# Patient Record
Sex: Female | Born: 1945 | Race: White | Hispanic: No | Marital: Married | Smoking: Current some day smoker
Health system: Southern US, Community
[De-identification: ages and names within clinical notes are randomized; demographics above are authoritative.]

## PROBLEM LIST (undated history)

## (undated) DIAGNOSIS — I639 Cerebral infarction, unspecified: Secondary | ICD-10-CM

## (undated) DIAGNOSIS — C3490 Malignant neoplasm of unspecified part of unspecified bronchus or lung: Secondary | ICD-10-CM

## (undated) DIAGNOSIS — L570 Actinic keratosis: Secondary | ICD-10-CM

## (undated) DIAGNOSIS — I10 Essential (primary) hypertension: Secondary | ICD-10-CM

## (undated) DIAGNOSIS — R569 Unspecified convulsions: Secondary | ICD-10-CM

## (undated) DIAGNOSIS — L309 Dermatitis, unspecified: Secondary | ICD-10-CM

## (undated) HISTORY — DX: Dermatitis, unspecified: L30.9

## (undated) HISTORY — DX: Unspecified convulsions: R56.9

## (undated) HISTORY — DX: Cerebral infarction, unspecified: I63.9

## (undated) HISTORY — DX: Malignant neoplasm of unspecified part of unspecified bronchus or lung: C34.90

## (undated) HISTORY — PX: CHOLECYSTECTOMY: SHX55

## (undated) HISTORY — DX: Actinic keratosis: L57.0

---

## 2009-06-28 ENCOUNTER — Observation Stay: Payer: Self-pay | Admitting: Internal Medicine

## 2009-09-11 ENCOUNTER — Ambulatory Visit: Payer: Self-pay | Admitting: Internal Medicine

## 2010-10-03 ENCOUNTER — Ambulatory Visit: Payer: Self-pay

## 2010-11-01 ENCOUNTER — Ambulatory Visit: Payer: Self-pay | Admitting: Gastroenterology

## 2011-08-27 ENCOUNTER — Ambulatory Visit: Payer: Self-pay | Admitting: Internal Medicine

## 2011-10-16 ENCOUNTER — Ambulatory Visit: Payer: Self-pay

## 2013-02-10 ENCOUNTER — Ambulatory Visit: Payer: Self-pay

## 2013-08-31 DIAGNOSIS — R300 Dysuria: Secondary | ICD-10-CM | POA: Diagnosis not present

## 2014-01-06 DIAGNOSIS — F411 Generalized anxiety disorder: Secondary | ICD-10-CM | POA: Diagnosis not present

## 2014-01-06 DIAGNOSIS — F17200 Nicotine dependence, unspecified, uncomplicated: Secondary | ICD-10-CM | POA: Diagnosis not present

## 2014-01-06 DIAGNOSIS — I10 Essential (primary) hypertension: Secondary | ICD-10-CM | POA: Diagnosis not present

## 2014-01-06 DIAGNOSIS — R5383 Other fatigue: Secondary | ICD-10-CM | POA: Diagnosis not present

## 2014-01-06 DIAGNOSIS — K589 Irritable bowel syndrome without diarrhea: Secondary | ICD-10-CM | POA: Diagnosis not present

## 2014-01-06 DIAGNOSIS — R5381 Other malaise: Secondary | ICD-10-CM | POA: Diagnosis not present

## 2014-01-07 DIAGNOSIS — Z0000 Encounter for general adult medical examination without abnormal findings: Secondary | ICD-10-CM | POA: Diagnosis not present

## 2014-01-07 DIAGNOSIS — E559 Vitamin D deficiency, unspecified: Secondary | ICD-10-CM | POA: Diagnosis not present

## 2014-01-07 DIAGNOSIS — E782 Mixed hyperlipidemia: Secondary | ICD-10-CM | POA: Diagnosis not present

## 2014-01-07 DIAGNOSIS — I10 Essential (primary) hypertension: Secondary | ICD-10-CM | POA: Diagnosis not present

## 2014-02-14 ENCOUNTER — Ambulatory Visit: Payer: Self-pay

## 2014-02-14 DIAGNOSIS — Z1231 Encounter for screening mammogram for malignant neoplasm of breast: Secondary | ICD-10-CM | POA: Diagnosis not present

## 2014-02-14 IMAGING — MG MAM DGTL SCRN MAM NO ORDER W/CAD
1 series · 5 of 5 positions shown · non-contrast
Comparison: Previous exam(s).

CLINICAL DATA: Screening.

EXAM:
DIGITAL SCREENING BILATERAL MAMMOGRAM WITH CAD

[R CC · right · 5 of 5 slices shown]
[im 1/5]
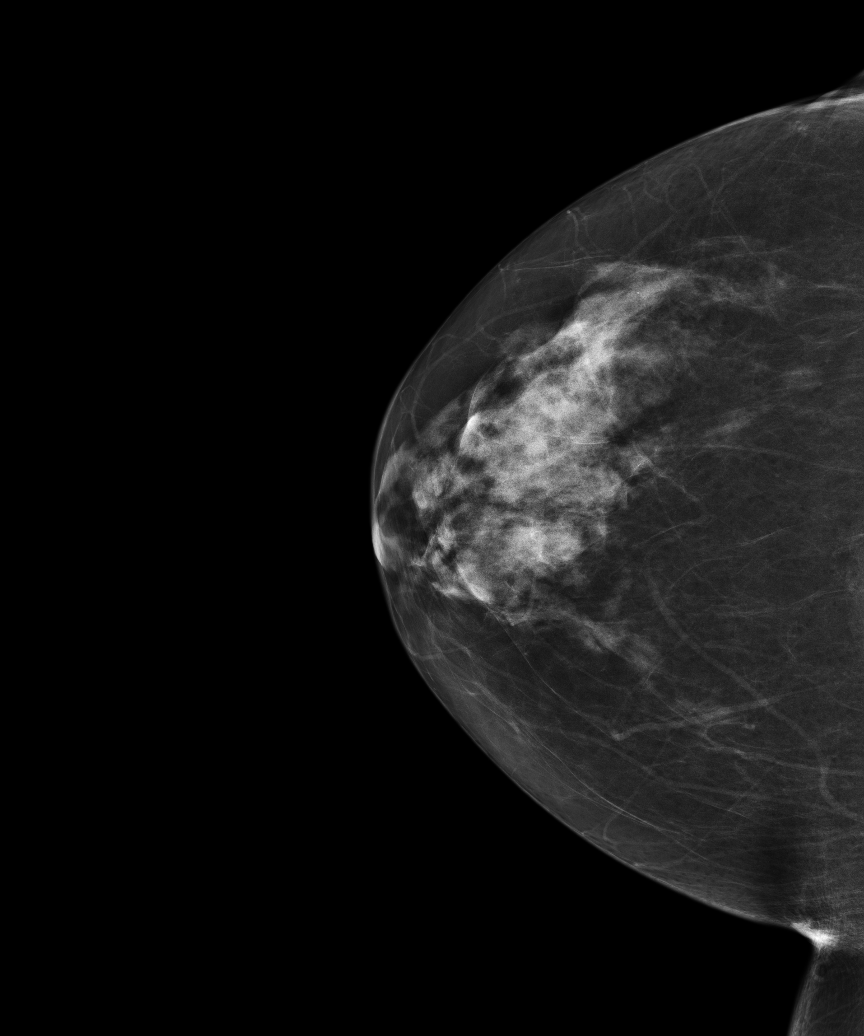
[im 2/5]
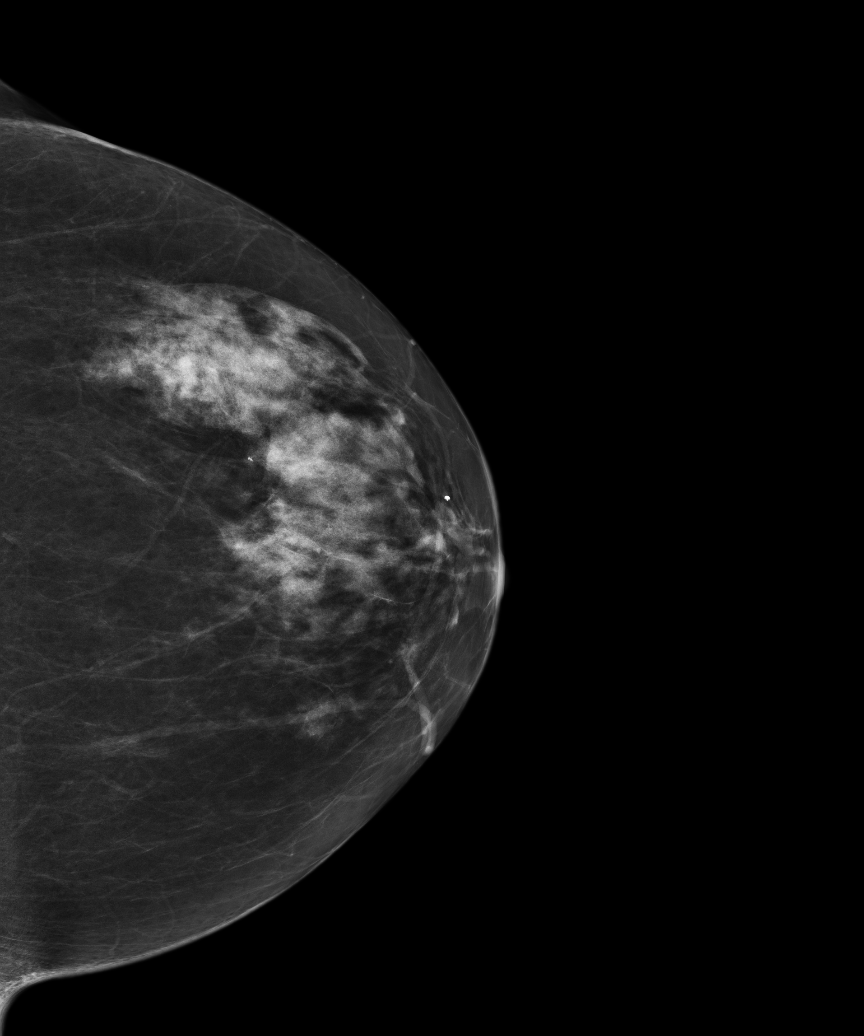
[im 3/5]
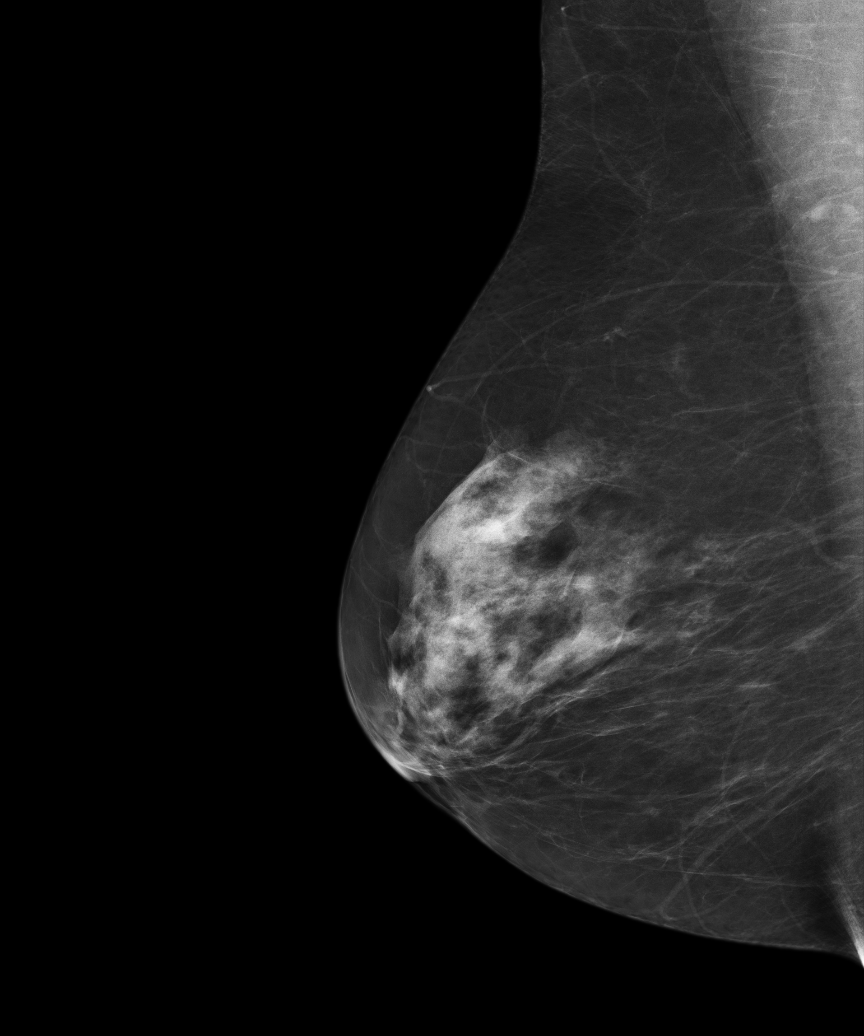
[im 4/5]
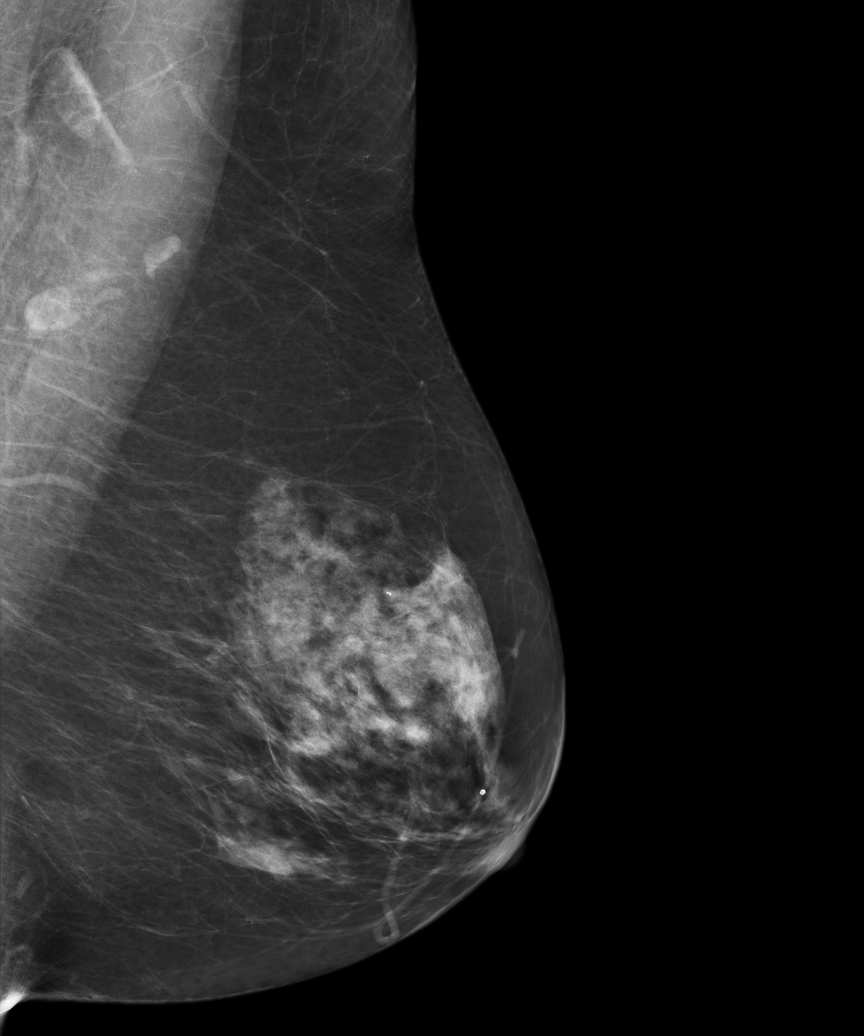
[im 5/5]
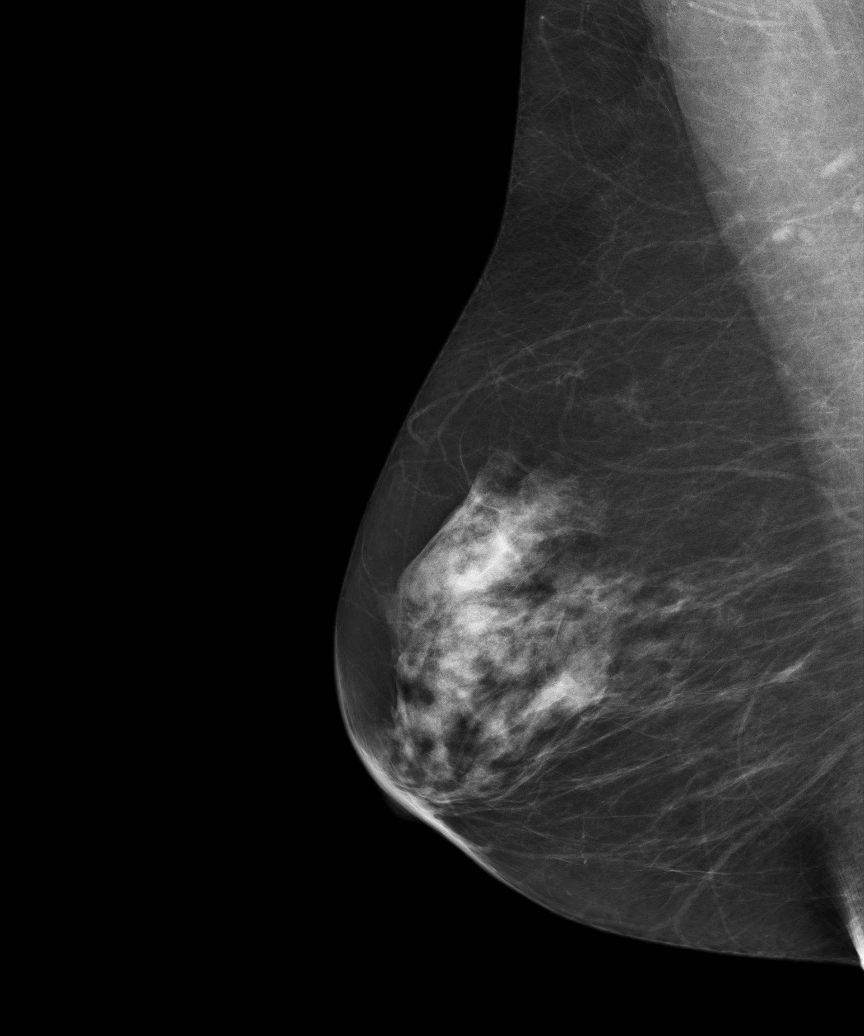

[5 of 5 positions shown; findings below may reference images not displayed]

ACR Breast Density Category c: The breast tissue is heterogeneously
dense, which may obscure small masses.
FINDINGS: There are no findings suspicious for malignancy. Images were
processed with CAD.
IMPRESSION: No mammographic evidence of malignancy. A result letter of this
screening mammogram will be mailed directly to the patient.

RECOMMENDATION:
Screening mammogram in one year. (Code:[0J])

BI-RADS CATEGORY  1: Negative.

## 2014-05-09 DIAGNOSIS — I10 Essential (primary) hypertension: Secondary | ICD-10-CM | POA: Diagnosis not present

## 2014-05-09 DIAGNOSIS — F411 Generalized anxiety disorder: Secondary | ICD-10-CM | POA: Diagnosis not present

## 2014-05-09 DIAGNOSIS — F17210 Nicotine dependence, cigarettes, uncomplicated: Secondary | ICD-10-CM | POA: Diagnosis not present

## 2014-09-12 DIAGNOSIS — Z0001 Encounter for general adult medical examination with abnormal findings: Secondary | ICD-10-CM | POA: Diagnosis not present

## 2014-09-12 DIAGNOSIS — R300 Dysuria: Secondary | ICD-10-CM | POA: Diagnosis not present

## 2014-09-12 DIAGNOSIS — F411 Generalized anxiety disorder: Secondary | ICD-10-CM | POA: Diagnosis not present

## 2014-09-12 DIAGNOSIS — I10 Essential (primary) hypertension: Secondary | ICD-10-CM | POA: Diagnosis not present

## 2014-09-12 DIAGNOSIS — F17210 Nicotine dependence, cigarettes, uncomplicated: Secondary | ICD-10-CM | POA: Diagnosis not present

## 2014-10-14 DIAGNOSIS — F411 Generalized anxiety disorder: Secondary | ICD-10-CM | POA: Diagnosis not present

## 2014-10-14 DIAGNOSIS — I10 Essential (primary) hypertension: Secondary | ICD-10-CM | POA: Diagnosis not present

## 2014-10-14 DIAGNOSIS — F17210 Nicotine dependence, cigarettes, uncomplicated: Secondary | ICD-10-CM | POA: Diagnosis not present

## 2014-11-28 DIAGNOSIS — I10 Essential (primary) hypertension: Secondary | ICD-10-CM | POA: Diagnosis not present

## 2014-11-28 DIAGNOSIS — F17210 Nicotine dependence, cigarettes, uncomplicated: Secondary | ICD-10-CM | POA: Diagnosis not present

## 2014-11-28 DIAGNOSIS — F411 Generalized anxiety disorder: Secondary | ICD-10-CM | POA: Diagnosis not present

## 2014-12-20 DIAGNOSIS — R002 Palpitations: Secondary | ICD-10-CM | POA: Diagnosis not present

## 2014-12-20 DIAGNOSIS — E782 Mixed hyperlipidemia: Secondary | ICD-10-CM | POA: Diagnosis not present

## 2014-12-20 DIAGNOSIS — Z0001 Encounter for general adult medical examination with abnormal findings: Secondary | ICD-10-CM | POA: Diagnosis not present

## 2014-12-20 DIAGNOSIS — F17210 Nicotine dependence, cigarettes, uncomplicated: Secondary | ICD-10-CM | POA: Diagnosis not present

## 2014-12-20 DIAGNOSIS — E559 Vitamin D deficiency, unspecified: Secondary | ICD-10-CM | POA: Diagnosis not present

## 2014-12-20 DIAGNOSIS — R802 Orthostatic proteinuria, unspecified: Secondary | ICD-10-CM | POA: Diagnosis not present

## 2014-12-20 DIAGNOSIS — I10 Essential (primary) hypertension: Secondary | ICD-10-CM | POA: Diagnosis not present

## 2014-12-20 DIAGNOSIS — R55 Syncope and collapse: Secondary | ICD-10-CM | POA: Diagnosis not present

## 2014-12-23 ENCOUNTER — Other Ambulatory Visit: Payer: Self-pay | Admitting: Nurse Practitioner

## 2014-12-23 DIAGNOSIS — R55 Syncope and collapse: Secondary | ICD-10-CM

## 2014-12-27 ENCOUNTER — Ambulatory Visit
Admission: RE | Admit: 2014-12-27 | Discharge: 2014-12-27 | Disposition: A | Discharge status: Home / Self Care | Payer: Medicare Other | Source: Ambulatory Visit | Attending: Nurse Practitioner | Admitting: Nurse Practitioner

## 2014-12-27 DIAGNOSIS — R55 Syncope and collapse: Secondary | ICD-10-CM

## 2014-12-28 DIAGNOSIS — R0602 Shortness of breath: Secondary | ICD-10-CM | POA: Diagnosis not present

## 2015-01-11 DIAGNOSIS — R55 Syncope and collapse: Secondary | ICD-10-CM | POA: Diagnosis not present

## 2015-01-20 DIAGNOSIS — R55 Syncope and collapse: Secondary | ICD-10-CM | POA: Diagnosis not present

## 2015-01-20 DIAGNOSIS — I10 Essential (primary) hypertension: Secondary | ICD-10-CM | POA: Diagnosis not present

## 2015-01-20 DIAGNOSIS — F17210 Nicotine dependence, cigarettes, uncomplicated: Secondary | ICD-10-CM | POA: Diagnosis not present

## 2015-01-20 DIAGNOSIS — I6529 Occlusion and stenosis of unspecified carotid artery: Secondary | ICD-10-CM | POA: Diagnosis not present

## 2015-01-25 ENCOUNTER — Other Ambulatory Visit: Payer: Self-pay | Admitting: Nurse Practitioner

## 2015-01-25 DIAGNOSIS — I6523 Occlusion and stenosis of bilateral carotid arteries: Secondary | ICD-10-CM

## 2015-01-27 ENCOUNTER — Ambulatory Visit
Admission: RE | Admit: 2015-01-27 | Discharge: 2015-01-27 | Disposition: A | Discharge status: Home / Self Care | Payer: Medicare Other | Source: Ambulatory Visit | Attending: Nurse Practitioner | Admitting: Nurse Practitioner

## 2015-01-27 DIAGNOSIS — J439 Emphysema, unspecified: Secondary | ICD-10-CM | POA: Insufficient documentation

## 2015-01-27 DIAGNOSIS — I6523 Occlusion and stenosis of bilateral carotid arteries: Secondary | ICD-10-CM | POA: Diagnosis not present

## 2015-01-27 DIAGNOSIS — I10 Essential (primary) hypertension: Secondary | ICD-10-CM | POA: Diagnosis not present

## 2015-01-27 HISTORY — DX: Essential (primary) hypertension: I10

## 2015-01-27 MED ORDER — IOHEXOL 350 MG/ML SOLN
100.0000 mL | Freq: Once | INTRAVENOUS | Status: AC | PRN
  Administered 2015-01-27: 70 mL via INTRAVENOUS

## 2015-03-27 DIAGNOSIS — F17210 Nicotine dependence, cigarettes, uncomplicated: Secondary | ICD-10-CM | POA: Diagnosis not present

## 2015-03-27 DIAGNOSIS — I6529 Occlusion and stenosis of unspecified carotid artery: Secondary | ICD-10-CM | POA: Diagnosis not present

## 2015-03-27 DIAGNOSIS — I10 Essential (primary) hypertension: Secondary | ICD-10-CM | POA: Diagnosis not present

## 2015-03-27 DIAGNOSIS — F411 Generalized anxiety disorder: Secondary | ICD-10-CM | POA: Diagnosis not present

## 2015-06-14 DIAGNOSIS — R300 Dysuria: Secondary | ICD-10-CM | POA: Diagnosis not present

## 2015-06-14 DIAGNOSIS — N390 Urinary tract infection, site not specified: Secondary | ICD-10-CM | POA: Diagnosis not present

## 2015-06-14 DIAGNOSIS — F17210 Nicotine dependence, cigarettes, uncomplicated: Secondary | ICD-10-CM | POA: Diagnosis not present

## 2015-07-25 DIAGNOSIS — I10 Essential (primary) hypertension: Secondary | ICD-10-CM | POA: Diagnosis not present

## 2015-07-25 DIAGNOSIS — F17210 Nicotine dependence, cigarettes, uncomplicated: Secondary | ICD-10-CM | POA: Diagnosis not present

## 2015-07-25 DIAGNOSIS — F411 Generalized anxiety disorder: Secondary | ICD-10-CM | POA: Diagnosis not present

## 2015-09-20 DIAGNOSIS — I10 Essential (primary) hypertension: Secondary | ICD-10-CM | POA: Diagnosis not present

## 2015-09-20 DIAGNOSIS — F411 Generalized anxiety disorder: Secondary | ICD-10-CM | POA: Diagnosis not present

## 2015-09-20 DIAGNOSIS — F17210 Nicotine dependence, cigarettes, uncomplicated: Secondary | ICD-10-CM | POA: Diagnosis not present

## 2015-09-20 DIAGNOSIS — Z0001 Encounter for general adult medical examination with abnormal findings: Secondary | ICD-10-CM | POA: Diagnosis not present

## 2015-09-21 ENCOUNTER — Other Ambulatory Visit: Payer: Self-pay | Admitting: Nurse Practitioner

## 2015-09-21 DIAGNOSIS — Z1231 Encounter for screening mammogram for malignant neoplasm of breast: Secondary | ICD-10-CM

## 2015-10-05 ENCOUNTER — Other Ambulatory Visit: Payer: Self-pay | Admitting: Nurse Practitioner

## 2015-10-05 ENCOUNTER — Ambulatory Visit
Admission: RE | Admit: 2015-10-05 | Discharge: 2015-10-05 | Disposition: A | Discharge status: Home / Self Care | Payer: Medicare Other | Source: Ambulatory Visit | Attending: Nurse Practitioner | Admitting: Nurse Practitioner

## 2015-10-05 DIAGNOSIS — E559 Vitamin D deficiency, unspecified: Secondary | ICD-10-CM | POA: Diagnosis not present

## 2015-10-05 DIAGNOSIS — Z0001 Encounter for general adult medical examination with abnormal findings: Secondary | ICD-10-CM | POA: Diagnosis not present

## 2015-10-05 DIAGNOSIS — Z1231 Encounter for screening mammogram for malignant neoplasm of breast: Secondary | ICD-10-CM | POA: Diagnosis not present

## 2015-10-05 DIAGNOSIS — E782 Mixed hyperlipidemia: Secondary | ICD-10-CM | POA: Diagnosis not present

## 2015-10-05 DIAGNOSIS — I10 Essential (primary) hypertension: Secondary | ICD-10-CM | POA: Diagnosis not present

## 2015-12-15 DIAGNOSIS — R0602 Shortness of breath: Secondary | ICD-10-CM | POA: Diagnosis not present

## 2015-12-15 DIAGNOSIS — I10 Essential (primary) hypertension: Secondary | ICD-10-CM | POA: Diagnosis not present

## 2015-12-19 DIAGNOSIS — I10 Essential (primary) hypertension: Secondary | ICD-10-CM | POA: Diagnosis not present

## 2015-12-19 DIAGNOSIS — F17210 Nicotine dependence, cigarettes, uncomplicated: Secondary | ICD-10-CM | POA: Diagnosis not present

## 2015-12-19 DIAGNOSIS — D72829 Elevated white blood cell count, unspecified: Secondary | ICD-10-CM | POA: Diagnosis not present

## 2015-12-19 DIAGNOSIS — E875 Hyperkalemia: Secondary | ICD-10-CM | POA: Diagnosis not present

## 2015-12-19 DIAGNOSIS — F411 Generalized anxiety disorder: Secondary | ICD-10-CM | POA: Diagnosis not present

## 2015-12-19 DIAGNOSIS — E785 Hyperlipidemia, unspecified: Secondary | ICD-10-CM | POA: Diagnosis not present

## 2016-01-03 ENCOUNTER — Other Ambulatory Visit: Payer: Self-pay

## 2016-04-16 DIAGNOSIS — D72829 Elevated white blood cell count, unspecified: Secondary | ICD-10-CM | POA: Diagnosis not present

## 2016-04-18 DIAGNOSIS — I10 Essential (primary) hypertension: Secondary | ICD-10-CM | POA: Diagnosis not present

## 2016-04-18 DIAGNOSIS — F411 Generalized anxiety disorder: Secondary | ICD-10-CM | POA: Diagnosis not present

## 2016-04-18 DIAGNOSIS — J019 Acute sinusitis, unspecified: Secondary | ICD-10-CM | POA: Diagnosis not present

## 2016-04-18 DIAGNOSIS — F17210 Nicotine dependence, cigarettes, uncomplicated: Secondary | ICD-10-CM | POA: Diagnosis not present

## 2016-07-22 ENCOUNTER — Ambulatory Visit
Admission: RE | Admit: 2016-07-22 | Discharge: 2016-07-22 | Disposition: A | Discharge status: Home / Self Care | Payer: Medicare Other | Source: Ambulatory Visit | Attending: Nurse Practitioner | Admitting: Nurse Practitioner

## 2016-07-22 ENCOUNTER — Other Ambulatory Visit: Payer: Self-pay | Admitting: Nurse Practitioner

## 2016-07-22 DIAGNOSIS — I10 Essential (primary) hypertension: Secondary | ICD-10-CM | POA: Diagnosis not present

## 2016-07-22 DIAGNOSIS — R059 Cough, unspecified: Secondary | ICD-10-CM

## 2016-07-22 DIAGNOSIS — R05 Cough: Secondary | ICD-10-CM

## 2016-07-22 DIAGNOSIS — F411 Generalized anxiety disorder: Secondary | ICD-10-CM | POA: Diagnosis not present

## 2016-07-22 DIAGNOSIS — F17210 Nicotine dependence, cigarettes, uncomplicated: Secondary | ICD-10-CM | POA: Diagnosis not present

## 2016-07-22 DIAGNOSIS — R042 Hemoptysis: Secondary | ICD-10-CM | POA: Diagnosis not present

## 2016-07-22 DIAGNOSIS — R918 Other nonspecific abnormal finding of lung field: Secondary | ICD-10-CM | POA: Diagnosis not present

## 2016-07-22 DIAGNOSIS — J069 Acute upper respiratory infection, unspecified: Secondary | ICD-10-CM | POA: Diagnosis not present

## 2016-07-30 ENCOUNTER — Other Ambulatory Visit: Payer: Self-pay | Admitting: Internal Medicine

## 2016-07-30 DIAGNOSIS — R042 Hemoptysis: Secondary | ICD-10-CM

## 2016-08-05 ENCOUNTER — Ambulatory Visit
Admission: RE | Admit: 2016-08-05 | Discharge: 2016-08-05 | Disposition: A | Discharge status: Home / Self Care | Payer: Medicare Other | Source: Ambulatory Visit | Attending: Internal Medicine | Admitting: Internal Medicine

## 2016-08-05 DIAGNOSIS — R918 Other nonspecific abnormal finding of lung field: Secondary | ICD-10-CM | POA: Diagnosis not present

## 2016-08-05 DIAGNOSIS — R911 Solitary pulmonary nodule: Secondary | ICD-10-CM | POA: Diagnosis not present

## 2016-08-05 DIAGNOSIS — J432 Centrilobular emphysema: Secondary | ICD-10-CM | POA: Diagnosis not present

## 2016-08-05 DIAGNOSIS — R590 Localized enlarged lymph nodes: Secondary | ICD-10-CM | POA: Insufficient documentation

## 2016-08-05 DIAGNOSIS — I700 Atherosclerosis of aorta: Secondary | ICD-10-CM | POA: Insufficient documentation

## 2016-08-05 DIAGNOSIS — R042 Hemoptysis: Secondary | ICD-10-CM | POA: Insufficient documentation

## 2016-08-05 LAB — POCT I-STAT CREATININE: Creatinine, Ser: 0.8 mg/dL (ref 0.44–1.00)

## 2016-08-05 MED ORDER — IOPAMIDOL (ISOVUE-300) INJECTION 61%
75.0000 mL | Freq: Once | INTRAVENOUS | Status: AC | PRN
  Administered 2016-08-05: 75 mL via INTRAVENOUS

## 2016-08-06 DIAGNOSIS — F17210 Nicotine dependence, cigarettes, uncomplicated: Secondary | ICD-10-CM | POA: Diagnosis not present

## 2016-08-06 DIAGNOSIS — D381 Neoplasm of uncertain behavior of trachea, bronchus and lung: Secondary | ICD-10-CM | POA: Diagnosis not present

## 2016-08-06 DIAGNOSIS — R0602 Shortness of breath: Secondary | ICD-10-CM | POA: Diagnosis not present

## 2016-08-14 DIAGNOSIS — R0602 Shortness of breath: Secondary | ICD-10-CM | POA: Diagnosis not present

## 2016-08-19 ENCOUNTER — Other Ambulatory Visit: Payer: Self-pay | Admitting: Internal Medicine

## 2016-08-19 DIAGNOSIS — D381 Neoplasm of uncertain behavior of trachea, bronchus and lung: Secondary | ICD-10-CM

## 2016-08-22 ENCOUNTER — Encounter
Admission: RE | Admit: 2016-08-22 | Discharge: 2016-08-22 | Disposition: A | Discharge status: Home / Self Care | Payer: Medicare Other | Source: Ambulatory Visit | Attending: Internal Medicine | Admitting: Internal Medicine

## 2016-08-22 ENCOUNTER — Other Ambulatory Visit: Payer: Self-pay | Admitting: Internal Medicine

## 2016-08-22 DIAGNOSIS — D381 Neoplasm of uncertain behavior of trachea, bronchus and lung: Secondary | ICD-10-CM | POA: Diagnosis not present

## 2016-08-22 DIAGNOSIS — C3432 Malignant neoplasm of lower lobe, left bronchus or lung: Secondary | ICD-10-CM

## 2016-08-22 DIAGNOSIS — R911 Solitary pulmonary nodule: Secondary | ICD-10-CM | POA: Diagnosis not present

## 2016-08-22 LAB — GLUCOSE, CAPILLARY: Glucose-Capillary: 96 mg/dL (ref 65–99)

## 2016-08-22 MED ORDER — FLUDEOXYGLUCOSE F - 18 (FDG) INJECTION
12.8500 | Freq: Once | INTRAVENOUS | Status: AC | PRN
  Administered 2016-08-22: 12.85 via INTRAVENOUS

## 2016-08-27 ENCOUNTER — Other Ambulatory Visit: Payer: Self-pay | Admitting: Radiology

## 2016-08-28 ENCOUNTER — Other Ambulatory Visit: Payer: Self-pay | Admitting: Radiology

## 2016-08-28 ENCOUNTER — Other Ambulatory Visit: Payer: Self-pay | Admitting: General Surgery

## 2016-08-29 ENCOUNTER — Ambulatory Visit
Admission: RE | Admit: 2016-08-29 | Discharge: 2016-08-29 | Disposition: A | Discharge status: Home / Self Care | Payer: Medicare Other | Source: Ambulatory Visit | Attending: Internal Medicine | Admitting: Internal Medicine

## 2016-08-29 ENCOUNTER — Ambulatory Visit
Admission: RE | Admit: 2016-08-29 | Discharge: 2016-08-29 | Disposition: A | Discharge status: Home / Self Care | Payer: Medicare Other | Source: Ambulatory Visit | Attending: Interventional Radiology | Admitting: Interventional Radiology

## 2016-08-29 DIAGNOSIS — Z9889 Other specified postprocedural states: Secondary | ICD-10-CM

## 2016-08-29 DIAGNOSIS — I2699 Other pulmonary embolism without acute cor pulmonale: Secondary | ICD-10-CM | POA: Diagnosis not present

## 2016-08-29 DIAGNOSIS — C3432 Malignant neoplasm of lower lobe, left bronchus or lung: Secondary | ICD-10-CM | POA: Insufficient documentation

## 2016-08-29 DIAGNOSIS — I10 Essential (primary) hypertension: Secondary | ICD-10-CM | POA: Diagnosis not present

## 2016-08-29 DIAGNOSIS — F17210 Nicotine dependence, cigarettes, uncomplicated: Secondary | ICD-10-CM | POA: Diagnosis not present

## 2016-08-29 DIAGNOSIS — R911 Solitary pulmonary nodule: Secondary | ICD-10-CM | POA: Diagnosis not present

## 2016-08-29 LAB — CBC
HCT: 45.2 % (ref 35.0–47.0)
HEMOGLOBIN: 15.5 g/dL (ref 12.0–16.0)
MCH: 31.7 pg (ref 26.0–34.0)
MCHC: 34.3 g/dL (ref 32.0–36.0)
MCV: 92.3 fL (ref 80.0–100.0)
Platelets: 179 10*3/uL (ref 150–440)
RBC: 4.9 MIL/uL (ref 3.80–5.20)
RDW: 12.5 % (ref 11.5–14.5)
WBC: 8.3 10*3/uL (ref 3.6–11.0)

## 2016-08-29 LAB — PROTIME-INR
INR: 0.93
PROTHROMBIN TIME: 12.5 s (ref 11.4–15.2)

## 2016-08-29 LAB — APTT: aPTT: 36 seconds (ref 24–36)

## 2016-08-29 MED ORDER — SODIUM CHLORIDE 0.9 % IV SOLN
INTRAVENOUS | Status: DC
Start: 2016-08-29 — End: 2016-08-30
  Administered 2016-08-29: 09:00:00 via INTRAVENOUS

## 2016-08-29 MED ORDER — MIDAZOLAM HCL 2 MG/2ML IJ SOLN
INTRAMUSCULAR | Status: AC | PRN
  Administered 2016-08-29 (×2): 1 mg via INTRAVENOUS

## 2016-08-29 MED ORDER — FENTANYL CITRATE (PF) 100 MCG/2ML IJ SOLN
INTRAMUSCULAR | Status: AC | PRN
  Administered 2016-08-29 (×2): 50 ug via INTRAVENOUS

## 2016-08-29 NOTE — Discharge Instructions (Signed)
Lung Biopsy  A lung biopsy is a procedure to remove a tissue sample from the lung. The tissue can be examined under a microscope to help diagnose various lung disorders.  There are three types of lung biopsies:   Needle biopsy. In this type, the sample is removed with a needle.   Bronchoscopy. In this type, the sample is removed through a flexible tube inserted into the lungs (bronchoscope).   Open biopsy. In this type, the sample is removed through an incision made in the chest.    Tell a health care provider about:   Any allergies you have.   All medicines you are taking, including vitamins, herbs, eye drops, creams, and over-the-counter medicines.   Any problems you or family members have had with anesthetic medicines.   Any blood disorders or bleeding problems that you have.   Any surgeries you have had.   Any medical conditions you have.   Whether you are pregnant or may be pregnant.  What are the risks?  Generally, this is a safe procedure. However, problems may occur, including:   Collapsed lung.   Bleeding.   Infection.   Pain.    What happens before the procedure?  Staying hydrated  Follow instructions from your health care provider about hydration, which may include:   Up to 2 hours before the procedure - you may continue to drink clear liquids, such as water, clear fruit juice, black coffee, and plain tea.    Eating and drinking restrictions  Follow instructions from your health care provider about eating and drinking, which may include:   8 hours before the procedure - stop eating heavy meals or foods such as meat, fried foods, or fatty foods.   6 hours before the procedure - stop eating light meals or foods, such as toast or cereal.   6 hours before the procedure - stop drinking milk or drinks that contain milk.   2 hours before the procedure - stop drinking clear liquids.    General instructions   Ask your health care provider about:  ? Changing or stopping your regular medicines.  This is especially important if you take diabetes medicines or blood thinners.  ? Taking medicines such as aspirin and ibuprofen. These medicines can thin your blood. Do not take these medicines before your procedure if your health care provider instructs you not to.   Plan to have someone take you home from the hospital or clinic.  What happens during the procedure?   To lower your risk of infection:  ? Your health care team will wash or sanitize their hands.  ? If you are having an open biopsy, your skin will be washed with soap.   An IV tube may be inserted into one of your veins.   You may be given one or both of the following:  ? A medicine to help you relax (sedative) during the procedure.  ? A medicine to numb the area where the biopsy sample will be taken (local anesthetic).  ? A medicine to make you sleep through the procedure (general anesthetic).   If you have a needle biopsy:  ? A biopsy needle will be inserted into your lung. A CT scanner may be used to guide the needle to the right place.  ? The needle will be used to collect the tissue sample.   If you have bronchoscopy:  ? A bronchoscope will be inserted into your lungs through your mouth or nose.  ? A   needle or forceps will be passed through the bronchoscope to remove the tissue sample.   If you have an open biopsy:  ? An incision will be made in your chest.  ? The tissue sample will be removed using surgical tools.  ? The incision will be closed with skin glue, skin adhesive strips, or stitches (sutures).  The procedure may vary among health care providers and hospitals.  What happens after the procedure?   Your blood pressure, heart rate, breathing rate, and blood oxygen level will be monitored until the medicines you were given have worn off.   If a needle biopsy was performed, a bandage (dressing) will be applied over the area where the needle was inserted. You may be asked to apply pressure to the bandage for several minutes to ensure  there is minimal bleeding.   If a bronchoscope was used, you may have a cough and some soreness in your throat.   Do not drive for 24 hours if you were given a sedative.  Summary   A lung biopsy is a procedure to remove a tissue sample from your lung. The sample is examined to help diagnose various lung disorders.   A lung biopsy may be done using a needle, by inserting a tube into the lungs, or through an incision in the chest.   After your lung biopsy, you may have a cough and some throat soreness.  This information is not intended to replace advice given to you by your health care provider. Make sure you discuss any questions you have with your health care provider.  Document Released: 07/04/2004 Document Revised: 03/06/2016 Document Reviewed: 03/06/2016  Elsevier Interactive Patient Education  2017 [employer]

## 2016-08-29 NOTE — Procedures (Signed)
Interventional Radiology Procedure Note  Procedure: CT guided biopsy of LLL pulmonary nodule. Complications: Intraprocedural PTX, treated with aspiration and pleural blood patch Recommendations: - Bedrest until CXR cleared.  Minimize talking, coughing or otherwise straining.  - Follow up 1 hr CXR pending   Signed,  Criselda Peaches, MD

## 2016-08-29 NOTE — OR Nursing (Signed)
Dr Laurence Ferrari reviewed chest xray, oked to feed pt

## 2016-08-29 NOTE — H&P (Signed)
Chief Complaint: Patient was seen in consultation today for pulmonary nodule at the request of Jemison A  Referring Physician(s): Khan,Saadat A  Patient Status: [facility] - Out-pt  History of Present Illness: Monica Collins is a 71 y.o. female active smoker with a 20+ pack-year history of smoking who recently had a chest xray for cough and hemoptysis.  CXR findings prompted a chest CT which showed a LLL nodule and suspicious left hilar adenopathy.  Subsequent PET-CT confirmed hypermetabolism.  She presents today for biopsy.  She was not aware of the results of her recent imaging studies.  I explained her imaging findings and the concern for lung cancer to her and her husband.    She is feeling well today, no new or systemic complaints.   Past Medical History:  Diagnosis Date  . Hypertension     Past Surgical History:  Procedure Laterality Date  . CHOLECYSTECTOMY      Allergies: Latex  Medications: Prior to Admission medications   Medication Sig Start Date End Date Taking? Authorizing Provider  atenolol (TENORMIN) 25 MG tablet Take 25 mg by mouth daily.   Yes Historical Provider, MD  Multiple Vitamin (MULTIVITAMIN) capsule Take 1 capsule by mouth daily.   Yes Historical Provider, MD     History reviewed. No pertinent family history.  Social History   Social History  . Marital status: Married    Spouse name: N/A  . Number of children: N/A  . Years of education: N/A   Social History Main Topics  . Smoking status: Current Every Day Smoker    Packs/day: 1.00    Years: 46.00    Types: Cigarettes  . Smokeless tobacco: Never Used  . Alcohol use None     Comment: once a year  . Drug use: Unknown  . Sexual activity: Not Asked   Other Topics Concern  . None   Social History Narrative  . None    ECOG Status: 1 - Symptomatic but completely ambulatory  Review of Systems: A 12 point ROS discussed and pertinent positives are indicated in the HPI above.  All other  systems are negative.  Review of Systems  Vital Signs: BP (!) 136/45   Pulse (!) 51   Temp 97.7 F (36.5 C)   Resp 16   SpO2 97%   Physical Exam  Mallampati Score:     Imaging: Ct Chest W Contrast  Result Date: 08/05/2016 CLINICAL DATA:  71 year old female with history of cough, congestion, intermittent shortness of breath and fatigue for proximally 2-3 weeks. Current smoker (20 years). EXAM: CT CHEST WITH CONTRAST TECHNIQUE: Multidetector CT imaging of the chest was performed during intravenous contrast administration. CONTRAST:  53m ISOVUE-300 IOPAMIDOL (ISOVUE-300) INJECTION 61% COMPARISON:  No priors. FINDINGS: Cardiovascular: Heart size is normal. There is no significant pericardial fluid, thickening or pericardial calcification. Aortic atherosclerosis, without evidence of aneurysm or dissection. No coronary artery calcifications. Mediastinum/Nodes: Left infrahilar lymph nodes measuring up to 11 mm in short axis. No mediastinal or right hilar lymphadenopathy. Lungs/Pleura: In the posterior aspect of the left lower lobe there is an aggressive appearing mass measuring 2.6 x 2.2 x 3.6 cm (axial images 94 and 85 of series 3, and sagittal image 106 of series 6) which has macrolobulated and spiculated margins making contact with the overlying pleura, highly concerning for primary bronchogenic neoplasm. At this time there is no associated pleural effusion. 4 mm nodule in the superior segment of the right lower lobe (image 62 of series  3). No other larger more suspicious appearing pulmonary nodules or masses are noted. No acute consolidative airspace disease. No pleural effusions. Mild diffuse bronchial wall thickening with mild centrilobular and paraseptal emphysema. Upper Abdomen: Extensive aortic atherosclerosis. Status post cholecystectomy. Diffuse low attenuation throughout the visualized hepatic parenchyma, suspicious for hepatic steatosis (difficult to say for certain on today's contrast  enhanced examination). Musculoskeletal: There are no aggressive appearing lytic or blastic lesions noted in the visualized portions of the skeleton. IMPRESSION: 1. 2.6 x 2.2 x 3.6 cm aggressive appearing mass in the posteromedial aspect of the left lower lobe with associated left infrahilar lymphadenopathy, highly concerning for primary bronchogenic neoplasm. Further evaluation with PET-CT is strongly recommended in the near future for additional diagnostic and staging purposes. Please note that at this time, the lesion is in contact with the overlying pleura, however, there is no pleural effusion currently. 2. 4 mm nodule in the superior segment of the right lower lobe is nonspecific. Although a solitary metastatic lesion is not excluded, it is not strongly suspected on the basis of this finding. Attention at time of followup examinations is recommended to ensure the stability of this lesion. 3. Mild diffuse bronchial wall thickening with mild centrilobular and paraseptal emphysema; imaging findings suggestive of underlying COPD. 4. Aortic atherosclerosis. 5. Probable hepatic steatosis. These results will be called to the ordering clinician or representative by the Radiologist Assistant, and communication documented in the PACS or zVision Dashboard. Electronically Signed   By: Vinnie Langton M.D.   On: 08/05/2016 09:13   Nm Pet Image Initial (pi) Skull Base To Thigh  Result Date: 08/22/2016 CLINICAL DATA:  Initial treatment strategy for left lower lobe lung mass. EXAM: NUCLEAR MEDICINE PET SKULL BASE TO THIGH TECHNIQUE: 12.9 mCi F-18 FDG was injected intravenously. Full-ring PET imaging was performed from the skull base to thigh after the radiotracer. CT data was obtained and used for attenuation correction and anatomic localization. FASTING BLOOD GLUCOSE:  Value: 96 mg/dl COMPARISON:  Chest CT on 08/05/2016 FINDINGS: NECK No hypermetabolic lymph nodes in the neck. CHEST 2.3 cm spiculated nodule in the  posterior left lower lobe is hypermetabolic, with SUV max of 11.2. 5 mm pulmonary nodule in the right lower lobe is stable and shows no FDG uptake. No evidence of pleural effusion. Mild emphysema. Left infrahilar lymphadenopathy is seen measuring 1.7 cm, with SUV max of 15.7. No hypermetabolic mediastinal, right hilar, or supraclavicular lymph nodes identified. ABDOMEN/PELVIS No abnormal hypermetabolic activity within the liver, pancreas, adrenal glands, or spleen. No hypermetabolic lymph nodes in the abdomen or pelvis. Prior cholecystectomy noted. No evidence of biliary dilatation. Aortic atherosclerosis. SKELETON No focal hypermetabolic activity to suggest skeletal metastasis. IMPRESSION: 2.3 cm hypermetabolic spiculated nodule in posterior left lower lobe, consistent with primary bronchogenic carcinoma. Hypermetabolic left hilar lymphadenopathy, consistent with metastatic disease. No other hypermetabolic thoracic lymph nodes, or distant metastatic disease. Stable 5 mm right lower lobe pulmonary nodule without FDG uptake, likely benign in etiology. Recommend continued attention on follow-up CT. Electronically Signed   By: Earle Gell M.D.   On: 08/22/2016 15:17    Labs:  CBC:  Recent Labs  08/29/16 0815  WBC 8.3  HGB 15.5  HCT 45.2  PLT 179    COAGS:  Recent Labs  08/29/16 0815  INR 0.93  APTT 36    BMP:  Recent Labs  08/05/16 0839  CREATININE 0.80    LIVER FUNCTION TESTS: No results for input(s): BILITOT, AST, ALT, ALKPHOS, PROT, ALBUMIN in the  last 8760 hours.  TUMOR MARKERS: No results for input(s): AFPTM, CEA, CA199, CHROMGRNA in the last 8760 hours.  Assessment and Plan:  Active smoker with an FDG-avid LLL pulmonary nodule highly concerning for primary bronchogenic carcinoma.  Prior imaging reviewed by me.  CT-guided tissue sampling is warranted.  I explained the risks (pneumothorax, hemoptysis, air embolism, stroke and death), benefits and alternatives in detail.  I  answered all their questions.  They understand and desire to proceed.   Will proceed with biopsy.   Thank you for this interesting consult.  I greatly enjoyed meeting Monica Collins and look forward to participating in their care.  A copy of this report was sent to the requesting provider on this date.  Electronically Signed: [place], [place] 08/29/2016, 9:03 AM   I spent a total of  15 Minutes  in face to face in clinical consultation, greater than 50% of which was counseling/coordinating care for left lower lobe pulmonary nodule.

## 2016-08-30 ENCOUNTER — Other Ambulatory Visit: Payer: Self-pay | Admitting: Pathology

## 2016-08-30 LAB — SURGICAL PATHOLOGY

## 2016-09-02 DIAGNOSIS — C3432 Malignant neoplasm of lower lobe, left bronchus or lung: Secondary | ICD-10-CM | POA: Diagnosis not present

## 2016-09-02 DIAGNOSIS — F17210 Nicotine dependence, cigarettes, uncomplicated: Secondary | ICD-10-CM | POA: Diagnosis not present

## 2016-09-02 DIAGNOSIS — J449 Chronic obstructive pulmonary disease, unspecified: Secondary | ICD-10-CM | POA: Diagnosis not present

## 2016-09-04 DIAGNOSIS — C3432 Malignant neoplasm of lower lobe, left bronchus or lung: Secondary | ICD-10-CM | POA: Insufficient documentation

## 2016-09-04 NOTE — Progress Notes (Deleted)
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  INTERVAL HISTORY: ***  REVIEW OF SYSTEMS:   ROS  As per HPI. Otherwise, a complete review of systems is negative.  PAST MEDICAL HISTORY: Past Medical History:  Diagnosis Date  . Hypertension     PAST SURGICAL HISTORY: Past Surgical History:  Procedure Laterality Date  . CHOLECYSTECTOMY      FAMILY HISTORY: No family history on file.  ADVANCED DIRECTIVES (Y/N):  N  HEALTH MAINTENANCE: Social History  Substance Use Topics  . Smoking status: Current Every Day Smoker    Packs/day: 1.00    Years: 46.00    Types: Cigarettes  . Smokeless tobacco: Never Used  . Alcohol use Not on file     Comment: once a year     Colonoscopy:  PAP:  Bone density:  Lipid panel:  Allergies  Allergen Reactions  . Latex Rash    Rubber cleaning gloves used to clean home. But less reactive in recent years    Current Outpatient Prescriptions  Medication Sig Dispense Refill  . atenolol (TENORMIN) 25 MG tablet Take 25 mg by mouth daily.    . Multiple Vitamin (MULTIVITAMIN) capsule Take 1 capsule by mouth daily.     No current facility-administered medications for this visit.     OBJECTIVE: There were no vitals filed for this visit.   There is no height or weight on file to calculate BMI.    ECOG FS:{CHL ONC [id number]  General: Well-developed, well-nourished, no acute distress. Eyes: Pink conjunctiva, anicteric sclera. HEENT: Normocephalic, moist mucous membranes, clear oropharnyx. Lungs: Clear to auscultation bilaterally. Heart: Regular rate and rhythm. No rubs, murmurs, or gallops. Abdomen: Soft, nontender, nondistended. No organomegaly noted, normoactive bowel  sounds. Musculoskeletal: No edema, cyanosis, or clubbing. Neuro: Alert, answering all questions appropriately. Cranial nerves grossly intact. Skin: No rashes or petechiae noted. Psych: Normal affect. Lymphatics: No cervical, calvicular, axillary or inguinal LAD.   LAB RESULTS:  Lab Results  Component Value Date   CREATININE 0.80 08/05/2016    Lab Results  Component Value Date   WBC 8.3 08/29/2016   HGB 15.5 08/29/2016   HCT 45.2 08/29/2016   MCV 92.3 08/29/2016   PLT 179 08/29/2016     STUDIES: Nm Pet Image Initial (pi) Skull Base To Thigh  Result Date: 08/22/2016 CLINICAL DATA:  Initial treatment strategy for left lower lobe lung mass. EXAM: NUCLEAR MEDICINE PET SKULL BASE TO THIGH TECHNIQUE: 12.9 mCi F-18 FDG was injected intravenously. Full-ring PET imaging was performed from the skull base to thigh after the radiotracer. CT data was obtained and used for attenuation correction and anatomic localization. FASTING BLOOD GLUCOSE:  Value: 96 mg/dl COMPARISON:  Chest CT on 08/05/2016 FINDINGS: NECK No hypermetabolic lymph nodes in the neck. CHEST 2.3 cm spiculated nodule in the posterior left lower lobe is hypermetabolic, with SUV max of 11.2. 5 mm pulmonary nodule in the right lower lobe is stable and shows no FDG uptake. No evidence of pleural effusion. Mild emphysema. Left infrahilar lymphadenopathy is seen measuring 1.7 cm, with SUV max of 15.7. No hypermetabolic mediastinal, right hilar, or supraclavicular lymph nodes identified. ABDOMEN/PELVIS No abnormal hypermetabolic activity within the liver, pancreas, adrenal glands, or spleen. No hypermetabolic lymph nodes in the abdomen or pelvis.  Prior cholecystectomy noted. No evidence of biliary dilatation. Aortic atherosclerosis. SKELETON No focal hypermetabolic activity to suggest skeletal metastasis. IMPRESSION: 2.3 cm hypermetabolic spiculated nodule in posterior left lower lobe, consistent with primary bronchogenic carcinoma.  Hypermetabolic left hilar lymphadenopathy, consistent with metastatic disease. No other hypermetabolic thoracic lymph nodes, or distant metastatic disease. Stable 5 mm right lower lobe pulmonary nodule without FDG uptake, likely benign in etiology. Recommend continued attention on follow-up CT. Electronically Signed   By: Earle Gell M.D.   On: 08/22/2016 15:17   Ct Biopsy  Result Date: 08/29/2016 INDICATION: 71 year old female with an FDG avid nodule in the left lower lobe concerning for primary bronchogenic carcinoma EXAM: CT-guided biopsy left lower lobe lobe pulmonary nodule Interventional Radiologist:  Criselda Peaches, MD MEDICATIONS: None. ANESTHESIA/SEDATION: Fentanyl 100 mcg IV; Versed 2 mg IV Moderate Sedation Time:  34 minutes The patient was continuously monitored during the procedure by the interventional radiology nurse under my direct supervision. FLUOROSCOPY TIME:  Fluoroscopy Time: 0 minutes 0 seconds (0 mGy). COMPLICATIONS: SIR Level A - No therapy, no consequence. Small intra procedural pneumothorax treated by aspiration and application of a pleural blood patch. No evidence of recurrent pneumothorax on the 2 hour follow-up chest radiograph. Patient discharged home in good condition. Estimated blood loss:  0 PROCEDURE: Informed written consent was obtained from the patient after a thorough discussion of the procedural risks, benefits and alternatives. All questions were addressed. Maximal Sterile Barrier Technique was utilized including caps, mask, sterile gowns, sterile gloves, sterile drape, hand hygiene and skin antiseptic. A timeout was performed prior to the initiation of the procedure. A planning axial CT scan was performed. The nodule in the left lower lobe was successfully identified. A suitable skin entry site was selected and marked. The region was then sterilely prepped and draped in standard fashion using Betadine skin prep. Local anesthesia was attained by infiltration with 1%  lidocaine. A small dermatotomy was made. Under intermittent CT fluoroscopic guidance, a 17 gauge trocar needle was advanced into the lung and positioned at the margin of the nodule. There was almost immediate development of a small pneumothorax. Therefore, a 5 [place] Yueh centesis catheter was advanced through a separate access over rib and into the pleural space. The Yueh catheter was advanced into the pleural space and aspiration performed in total there was complete re-expansion of the lung. At that time, the 17 gauge trocar needle was readvanced into the lung and positioned at the margin of the nodule. Multiple 18 gauge core biopsies were then coaxially obtained using the BioPince automated biopsy device. Biopsy specimens were placed in formalin and delivered to pathology for further analysis. The biopsy device and introducer needle were removed. 20 mL of autologous blood was drawn from the patient's IV and injected through the Yueh centesis Catheter to form a pleural blood patch. Post biopsy axial CT imaging demonstrates no significant residual pneumothorax. Mild perilesional alveolar hemorrhage is not unexpected. The patient tolerated the procedure well. IMPRESSION: Technically successful CT-guided biopsy left lower lobe pulmonary nodule. Signed, Criselda Peaches, MD [facility] Radiology Electronically Signed   By: Jacqulynn Cadet M.D.   On: 08/29/2016 17:11   Dg Chest Port 1 View  Result Date: 08/29/2016 CLINICAL DATA:  Status post biopsy of the left lower lobe pulmonary nodule today. EXAM: PORTABLE CHEST 1 VIEW COMPARISON:  CT chest 08/22/2016. FINDINGS: No pneumothorax is identified after biopsy. Left lower lobe pulmonary nodule is again seen. Heart size is normal.  No pleural effusion. IMPRESSION: Negative for pneumothorax after biopsy of a left lower lobe pulmonary nodule. Electronically Signed   By: Inge Rise M.D.   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 She also understands that She can call clinic at any time with any questions, concerns, or complaints.   Cancer Staging Primary adenocarcinoma of lower lobe of left lung [facility]) Staging form: Lung, AJCC 8th Edition - Clinical stage from 09/04/2016: Stage IIB (cT1c, cN1, cM0) - Signed by Lloyd Huger, MD on 09/04/2016   Lloyd Huger, MD   09/04/2016 11:35 PM

## 2016-09-05 ENCOUNTER — Encounter: Payer: Self-pay | Admitting: *Deleted

## 2016-09-05 ENCOUNTER — Telehealth: Payer: Self-pay | Admitting: *Deleted

## 2016-09-05 NOTE — Progress Notes (Signed)
  Oncology Nurse Navigator Documentation  Navigator Location: [facility]-Med Onc (09/05/16 1000) Referral date to RadOnc/MedOnc: 09/04/16 (09/05/16 1000) )Navigator Encounter Type: Introductory phone call (09/05/16 1000)   Abnormal Finding Date: 07/22/16 (09/05/16 1000) Confirmed Diagnosis Date: 08/30/16 (09/05/16 1000)                   Barriers/Navigation Needs: No barriers at this time (09/05/16 1000)                Acuity: Level 2 (09/05/16 1000)   Acuity Level 2: Initial guidance, education and coordination as needed;Assistance expediting appointments (09/05/16 1000)    phone call made to patient to introduce to navigator services. Patient made aware of initial consult appt with Dr. Grayland Ormond in [place] on 5/11 at 9:30am. Pt is agreeable to appointment and also stated would like to seek second opinion as well after meeting with Dr. Grayland Ormond. Informed pt to arrive a few minutes early to fill out paperwork and to bring insurance cards and drivers license. Contact information given to patient and instructed to call if has any further needs. Informed pt that will not get to meet with her at her appt tomorrow but will continue to follow her chart. Pt verbalized understanding.   Time Spent with Patient: 15 (09/05/16 1000)

## 2016-09-05 NOTE — Telephone Encounter (Signed)
Pt called to cancel appt with Dr. Grayland Ormond on Friday May 11th. Pt stated wishes to pursue treatment at [facility]. Informed pt to call back if needs another appointment. Pt verbalized understanding.

## 2016-09-06 ENCOUNTER — Ambulatory Visit: Payer: Medicare Other | Admitting: Oncology

## 2016-09-11 DIAGNOSIS — D381 Neoplasm of uncertain behavior of trachea, bronchus and lung: Secondary | ICD-10-CM | POA: Diagnosis not present

## 2016-09-11 DIAGNOSIS — C3432 Malignant neoplasm of lower lobe, left bronchus or lung: Secondary | ICD-10-CM | POA: Diagnosis not present

## 2016-09-17 DIAGNOSIS — C3432 Malignant neoplasm of lower lobe, left bronchus or lung: Secondary | ICD-10-CM | POA: Diagnosis not present

## 2016-09-18 DIAGNOSIS — C3432 Malignant neoplasm of lower lobe, left bronchus or lung: Secondary | ICD-10-CM | POA: Insufficient documentation

## 2016-09-19 DIAGNOSIS — C3432 Malignant neoplasm of lower lobe, left bronchus or lung: Secondary | ICD-10-CM | POA: Diagnosis not present

## 2016-09-19 DIAGNOSIS — R0981 Nasal congestion: Secondary | ICD-10-CM | POA: Diagnosis not present

## 2016-09-19 DIAGNOSIS — Z91040 Latex allergy status: Secondary | ICD-10-CM | POA: Diagnosis not present

## 2016-09-19 DIAGNOSIS — Z79899 Other long term (current) drug therapy: Secondary | ICD-10-CM | POA: Diagnosis not present

## 2016-09-19 DIAGNOSIS — C771 Secondary and unspecified malignant neoplasm of intrathoracic lymph nodes: Secondary | ICD-10-CM | POA: Diagnosis not present

## 2016-09-19 DIAGNOSIS — C3490 Malignant neoplasm of unspecified part of unspecified bronchus or lung: Secondary | ICD-10-CM | POA: Diagnosis not present

## 2016-09-19 DIAGNOSIS — R5383 Other fatigue: Secondary | ICD-10-CM | POA: Diagnosis not present

## 2016-09-19 DIAGNOSIS — Z87891 Personal history of nicotine dependence: Secondary | ICD-10-CM | POA: Diagnosis not present

## 2016-09-19 DIAGNOSIS — J432 Centrilobular emphysema: Secondary | ICD-10-CM | POA: Diagnosis not present

## 2016-09-19 DIAGNOSIS — R911 Solitary pulmonary nodule: Secondary | ICD-10-CM | POA: Diagnosis not present

## 2016-09-19 DIAGNOSIS — I700 Atherosclerosis of aorta: Secondary | ICD-10-CM | POA: Diagnosis not present

## 2016-09-19 DIAGNOSIS — I10 Essential (primary) hypertension: Secondary | ICD-10-CM | POA: Diagnosis not present

## 2016-09-19 DIAGNOSIS — Z7982 Long term (current) use of aspirin: Secondary | ICD-10-CM | POA: Diagnosis not present

## 2016-10-03 DIAGNOSIS — C3432 Malignant neoplasm of lower lobe, left bronchus or lung: Secondary | ICD-10-CM | POA: Diagnosis not present

## 2016-10-08 DIAGNOSIS — C3432 Malignant neoplasm of lower lobe, left bronchus or lung: Secondary | ICD-10-CM | POA: Diagnosis not present

## 2016-10-11 DIAGNOSIS — I609 Nontraumatic subarachnoid hemorrhage, unspecified: Secondary | ICD-10-CM | POA: Diagnosis not present

## 2016-10-11 DIAGNOSIS — C3432 Malignant neoplasm of lower lobe, left bronchus or lung: Secondary | ICD-10-CM | POA: Diagnosis not present

## 2016-10-16 DIAGNOSIS — C3432 Malignant neoplasm of lower lobe, left bronchus or lung: Secondary | ICD-10-CM | POA: Diagnosis not present

## 2016-10-17 DIAGNOSIS — R569 Unspecified convulsions: Secondary | ICD-10-CM | POA: Diagnosis not present

## 2016-10-17 DIAGNOSIS — E785 Hyperlipidemia, unspecified: Secondary | ICD-10-CM | POA: Diagnosis present

## 2016-10-17 DIAGNOSIS — G9389 Other specified disorders of brain: Secondary | ICD-10-CM | POA: Diagnosis not present

## 2016-10-17 DIAGNOSIS — J9383 Other pneumothorax: Secondary | ICD-10-CM | POA: Diagnosis not present

## 2016-10-17 DIAGNOSIS — N159 Renal tubulo-interstitial disease, unspecified: Secondary | ICD-10-CM | POA: Diagnosis present

## 2016-10-17 DIAGNOSIS — G936 Cerebral edema: Secondary | ICD-10-CM | POA: Diagnosis not present

## 2016-10-17 DIAGNOSIS — J9811 Atelectasis: Secondary | ICD-10-CM | POA: Diagnosis not present

## 2016-10-17 DIAGNOSIS — R1310 Dysphagia, unspecified: Secondary | ICD-10-CM | POA: Diagnosis present

## 2016-10-17 DIAGNOSIS — J449 Chronic obstructive pulmonary disease, unspecified: Secondary | ICD-10-CM | POA: Diagnosis present

## 2016-10-17 DIAGNOSIS — I10 Essential (primary) hypertension: Secondary | ICD-10-CM | POA: Diagnosis present

## 2016-10-17 DIAGNOSIS — Z4682 Encounter for fitting and adjustment of non-vascular catheter: Secondary | ICD-10-CM | POA: Diagnosis not present

## 2016-10-17 DIAGNOSIS — J90 Pleural effusion, not elsewhere classified: Secondary | ICD-10-CM | POA: Diagnosis not present

## 2016-10-17 DIAGNOSIS — F17210 Nicotine dependence, cigarettes, uncomplicated: Secondary | ICD-10-CM | POA: Diagnosis present

## 2016-10-17 DIAGNOSIS — Z9889 Other specified postprocedural states: Secondary | ICD-10-CM | POA: Diagnosis not present

## 2016-10-17 DIAGNOSIS — C3490 Malignant neoplasm of unspecified part of unspecified bronchus or lung: Secondary | ICD-10-CM | POA: Diagnosis not present

## 2016-10-17 DIAGNOSIS — R918 Other nonspecific abnormal finding of lung field: Secondary | ICD-10-CM | POA: Diagnosis not present

## 2016-10-17 DIAGNOSIS — Z9911 Dependence on respirator [ventilator] status: Secondary | ICD-10-CM | POA: Diagnosis not present

## 2016-10-17 DIAGNOSIS — F419 Anxiety disorder, unspecified: Secondary | ICD-10-CM | POA: Diagnosis not present

## 2016-10-17 DIAGNOSIS — C771 Secondary and unspecified malignant neoplasm of intrathoracic lymph nodes: Secondary | ICD-10-CM | POA: Diagnosis present

## 2016-10-17 DIAGNOSIS — C3432 Malignant neoplasm of lower lobe, left bronchus or lung: Secondary | ICD-10-CM | POA: Diagnosis not present

## 2016-10-17 DIAGNOSIS — I671 Cerebral aneurysm, nonruptured: Secondary | ICD-10-CM | POA: Diagnosis not present

## 2016-10-17 DIAGNOSIS — G8929 Other chronic pain: Secondary | ICD-10-CM | POA: Diagnosis present

## 2016-10-17 DIAGNOSIS — J939 Pneumothorax, unspecified: Secondary | ICD-10-CM | POA: Diagnosis not present

## 2016-10-17 DIAGNOSIS — Z452 Encounter for adjustment and management of vascular access device: Secondary | ICD-10-CM | POA: Diagnosis not present

## 2016-10-17 DIAGNOSIS — Z91040 Latex allergy status: Secondary | ICD-10-CM | POA: Diagnosis not present

## 2016-10-17 DIAGNOSIS — C3492 Malignant neoplasm of unspecified part of left bronchus or lung: Secondary | ICD-10-CM | POA: Diagnosis not present

## 2016-10-17 DIAGNOSIS — Z7982 Long term (current) use of aspirin: Secondary | ICD-10-CM | POA: Diagnosis not present

## 2016-10-17 DIAGNOSIS — S0003XA Contusion of scalp, initial encounter: Secondary | ICD-10-CM | POA: Diagnosis not present

## 2016-10-17 DIAGNOSIS — R4182 Altered mental status, unspecified: Secondary | ICD-10-CM | POA: Diagnosis not present

## 2016-10-17 DIAGNOSIS — T797XXA Traumatic subcutaneous emphysema, initial encounter: Secondary | ICD-10-CM | POA: Diagnosis not present

## 2016-10-17 DIAGNOSIS — N3090 Cystitis, unspecified without hematuria: Secondary | ICD-10-CM | POA: Diagnosis present

## 2016-10-23 DIAGNOSIS — I671 Cerebral aneurysm, nonruptured: Secondary | ICD-10-CM | POA: Insufficient documentation

## 2016-11-06 DIAGNOSIS — R5082 Postprocedural fever: Secondary | ICD-10-CM | POA: Diagnosis not present

## 2016-11-06 DIAGNOSIS — J9811 Atelectasis: Secondary | ICD-10-CM | POA: Diagnosis not present

## 2016-11-06 DIAGNOSIS — Z9889 Other specified postprocedural states: Secondary | ICD-10-CM | POA: Diagnosis not present

## 2016-11-06 DIAGNOSIS — I69331 Monoplegia of upper limb following cerebral infarction affecting right dominant side: Secondary | ICD-10-CM | POA: Diagnosis not present

## 2016-11-06 DIAGNOSIS — J939 Pneumothorax, unspecified: Secondary | ICD-10-CM | POA: Diagnosis not present

## 2016-11-06 DIAGNOSIS — G936 Cerebral edema: Secondary | ICD-10-CM | POA: Diagnosis present

## 2016-11-06 DIAGNOSIS — M545 Low back pain: Secondary | ICD-10-CM | POA: Diagnosis not present

## 2016-11-06 DIAGNOSIS — I10 Essential (primary) hypertension: Secondary | ICD-10-CM | POA: Diagnosis not present

## 2016-11-06 DIAGNOSIS — I671 Cerebral aneurysm, nonruptured: Secondary | ICD-10-CM | POA: Diagnosis not present

## 2016-11-06 DIAGNOSIS — Z8719 Personal history of other diseases of the digestive system: Secondary | ICD-10-CM | POA: Diagnosis not present

## 2016-11-06 DIAGNOSIS — M79669 Pain in unspecified lower leg: Secondary | ICD-10-CM | POA: Diagnosis not present

## 2016-11-06 DIAGNOSIS — R509 Fever, unspecified: Secondary | ICD-10-CM | POA: Diagnosis not present

## 2016-11-06 DIAGNOSIS — R100 Acute abdomen: Secondary | ICD-10-CM | POA: Diagnosis not present

## 2016-11-06 DIAGNOSIS — F419 Anxiety disorder, unspecified: Secondary | ICD-10-CM | POA: Diagnosis not present

## 2016-11-06 DIAGNOSIS — M79604 Pain in right leg: Secondary | ICD-10-CM | POA: Diagnosis not present

## 2016-11-06 DIAGNOSIS — M79605 Pain in left leg: Secondary | ICD-10-CM | POA: Diagnosis not present

## 2016-11-06 DIAGNOSIS — J449 Chronic obstructive pulmonary disease, unspecified: Secondary | ICD-10-CM | POA: Diagnosis not present

## 2016-11-06 DIAGNOSIS — C3432 Malignant neoplasm of lower lobe, left bronchus or lung: Secondary | ICD-10-CM | POA: Diagnosis not present

## 2016-11-06 DIAGNOSIS — J90 Pleural effusion, not elsewhere classified: Secondary | ICD-10-CM | POA: Diagnosis not present

## 2016-11-06 DIAGNOSIS — Z85118 Personal history of other malignant neoplasm of bronchus and lung: Secondary | ICD-10-CM | POA: Diagnosis not present

## 2016-11-20 ENCOUNTER — Ambulatory Visit: Payer: Medicare Other | Attending: Physical Medicine and Rehabilitation | Admitting: Occupational Therapy

## 2016-11-20 ENCOUNTER — Encounter: Payer: Self-pay | Admitting: Occupational Therapy

## 2016-11-20 ENCOUNTER — Ambulatory Visit: Payer: Medicare Other | Admitting: Speech Pathology

## 2016-11-20 DIAGNOSIS — R2689 Other abnormalities of gait and mobility: Secondary | ICD-10-CM | POA: Insufficient documentation

## 2016-11-20 DIAGNOSIS — R41841 Cognitive communication deficit: Secondary | ICD-10-CM | POA: Diagnosis not present

## 2016-11-20 DIAGNOSIS — R278 Other lack of coordination: Secondary | ICD-10-CM

## 2016-11-20 DIAGNOSIS — M6281 Muscle weakness (generalized): Secondary | ICD-10-CM | POA: Diagnosis not present

## 2016-11-20 NOTE — Therapy (Addendum)
[place] MAIN [facility] SERVICES [address] [place], [place], [address] Phone: [phone]   Fax:  [phone]  Occupational Therapy Evaluation  Patient Details  Name: Monica Collins MRN: [id number] Date of Birth: [birth year] Referring Provider: Dr. Dina Rich  Encounter Date: 11/20/2016      OT End of Session - 11/20/2016   Visit Number 1   Number of Visits 24   Date for OT Re-Evaluation 02/12/17   Authorization Type Medicare G code 1 of 10   OT Start Time 1300   OT Stop Time 1400   OT Time Calculation (min) 60 min   Activity Tolerance Patient tolerated treatment well   Behavior During Therapy [facility] for tasks assessed/performed      Past Medical History:  Diagnosis Date  . Hypertension     Past Surgical History:  Procedure Laterality Date  . CHOLECYSTECTOMY      There were no vitals filed for this visit.      Subjective Assessment - 11/20/2016   Subjective  Pt. is accompanied by her 2 daughters. Pt. family was provided with information about [facility] services.   Patient is accompained by: Family member   Currently in Pain? No/denies           [facility] OT Assessment - 11/20/16 0001      Assessment   Diagnosis R MCA Aneurysm   Referring Provider Dr. Dina Rich   Onset Date 10/17/16     Balance Screen   Has the patient fallen in the past 6 months No   Has the patient had a decrease in activity level because of a fear of falling?  No   Is the patient reluctant to leave their home because of a fear of falling?  No     Home  Environment   Family/patient expects to be discharged to: Private residence   Type of [place] level   Bathroom Shower/Tub Tub/Shower unit   [place] bars - tub/shower;Shower seat;Hand held Teaching laboratory technician   Lives With Spouse;Daughter     Prior Function   Level of Independence Independent     ADL   Eating/Feeding Supervision/safety   Grooming  Moderate assistance   Upper Body Bathing Moderate assistance   Lower Body Bathing Moderate assistance   Upper Body Dressing Set up   Lower Body Dressing Minimal assistance   Toilet Transfer Supervision/safety   Toileting -  Hygiene Moderate assistance  Pt. has to be prompted, and requires cues for toileting.   Tub/Shower Transfer Minimal assistance   ADL comments Pt. presents with poor task initiation, and follow through.      IADL   Shopping Completely unable to shop   Light Housekeeping Does not participate in any housekeeping tasks   Meal Prep Needs to have meals prepared and served   [employer] on family or friends for transportation   Medication Management Is not capable of dispensing or managing own medication   Financial Management Is not capable of dispensing or managing own medication;Dependent     Written Expression   Dominant Hand Right   Handwriting --  Impaired     Vision - History   Baseline Vision Wears glasses only for reading     Vision Assessment   Vision Assessment Vision impaired  _ to be further tested in functional context   Ocular Range of Motion Within Functional Limits   Tracking/Visual Pursuits Able to  track stimulus in all quads without difficulty   Visual Fields Left visual field deficit   Comment Left neglect     Cognition   Overall Cognitive Status Impaired/Different from baseline   Memory Impaired   Memory Impairment Decreased long term memory;Decreased short term memory   Problem Solving Impaired   Executive Function Initiating;Reasoning;Sequencing;Organizing     Sensation   Light Touch Appears Intact   Proprioception Appears Intact     Coordination   Gross Motor Movements are Fluid and Coordinated Yes   Right 9 Hole Peg Test 25   Left 9 Hole Peg Test 47     Strength   Overall Strength Comments RUE: WFL, LUE: shoulder flexion abduction 4/5, elbow flexion extension 4+/5, wrist extension 4/5.      Hand Function   Right  Hand Grip (lbs) 32   Right Hand Lateral Pinch 10 lbs   Right Hand 3 Point Pinch 12 lbs   Left Hand Grip (lbs) 18   Left Hand Lateral Pinch 10 lbs   Left 3 point pinch 8 lbs                         OT Education - 11/20/16    Education provided Yes   Education Details OT services, visual scanning, visual compensatory strategies   Person(s) Educated Patient   Methods Explanation   Comprehension Verbalized understanding            OT Long Term Goals - 11/20/16 1742      OT LONG TERM GOAL #1   Title Pt. will improve left hand [facility] skills by 5 sec. to assist with grooming.   Baseline R: 25, L: 47   Time 2   Period Weeks   Status New   Target Date 02/12/17     OT LONG TERM GOAL #2   Title Pt. will improve left grip strength by 5# to be able to open jars, bolttles   Baseline R: 32, L: 18   Time 12   Period Weeks   Status New   Target Date 02/12/17     OT LONG TERM GOAL #3   Title Pt. will improve left 3pt. pinch strength by 3# to be able to hold, and use a toothbrush.   Baseline R: 12#, L: 8#   Time 12   Period Weeks   Status New   Target Date 02/12/17     OT LONG TERM GOAL #4   Title Pt. will be independent with  UE HEP program   Baseline Assist required   Time 12   Period Weeks   Status New   Target Date 02/12/17     OT LONG TERM GOAL #5   Title Pt. will independently follow a simple cold recipe.   Baseline Pt. is unable   Time 12   Period Weeks   Status New   Target Date 02/12/17     OT LONG TERM GOAL #6   Title Pt. will scan to the left 100% of the time with minimal cues to be able to navigate her environment during IADL tasks using visual compensatory strategies.   Baseline Pt. has difficulty   Time 12   Period Weeks   Status New   Target Date 02/12/17     OT LONG TERM GOAL #7   Title Pt. will scan to the left 100% of the time with minimal cues during tabletop ADL tasks, using visual compensatory strategies.  Baseline Pt. has  difficulty   Time 12   Period Weeks   Status New   Target Date 02/12/17               Plan - 11/20/2016   Clinical Impression Statement Pt. is a 71 y.o. female who comes to [facility] for outpatient OT services following a Left Lower Lobe Lobectomy for Stage II Lung CA on 10/17/2016. and Right MCA Aneurysm Clipping on 10/23/2016. Pt. presents with Left sided weakness, and incoordination, left visual field deficits, left neglect, and cognitve impairements which hinder her ability to complete ADL, and IADL functioning. Pt. presents with poor initiation, and difficulty completing all ADL, and IADL tasks. Pt. benefits from skilled OT services to improve ADL, and IADL functioning.  Pt.'s family was provided with information about [facility] home care services to assist caregivers.   Occupational performance deficits (Please refer to evaluation for details): ADL's;IADL's;Leisure   Rehab Potential Good   Current Impairments/barriers affecting progress: Positive Indicators: familiy support, Age. Negative Indicators: multiple comorbidities.   OT Frequency 2x / week   OT Duration 12 weeks   OT Treatment/Interventions Self-care/ADL training;Therapeutic exercise;Energy conservation;DME and/or AE instruction;Therapeutic activities;Therapeutic exercises;Manual Therapy;Neuromuscular education;Patient/family education;Visual/perceptual remediation/compensation;Cognitive remediation/compensation   Consulted and Agree with Plan of Care Family member/caregiver   Family Member Consulted 2 daughters      Patient will benefit from skilled therapeutic intervention in order to improve the following deficits and impairments:  Pain, Impaired UE functional use, Decreased strength, Decreased coordination, Decreased cognition, Decreased range of motion, Decreased activity tolerance, Impaired vision/preception, Decreased safety awareness, Decreased balance, Decreased knowledge of precautions  Visit Diagnosis: Muscle  weakness (generalized) - Plan: Ot plan of care cert/re-cert  Other lack of coordination - Plan: Ot plan of care cert/re-cert      G-Codes - 30/09/23    Functional Assessment Tool Used (Outpatient only) clinical judgement, MMT, ROM, 9-hole peg test   Functional Limitation Self care   Self Care Current Status (R0076) At least 60 percent but less than 80 percent impaired, limited or restricted   Self Care Goal Status (A2633) At least 20 percent but less than 40 percent impaired, limited or restricted      Problem List Patient Active Problem List   Diagnosis Date Noted  . 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 [place], [place], [address] Phone: [phone]   Fax:  [phone]  Name: JEANNIA TATRO MRN: [id number] Date of Birth: 09-11-1945

## 2016-11-21 ENCOUNTER — Encounter: Payer: Self-pay | Admitting: Speech Pathology

## 2016-11-21 DIAGNOSIS — J90 Pleural effusion, not elsewhere classified: Secondary | ICD-10-CM | POA: Diagnosis not present

## 2016-11-21 DIAGNOSIS — C3432 Malignant neoplasm of lower lobe, left bronchus or lung: Secondary | ICD-10-CM | POA: Diagnosis not present

## 2016-11-21 DIAGNOSIS — Z9889 Other specified postprocedural states: Secondary | ICD-10-CM | POA: Diagnosis not present

## 2016-11-21 NOTE — Therapy (Signed)
[place] MAIN [facility] SERVICES [address] [place], [place], [address] Phone: [phone]   Fax:  [phone]  Speech Language Pathology Evaluation  Patient Details  Name: Monica Collins MRN: [id number] Date of Birth: 07-04-1945 Referring Provider: Dr. Dina Rich  Encounter Date: 11/20/2016      End of Session - 11/21/16 0832    Visit Number 1   Number of Visits 17   Date for SLP Re-Evaluation 01/21/17   SLP Start Time 69   SLP Stop Time  1500   SLP Time Calculation (min) 60 min   Activity Tolerance Patient tolerated treatment well      Past Medical History:  Diagnosis Date  . Hypertension     Past Surgical History:  Procedure Laterality Date  . CHOLECYSTECTOMY      There were no vitals filed for this visit.          SLP Evaluation [facility] - 11/21/16 0001      SLP Visit Information   SLP Received On 11/20/16   Referring Provider Dr. Dina Rich   Onset Date 10/23/16   Medical Diagnosis Right MCA Aneurysm     Subjective   Subjective Patient said she feels drained and that she does not communicate as much as she used to.   Patient/Family Stated Goal Get back to baseline before the aneurysm.     Pain Assessment   Currently in Pain? No/denies     General Information   HPI Patient is a 71 year old woman who suffered a right MCA aneurysm; right Craniectomy and Clip Ligation of aneurysm performed on 10/23/16. Patient's neurological state deteriorated on 10/29/16, and head CT showed edema around aneurysm clip site. She spontaneously improved to her baseline that same day. On 10/31/16, patient's family noticed patient was having seizure-like activity with pill-rolling hand movements. Patient was monitored on an EEG and no electrographic seizures were noted over 48 hours of continuous monitoring. She was evaluated by SLP for swallowing and is on a regular diet with thin liquids as of 11/04/16. SLP noted that patient has low vocal intensity.  Patient was [facility] for oral mech exam, orientation, and memory evaluations but demonstrated deficits in problem solving, safety/judgement, visual perception (left neglect), comprehension, and pragmatics/prosody. SLP recommended intensive speech therapy. 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 Patient's prior level of functioning was [facility] before the aneurysm.       Prior Functional Status   Cognitive/Linguistic Baseline Within functional limits     Cognition   Overall Cognitive Status Impaired/Different from baseline     Oral Motor/Sensory Function   Overall Oral Motor/Sensory Function Appears within functional limits for tasks assessed     Motor Speech   Overall Motor Speech Impaired   Phonation Low vocal intensity;Wet     Standardized Assessments   Standardized Assessments  Cognitive Linguistic Quick Test      Cognitive Linguistic Quick Test (CLQT) Cognitive Domain      Score/Severity Rating Attention       54/Moderate Memory       136/Moderate Executive Functions      12/Severe Language       28/Mild Visuospatial Skills      33/Severe Clock Drawing       7/Severe Composite Severity Rating: 1.8/Moderate  Western Aphasia Battery Screening (WAB) Spontaneous Speech: Content     9/10 Spontaneous Speech: Fluency     10/10 Auditory Verbal Comprehension: Y/N Questions  10/10 Sequential Commands      10/10 Repetition       10/10 Object Naming       10/10 Bedside Aphasia Score: 98 Reading        9/10 Writing        10/10 Bedside Language Score: 97.5 Within Functional Limits  Observations: Patient perseverated on the Design Generation and Symbol Cancellation sections of the CLQT. Given prompting from the clinician, patient was able to initially make corrections to the Symbol Cancellation task by circling incorrect symbols she had crossed out. However, she also then circled some correct symbols, indicating that sustained  attention has been impacted. For the design memory task, patient chose more than two designs for the first question and did not self-correct. Patient chose more than two designs for the third question as well but when prompted by the clinician, she self-corrected and confidently chose the two correct designs. Patient's voice is quiet and gurgly sounding.                    SLP Education - 11/21/16 0831    Education provided Yes   Education Details Role of SLP in cognitive communication therapy.   Person(s) Educated Patient;Child(ren)   Methods Explanation   Comprehension Verbalized understanding            SLP Long Term Goals - 11/21/16 7124      SLP LONG TERM GOAL #1   Title Patient will identify cognitive barriers and participate in developing functional compensatory strategies.   Time 8   Period Weeks   Status New     SLP LONG TERM GOAL #2   Title Patient will demonstrate functional cognitive-communication skills for independent completion of personal responsibilities.    Time 8   Period Weeks   Status New     SLP LONG TERM GOAL #3   Title Patient will complete complex visual-spatial activities with 80% accuracy.   Time 8   Period Weeks   Status New     SLP LONG TERM GOAL #4   Title Patient will complete attention, executive function skills, and memory strategy activities with 80% accuracy.   Time 8   Period Weeks   Status New          Plan - 11/21/16 5809    Clinical Impression Statement At one month post onset of right MCA aneurysm with craniotomy and clip ligation, this 71 year old woman, is presenting with moderate cognitive communication impairment characterized by impairment of attention, memory, executive function, visuospatial skills, and clock drawing.  The results of the Cognitive Linguistic Quick Test (CLQT) indicate a composite severity rating of Moderate.  The patient scored with severe deficit of executive function, visuospatial skills,  and clock drawing; moderate deficit of attention and memory; and mild impairment of language.  Language screening (Western Aphasia Battery- Screening) shows functional oral and written language output and comprehension.  Patient presents with a flat affect and answers questions with reduced information content.  The patient has made measurable gains in all areas of impairment and is stimulable to improve performance on all tasks with support.  The patient will benefit from restorative and compensatory treatment of cognitive communication deficits.    Speech Therapy Frequency 2x / week   Duration Other (comment)   Treatment/Interventions Compensatory techniques;Patient/family education;Compensatory strategies;SLP instruction and feedback;Functional tasks   Potential to Achieve Goals Good   Potential Considerations Ability to learn/carryover information;Cooperation/participation level;Family/community support   SLP Home Exercise Plan To  be determined   Consulted and Agree with Plan of Care Patient;Family member/caregiver   Family Member Consulted Daughter      Patient will benefit from skilled therapeutic intervention in order to improve the following deficits and impairments:   Cognitive communication deficit    Problem List Patient Active Problem List   Diagnosis Date Noted  . 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

## 2016-11-25 ENCOUNTER — Ambulatory Visit: Payer: Medicare Other

## 2016-11-25 ENCOUNTER — Encounter: Payer: Self-pay | Admitting: Speech Pathology

## 2016-11-25 ENCOUNTER — Ambulatory Visit: Payer: Medicare Other | Admitting: Occupational Therapy

## 2016-11-25 ENCOUNTER — Ambulatory Visit: Payer: Medicare Other | Admitting: Speech Pathology

## 2016-11-25 DIAGNOSIS — R41841 Cognitive communication deficit: Secondary | ICD-10-CM

## 2016-11-25 DIAGNOSIS — M6281 Muscle weakness (generalized): Secondary | ICD-10-CM

## 2016-11-25 DIAGNOSIS — R2689 Other abnormalities of gait and mobility: Secondary | ICD-10-CM | POA: Diagnosis not present

## 2016-11-25 DIAGNOSIS — R278 Other lack of coordination: Secondary | ICD-10-CM

## 2016-11-25 NOTE — Therapy (Signed)
[place] MAIN [facility] SERVICES [address] [place], [place], [address] Phone: [phone]   Fax:  [phone]  Physical Therapy Evaluation  Patient Details  Name: Monica Collins MRN: [id number] Date of Birth: 1945/07/13 Referring Provider: Leretha Dykes, DO  Encounter Date: 11/25/2016      PT End of Session - 11/25/16 1702    Visit Number 1   Number of Visits 10   Date for PT Re-Evaluation 12/30/16   Authorization Type recheck goals 12/26/16.   Authorization - Visit Number 1   Authorization - Number of Visits 10   PT Start Time 9470   PT Stop Time 1616   PT Time Calculation (min) 61 min   Equipment Utilized During Treatment Gait belt   Activity Tolerance Patient tolerated treatment well;Treatment limited secondary to medical complications (Comment)   Behavior During Therapy WFL for tasks assessed/performed      Past Medical History:  Diagnosis Date  . Hypertension     Past Surgical History:  Procedure Laterality Date  . CHOLECYSTECTOMY      There were no vitals filed for this visit.        Subjective Assessment - 11/25/16 1527    Subjective Patient is a pleasant 71 year old female who is s/p cerebral aneurysm   Patient is accompained by: Family member   Pertinent History Patient is a 71 year old female who was admittedto [facility] after a Right craniectomy and clip ligation for a R MCA Aneurysm on 10-23-16. Left lower lobe resection on 10-17-16. She was in the hospital for 30 days. 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    Limitations Lifting;Walking;House hold activities   How long can you sit comfortably? N/A   How long can you stand comfortably? 15 minutes   How long can you walk comfortably? 20 minutes   Patient Stated Goals do stuff around the house   Currently in Pain? Yes   Pain Score 4    Pain Location Back   Pain Orientation Upper   Pain Descriptors / Indicators Aching   Pain Type Acute pain   Pain  Radiating Towards moves around   Pain Onset Today   Pain Frequency Constant   Aggravating Factors  moving more   Pain Relieving Factors tyelonel      PAIN: Upper back painful POSTURE: Seated with hands under legs, forward head rounded shoulders   STRENGTH:  Graded on a 0-5 scale Muscle Group Left Right  Shoulder flex    Shoulder Abd    Shoulder Ext    Shoulder IR/ER    Elbow    Wrist/hand    Hip Flex    Hip Abd    Hip Add 4   Hip Ext    Hip IR/ER    Knee Flex    Knee Ext    Ankle DF    Ankle PF    L4-/5 R: 4/5 SENSATION: normal  SPECIAL TESTS: Visual fields are equal Able to folllow finger Shin coordination less on L, more choppy Nose to finger L missing end range  FUNCTIONAL MOBILITY: independent  BALANCE: BERG: 49/56  GAIT: Shortened steps with foot flat contact, decreased hip extension and arm swing,  OUTCOME MEASURES: TEST Outcome Interpretation  5 times sit<>stand 18 sec >60 yo, >15 sec indicates increased risk for falls  10 meter walk test      .71           m/s <1.0 m/s indicates  increased risk for falls; limited community ambulator  Chartered loss adjuster 49/56 <36/56 (100% risk for falls), 37-45 (80% risk for falls); 46-51 (>50% risk for falls); 52-55 (lower risk <25% of falls)  10MWT:14 ABC-S=40% LEFS=24/80  Tx: Seated abduction RTB10x Seated adduction RTB10x Seated marching 20x Seated heel raises 10x Seated toe raises 10x  This patient presents with 3, personal factors/ comorbidities and 3 ,   body elements including body structures and functions, activity limitations and or participation restrictions. Patient's condition is evolving.   Patient requires extra time for processing of commands due to mental component.      [facility] PT Assessment - 11/25/16 0001      Assessment   Medical Diagnosis cerebral aneurysm   Referring Provider Leretha Dykes, DO   Onset Date/Surgical Date 10/17/16   Hand Dominance Right   Next MD Visit  unsure   Prior Therapy cannot remember     Precautions   Precautions Fall  potenetial fall, could not remember     Restrictions   Weight Bearing Restrictions No     Balance Screen   Has the patient fallen in the past 6 months No   Has the patient had a decrease in activity level because of a fear of falling?  Yes   Is the patient reluctant to leave their home because of a fear of falling?  Yes     [place] residence   Living Arrangements Spouse/significant other   Available Help at Discharge Family   Type of [place] to enter   Entrance Stairs-Number of Steps [place] One level   [place] bars - toilet;Grab bars - tub/shower;Shower seat     Prior Function   Level of Independence Independent   Vocation Retired     Charity fundraiser Status Impaired/Different from baseline   Area of Impairment Memory;Safety/judgement;Awareness;Problem solving   Memory Decreased recall of precautions   Safety/Judgement Decreased awareness of deficits   Problem Solving Slow processing     Sensation   Light Touch Appears Intact     Coordination   Gross Motor Movements are Fluid and Coordinated No   Finger Nose Finger Test L not fluid at end range, frequently missing end target   Heel Shin Test fluid, L<R     Transfers   Transfers Independent with all Transfers     Ambulation/Gait   Assistive device None   Gait Pattern Step-through pattern;Decreased arm swing - right;Decreased arm swing - left;Decreased step length - right;Decreased step length - left;Decreased hip/knee flexion - right;Decreased hip/knee flexion - left;Decreased trunk rotation;Narrow base of support;Poor foot clearance - left;Poor foot clearance - right   Ambulation Surface Level   Gait velocity .71     Balance   Balance Assessed Yes     Static Sitting Balance   Static Sitting - Balance  Support Feet supported   Static Sitting - Level of Assistance 7: Independent     Static Standing Balance   Static Standing - Balance Support During functional activity   Static Standing - Level of Assistance 7: Independent   Static Standing Balance -  Activities  Single Leg Stance - Right Leg;Single Leg Stance - Left Leg;Tandam Stance - Right Leg;Tandam Stance - Left Leg     Dynamic Standing Balance   Dynamic Standing - Balance Support No upper extremity supported   Dynamic Standing -  Level of Assistance 6: Modified independent (Device/Increase time)   Dynamic Standing - Balance Activities Alternatig foot taps;Forward lean/weight shifting;Reaching for objects     Standardized Balance Assessment   Standardized Balance Assessment Berg Balance Test     Berg Balance Test   Sit to Stand Able to stand without using hands and stabilize independently   Standing Unsupported Able to stand safely 2 minutes   Sitting with Back Unsupported but Feet Supported on Floor or Stool Able to sit safely and securely 2 minutes   Stand to Sit Sits safely with minimal use of hands   Transfers Able to transfer safely, minor use of hands   Standing Unsupported with Eyes Closed Able to stand 10 seconds safely   Standing Ubsupported with Feet Together Able to place feet together independently and stand 1 minute safely   From Standing, Reach Forward with Outstretched Arm Can reach forward >12 cm safely (5")   From Standing Position, Pick up Object from Floor Able to pick up shoe safely and easily   From Standing Position, Turn to Look Behind Over each Shoulder Looks behind from both sides and weight shifts well   Turn 360 Degrees Able to turn 360 degrees safely but slowly   Standing Unsupported, Alternately Place Feet on Step/Stool Able to complete 4 steps without aid or supervision   Standing Unsupported, One Foot in Front Able to plae foot ahead of the other independently and hold 30 seconds   Standing on One Leg  Able to lift leg independently and hold 5-10 seconds   Total Score 49            Objective measurements completed on examination: See above findings.                  PT Education - 11/25/16 1702    Education provided Yes   Education Details HEP   Person(s) Educated Patient;Other (comment)   Methods Explanation;Handout;Demonstration;Verbal cues   Comprehension Verbalized understanding;Returned demonstration             PT Long Term Goals - 11/25/16 1736      PT LONG TERM GOAL #1   Title Patient will increase Berg Balance score by > 6 points (55/56) to demonstrate decreased fall risk during functional activities.   Baseline 7/30: 49/56   Time 6   Period Weeks   Status New   Target Date 12/23/16     PT LONG TERM GOAL #2   Title Patient will increase 10 meter walk test to >1.19m/s as to improve gait speed for better community ambulation and to reduce fall risk.   Baseline 7/30: .71   Time 6   Period Weeks   Status New   Target Date 12/23/16     PT LONG TERM GOAL #3   Title Patient will increase ABC scale score >80% to demonstrate better functional mobility and better confidence with ADLs.    Baseline 7/30: 40%   Time 6   Period Weeks   Status New   Target Date 12/23/16     PT LONG TERM GOAL #4   Title Patient will increase lower extremity functional scale to >50/80 to demonstrate improved functional mobility and increased tolerance with ADLs.    Baseline 7/30: 24/80   Time 6   Period Weeks   Status New   Target Date 12/23/16     PT LONG TERM GOAL #5   Title Patient (> 72 years old) will complete five times sit to stand  test in < 15 seconds indicating an increased LE strength and improved balance.   Baseline 12/22/22 18 sec   Time 6   Period Weeks   Status New   Target Date 12/23/16                Plan - 12/21/16 1710    Clinical Impression Statement Patient is a pleasant 71 year old female who presents to therapy s/p cerebral  aneurysm. She was hospitalized for 30 days and had left neglect. ABC-s=40%, LEFS=24/80, 5xSTS= 18 seconds, 10MWT=.71 mps, BERG=49.56. Left sided coordination is altered with dysmetria like noted symptoms. Gait is altered with shortened steps, decreased trunk and arm rotation/swing, and decreased clearance of feet. Patient demonstrates weak LE's bilaterally with L weaker than R. Patient will benefit from skilled physical therapy to improve functional ambulation, balance, and LE strength for return to previous level of function.    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    Clinical Presentation Evolving   Clinical Presentation due to: improving left neglect, improving visual fields and cognition   Clinical Decision Making Moderate   Rehab Potential Fair   Clinical Impairments Affecting Rehab Potential This patient presents with 3, personal factors/ comorbidities and 3 ,   body elements including body structures and functions, activity limitations and or participation restrictions. Patient's condition is evolving.   PT Frequency 2x / week   PT Duration 6 weeks   PT Treatment/Interventions ADLs/Self Care Home Management;Biofeedback;Cryotherapy;Scientist, product/process development;Iontophoresis 4mg /ml Dexamethasone;Moist Heat;Ultrasound;Traction;Gait training;Stair training;Functional mobility training;Therapeutic activities;Therapeutic exercise;Balance training;Patient/family education;Cognitive remediation;Neuromuscular re-education;Manual techniques;Compression bandaging;Passive range of motion;Energy conservation;Taping;Visual/perceptual  remediation/compensation   PT Next Visit Plan review HEP, balance, ambulation, strength   PT Home Exercise Plan see sheet   Consulted and Agree with Plan of Care Patient;Family member/caregiver      Patient will benefit from skilled therapeutic intervention in order to improve the following deficits and impairments:  Abnormal gait, Decreased activity tolerance, Decreased balance, Decreased knowledge of precautions, Decreased endurance, Decreased coordination, Decreased cognition, Decreased knowledge of use of DME, Decreased mobility, Decreased range of motion, Decreased safety awareness, Difficulty walking, Decreased strength, Impaired flexibility, Impaired perceived functional ability, Impaired vision/preception, Postural dysfunction, Improper body mechanics, Pain  Visit Diagnosis: Muscle weakness (generalized)  Other abnormalities of gait and mobility      G-Codes - 2016-12-21 1747    Functional Assessment Tool Used (Outpatient Only) 10MWT, 5xSTS, BERG, ABC-S, LEFS, clinical judgement, MMT   Functional Limitation Mobility: Walking and moving around   Mobility: Walking and Moving Around Current Status [phone]) At least 40 percent but less than 60 percent impaired, limited or restricted   Mobility: Walking and Moving Around Goal Status [phone]) At least 20 percent but less than 40 percent impaired, limited or restricted       Problem List Patient Active Problem List   Diagnosis Date Noted  . 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 SERVICES [address] [place], [place], [address] Phone: [phone]   Fax:  [phone]  Name: Monica Collins MRN: [id number]  Date of Birth: April 17, 1946

## 2016-11-25 NOTE — Therapy (Signed)
[place] MAIN [facility] SERVICES [address] [place], [place], [address] Phone: [phone]   Fax:  [phone]  Speech Language Pathology Treatment  Patient Details  Name: Monica Collins MRN: [id number] Date of Birth: 07/17/1945 Referring Provider: Dr. Dina Rich  Encounter Date: 11/25/2016      End of Session - 11/25/16 1525    Visit Number 2   Number of Visits 17   Date for SLP Re-Evaluation 01/21/17   SLP Start Time 1400   SLP Stop Time  1500   SLP Time Calculation (min) 60 min   Activity Tolerance Patient tolerated treatment well      Past Medical History:  Diagnosis Date  . Hypertension     Past Surgical History:  Procedure Laterality Date  . CHOLECYSTECTOMY      There were no vitals filed for this visit.      Subjective Assessment - 11/25/16 1524    Subjective Patient admits to getting fatigued with cognitive tasks   Patient is accompained by: Family member   Currently in Pain? No/denies               ADULT SLP TREATMENT - 11/25/16 0001      General Information   Behavior/Cognition Alert;Cooperative;Pleasant mood   HPI Patient is a 71 year old woman who suffered a right MCA aneurysm; right Craniectomy and Clip Ligation of aneurysm performed on 10/23/16. Patient's neurological state deteriorated on 10/29/16, and head CT showed edema around aneurysm clip site. She spontaneously improved to her baseline that same day. On 10/31/16, patient's family noticed patient was having seizure-like activity with pill-rolling hand movements. Patient was monitored on an EEG and no electrographic seizures were noted over 48 hours of continuous monitoring. She was evaluated by SLP for swallowing and is on a regular diet with thin liquids as of 11/04/16. SLP noted that patient has low vocal intensity. Patient was [facility] for oral mech exam, orientation, and memory evaluations but demonstrated deficits in problem solving, safety/judgement, visual  perception (left neglect), comprehension, and pragmatics/prosody. SLP recommended intensive speech therapy. 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 Patient's prior level of functioning was [facility] before the aneurysm.       Treatment Provided   Treatment provided Cognitive-Linquistic     Pain Assessment   Pain Assessment No/denies pain     Cognitive-Linquistic Treatment   Treatment focused on Cognition;Patient/family/caregiver education   Skilled Treatment VISUOSPATIAL SKILLS: Complete simple attention worksheets with 70% accuracy independently; errors due to omission of items on page, misinterpretation of instruction, and poorly organized approach.  Find 4 hidden pictures independently, after max cue to find first and min cue to find a last one.  LANGUAGE/REASONING SKILLS: Fill in the blank sentence completion with 85% accuracy, error due to reduced attention to detail.  Identify word that does not belong in list of 5, cross it out, and list a word that does belong with 60% accuracy.  Complete simple verbal analogies (given 3 possible responses) with 70% accuracy and verbally explain relationship with 90% accuracy.  Given proverb, select best explanation given 3 options with 90% accuracy.      Assessment / Recommendations / Plan   Plan Continue with current plan of care     Progression Toward Goals   Progression toward goals Progressing toward goals          SLP Education - 11/25/16 1524    Education provided Yes  Education Details Activites to stimulate cognitive skills   Person(s) Educated Patient;Other (comment)   Methods Explanation   Comprehension Verbalized understanding            SLP Long Term Goals - 11/21/16 7711      SLP LONG TERM GOAL #1   Title Patient will identify cognitive barriers and participate in developing functional compensatory strategies.   Time 8   Period Weeks   Status New     SLP LONG  TERM GOAL #2   Title Patient will demonstrate functional cognitive-communication skills for independent completion of personal responsibilities.    Time 8   Period Weeks   Status New     SLP LONG TERM GOAL #3   Title Patient will complete complex visual-spatial activities with 80% accuracy.   Time 8   Period Weeks   Status New     SLP LONG TERM GOAL #4   Title Patient will complete attention, executive function skills, and memory strategy activities with 80% accuracy.   Time 8   Period Weeks   Status New          Plan - 11/25/16 1525    Clinical Impression Statement The patient is demonstrating good sustained attention for task done today.  She demonstrates reduced processing speed.  She is able to read directions for tasks and follow through.  She responds to feedback and can improve performance when cued.   Speech Therapy Frequency 2x / week   Duration Other (comment)   Treatment/Interventions Compensatory techniques;Patient/family education;Compensatory strategies;SLP instruction and feedback;Functional tasks   Potential to Achieve Goals Good   Potential Considerations Ability to learn/carryover information;Cooperation/participation level;Family/community support   SLP Home Exercise Plan Hidden pictures   Consulted and Agree with Plan of Care Patient;Family member/caregiver   Family Member Consulted Sister      Patient will benefit from skilled therapeutic intervention in order to improve the following deficits and impairments:   Cognitive communication deficit    Problem List Patient Active Problem List   Diagnosis Date Noted  . 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 [place], [place], [address] Phone: [phone]   Fax:  [phone]   Name: Monica Collins MRN: [id number] Date  of Birth: 1946/03/17

## 2016-11-25 NOTE — Therapy (Signed)
[place] MAIN [facility] SERVICES [address] [place], [place], [address] Phone: [phone]   Fax:  [phone]  Occupational Therapy Treatment  Patient Details  Name: Monica Collins MRN: [id number] Date of Birth: 1945-11-16 Referring Provider: Dr. Dina Rich  Encounter Date: 11/25/2016      OT End of Session - 11/25/16 1422    Visit Number 2   Number of Visits 24   Date for OT Re-Evaluation 02/12/17   Authorization Type Medicare G code 2 of 10   OT Start Time 0630   OT Stop Time 1345   OT Time Calculation (min) 39 min   Activity Tolerance Patient tolerated treatment well   Behavior During Therapy [facility] for tasks assessed/performed      Past Medical History:  Diagnosis Date  . Hypertension     Past Surgical History:  Procedure Laterality Date  . CHOLECYSTECTOMY      There were no vitals filed for this visit.      Subjective Assessment - 11/25/16 1420    Subjective  Pt.'s sister accompanied pt. to session.   Patient is accompained by: Family member   Currently in Pain? No/denies       OT TREATMENT    Therapeutic Exercise:   Pt. worked on pinch strengthening in the left hand for lateral, and 3pt. pinch using red and green resistive clips. Pt. worked on placing the clips at various vertical and horizontal angles. Tactile and verbal cues were required for eliciting the desired movement. Pt. Worked on widening the scanning plane to the left when placing clips.  Selfcare:  Pt. Worked on visual scanning tasks, and visual search strategies in her near space for tabletop tasks. Pt. also worked on scanning in her extrapersonal space using a scanning board in preparation for scanning in her environment. Pt. Worked on saccades left to right with challenging left to right.  :                          OT Education - 11/25/16 1421    Education provided Yes   Education Details ROM   Person(s) Educated Patient   Methods Explanation   Comprehension Verbalized understanding            OT Long Term Goals - 11/20/16 1742      OT LONG TERM GOAL #1   Title Pt. will improve left hand [facility] skills by 5 sec. to assist with grooming.   Baseline R: 25, L: 47   Time 2   Period Weeks   Status New   Target Date 02/12/17     OT LONG TERM GOAL #2   Title Pt. will improve left grip strength by 5# to be able to open jars, bolttles   Baseline R: 32, L: 18   Time 12   Period Weeks   Status New   Target Date 02/12/17     OT LONG TERM GOAL #3   Title Pt. will improve left 3pt. pinch strength by 3# to be able to hold, and use a toothbrush.   Baseline R: 12#, L: 8#   Time 12   Period Weeks   Status New   Target Date 02/12/17     OT LONG TERM GOAL #4   Title Pt. will be independent with  UE HEP program   Baseline Assist required   Time 12   Period Weeks   Status New   Target Date 02/12/17  OT LONG TERM GOAL #5   Title Pt. will independently follow a simple cold recipe.   Baseline Pt. is unable   Time 12   Period Weeks   Status New   Target Date 02/12/17     OT LONG TERM GOAL #6   Title Pt. will scan to the left 100% of the time with minimal cues to be able to navigate her environment during IADL tasks using visual compensatory strategies.   Baseline Pt. has difficulty   Time 12   Period Weeks   Status New   Target Date 02/12/17     OT LONG TERM GOAL #7   Title Pt. will scan to the left 100% of the time with minimal cues during tabletop ADL tasks, using visual compensatory strategies.   Baseline Pt. has difficulty   Time 12   Period Weeks   Status New   Target Date 02/12/17               Plan - 11/25/16 1425    Clinical Impression Statement Pt. visual attention was assessed using the [employer]. Single letter search-simple 31/40 accurate responses in 1 min and 5 sec., with omissions on the left , right, and in the center, Single letter search-crowded 32/40 accurate responses  in 1 min and 8 sec. with most omissions on the left, word search with 26/30 accurate responses in 1 min and 29 sec., structured complex circles search 25/30 accurate responses in 54 sec., with 5 of the omissions on the left, random circles-simple 100% accuracy in30 sec. , random circles -crowded 100% accuracy in 52 sec. Pt. continues to work on visual scanning, visual search strategies, visual compensatory strategies, and LUE functioning for ADL nd IADL tasks.   Occupational performance deficits (Please refer to evaluation for details): ADL's;IADL's;Leisure   Rehab Potential Good   Current Impairments/barriers affecting progress: Positive Indicators: familiy support, Age. Negative Indicators: multiple comorbidities.   OT Frequency 2x / week   OT Duration 12 weeks   OT Treatment/Interventions Self-care/ADL training;Therapeutic exercise;Energy conservation;DME and/or AE instruction;Therapeutic activities;Therapeutic exercises;Manual Therapy;Neuromuscular education;Patient/family education;Visual/perceptual remediation/compensation;Cognitive remediation/compensation   Consulted and Agree with Plan of Care Family member/caregiver   Family Member Consulted sister-Elsie      Patient will benefit from skilled therapeutic intervention in order to improve the following deficits and impairments:  Pain, Impaired UE functional use, Decreased strength, Decreased coordination, Decreased cognition, Decreased range of motion, Decreased activity tolerance, Impaired vision/preception, Decreased safety awareness, Decreased balance, Decreased knowledge of precautions  Visit Diagnosis: Muscle weakness (generalized)  Other lack of coordination    Problem List Patient Active Problem List   Diagnosis Date Noted  . Primary adenocarcinoma of lower lobe of left lung ([place]) 09/04/2016    Harrel Carina, MS, OTR/L 11/25/2016, 2:38 PM  [place] MAIN [facility] SERVICES [address] [place], [place], [address] Phone: [phone]   Fax:  [phone]  Name: Monica Collins MRN: [id number] Date of Birth: [birth year]

## 2016-11-25 NOTE — Addendum Note (Signed)
Addended by: Lucia Bitter on: 11/25/2016 03:19 PM   Modules accepted: Orders

## 2016-11-27 ENCOUNTER — Ambulatory Visit: Payer: Medicare Other

## 2016-11-27 ENCOUNTER — Ambulatory Visit: Payer: Medicare Other | Admitting: Speech Pathology

## 2016-11-27 ENCOUNTER — Ambulatory Visit: Payer: Medicare Other | Admitting: Occupational Therapy

## 2016-11-27 DIAGNOSIS — H5340 Unspecified visual field defects: Secondary | ICD-10-CM | POA: Diagnosis not present

## 2016-12-02 ENCOUNTER — Ambulatory Visit: Payer: Medicare Other | Attending: Physical Medicine and Rehabilitation | Admitting: Occupational Therapy

## 2016-12-02 ENCOUNTER — Ambulatory Visit: Payer: Medicare Other

## 2016-12-02 ENCOUNTER — Ambulatory Visit: Payer: Medicare Other | Admitting: Speech Pathology

## 2016-12-02 DIAGNOSIS — M6281 Muscle weakness (generalized): Secondary | ICD-10-CM | POA: Diagnosis not present

## 2016-12-02 DIAGNOSIS — R41841 Cognitive communication deficit: Secondary | ICD-10-CM

## 2016-12-02 DIAGNOSIS — R2689 Other abnormalities of gait and mobility: Secondary | ICD-10-CM | POA: Insufficient documentation

## 2016-12-02 DIAGNOSIS — R278 Other lack of coordination: Secondary | ICD-10-CM | POA: Diagnosis not present

## 2016-12-02 NOTE — Therapy (Addendum)
[place] MAIN [facility] SERVICES [address] [place], [place], [address] Phone: [phone]   Fax:  [phone]  Occupational Therapy Treatment  Patient Details  Name: Monica Collins MRN: [id number] Date of Birth: 06-14-1945 Referring Provider: Dr. Dina Rich  Encounter Date: 12/02/2016      OT End of Session - 12/02/16 1520    Visit Number 3   Number of Visits 24   Date for OT Re-Evaluation 02/12/17   Authorization Type Medicare G code 3 of 10   OT Start Time 2706   OT Stop Time 1445   OT Time Calculation (min) 30 min   Activity Tolerance Patient tolerated treatment well   Behavior During Therapy [facility] for tasks assessed/performed      Past Medical History:  Diagnosis Date  . Hypertension     Past Surgical History:  Procedure Laterality Date  . CHOLECYSTECTOMY      There were no vitals filed for this visit.      Subjective Assessment - 12/02/16 1454    Subjective  Pt.'s daughter accompanied pt. to the treatment session.   Patient is accompained by: Family member   Currently in Pain? No/denies      OT TREATMENT    Therapeutic Exercise:  Pt. Performed hand strengthening with pink theraputty. Pt. required cues for proper technique. Pt. worked on gross grip, lateral pinch, 3pt. pinch, gross digit extension, thumb opposition, and lumbical ex,  Pt. required verbal and tactile cues for proper technique. Pt. Requires verbal cues, and visual demonstration.                         OT Education - 12/02/16 1520    Education provided Yes   Education Details Pink theraputty HEP.   Person(s) Educated Patient   Methods Explanation;Demonstration;Verbal cues   Comprehension Returned demonstration;Verbalized understanding             OT Long Term Goals - 11/20/16 1742      OT LONG TERM GOAL #1   Title Pt. will improve left hand [facility] skills by 5 sec. to assist with grooming.   Baseline R: 25, L: 47   Time 2   Period Weeks   Status New   Target Date 02/12/17     OT LONG TERM GOAL #2   Title Pt. will improve left grip strength by 5# to be able to open jars, bolttles   Baseline R: 32, L: 18   Time 12   Period Weeks   Status New   Target Date 02/12/17     OT LONG TERM GOAL #3   Title Pt. will improve left 3pt. pinch strength by 3# to be able to hold, and use a toothbrush.   Baseline R: 12#, L: 8#   Time 12   Period Weeks   Status New   Target Date 02/12/17     OT LONG TERM GOAL #4   Title Pt. will be independent with  UE HEP program   Baseline Assist required   Time 12   Period Weeks   Status New   Target Date 02/12/17     OT LONG TERM GOAL #5   Title Pt. will independently follow a simple cold recipe.   Baseline Pt. is unable   Time 12   Period Weeks   Status New   Target Date 02/12/17     OT LONG TERM GOAL #6   Title Pt. will scan to  the left 100% of the time with minimal cues to be able to navigate her environment during IADL tasks using visual compensatory strategies.   Baseline Pt. has difficulty   Time 12   Period Weeks   Status New   Target Date 02/12/17     OT LONG TERM GOAL #7   Title Pt. will scan to the left 100% of the time with minimal cues during tabletop ADL tasks, using visual compensatory strategies.   Baseline Pt. has difficulty   Time 12   Period Weeks   Status New   Target Date 02/12/17               Plan - 12/02/16 1522    Clinical Impression Statement Pt. was late for the treatment session. Pt. reports her eye doctor told them that her vision was at 80%. Pt. worked on revieweing a HEP for her left hand  strength using pink theraputty. Pt. family reports pt. has a been squeezing a stress ball at home, and has made progress. Pt. requires verbal cues, and visual demonstration for hand position and technique with the putty.    Occupational performance deficits (Please refer to evaluation for details): ADL's;IADL's;Leisure   Rehab Potential Good    Current Impairments/barriers affecting progress: Positive Indicators: familiy support, Age. Negative Indicators: multiple comorbidities.   OT Frequency 2x / week   OT Duration 12 weeks   OT Treatment/Interventions Self-care/ADL training;Therapeutic exercise;Energy conservation;DME and/or AE instruction;Therapeutic activities;Therapeutic exercises;Manual Therapy;Neuromuscular education;Patient/family education;Visual/perceptual remediation/compensation;Cognitive remediation/compensation   Consulted and Agree with Plan of Care Family member/caregiver      Patient will benefit from skilled therapeutic intervention in order to improve the following deficits and impairments:  Pain, Impaired UE functional use, Decreased strength, Decreased coordination, Decreased cognition, Decreased range of motion, Decreased activity tolerance, Impaired vision/preception, Decreased safety awareness, Decreased balance, Decreased knowledge of precautions  Visit Diagnosis: Muscle weakness (generalized)    Problem List Patient Active Problem List   Diagnosis Date Noted  . Primary adenocarcinoma of lower lobe of left lung ([place]) 09/04/2016    Harrel Carina, MS, OTR/L 12/02/2016, 3:27 PM  [place] MAIN [facility] SERVICES [address] [place], [place], [address] Phone: [phone]   Fax:  [phone]  Name: Monica Collins MRN: [id number] Date of Birth: [birth year]

## 2016-12-02 NOTE — Therapy (Signed)
[place] MAIN [facility] SERVICES [address] [place], [place], [address] Phone: [phone]   Fax:  [phone]  Physical Therapy Treatment  Patient Details  Name: Monica Collins MRN: [id number] Date of Birth: 09-08-1945 Referring Provider: Leretha Dykes, DO  Encounter Date: 12/02/2016      PT End of Session - 12/02/16 1703    Visit Number 2   Number of Visits 10   Date for PT Re-Evaluation 12/30/16   Authorization Type recheck goals 12/26/16.   Authorization - Visit Number 2   Authorization - Number of Visits 10   PT Start Time 1603   PT Stop Time 1648   PT Time Calculation (min) 45 min   Equipment Utilized During Treatment Gait belt   Activity Tolerance Patient tolerated treatment well   Behavior During Therapy [facility] for tasks assessed/performed      Past Medical History:  Diagnosis Date  . Hypertension     Past Surgical History:  Procedure Laterality Date  . CHOLECYSTECTOMY      There were no vitals filed for this visit.      Subjective Assessment - 12/02/16 1658    Subjective Patient is compliant with HEP. 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 Left lower lobe resection on 10-17-16. She was in the hospital for 30 days. 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    Limitations Lifting;Walking;House hold activities   How long can you sit comfortably? N/A   How long can you stand comfortably? 15 minutes   How long can you walk comfortably? 20 minutes   Patient Stated Goals do stuff around the house   Currently in Pain? Yes   Pain Score 1    Pain Location Back   Pain Orientation Upper   Pain Descriptors / Indicators Aching   Pain Type Acute pain   Pain Onset Today   Pain Frequency  Occasional   Aggravating Factors  bad posture     TherEx Scapular retractions 10x Shoulder circles 10x Side stepping with UE support 2x length of // bars, without UE support 4x length of // bars,  With RTB x 4 length of // bars  Hip extension 12x each leg, cues for upright posture Stepping over two half foam rollers and a hurdle with no UE support. Cues for leading with left and right Side stepping over hurdle and back 15x with finger tip support.    Neuro Re-ed  Tandem stance on Airex pad 2x60 seconds, 2x60 seconds eyes closed, sway to right airex normal standing position with head turns horizontal and vertical head turns 4x60 seconds no LOB Toe taps 6" step no UE support 20x Step up and down from 6" step with same leg 10x each leg, no LOB, more difficulty with R step down due to eccentric requirement of LLE. Eccentric step down 6' step finger tips  Dyna disc lunges 15x each leg   Pt. response to medical necessity:  Patient will benefit from skilled physical therapy to improve functional ambulation, balance, and LE strength for return to previous level of function.          PT Long Term Goals - 11/25/16 1736      PT LONG TERM GOAL #1  Title Patient will increase Berg Balance score by > 6 points (55/56) to demonstrate decreased fall risk during functional activities.   Baseline 7/30: 49/56   Time 6   Period Weeks   Status New   Target Date 12/23/16     PT LONG TERM GOAL #2   Title Patient will increase 10 meter walk test to >1.13m/s as to improve gait speed for better community ambulation and to reduce fall risk.   Baseline 7/30: .71   Time 6   Period Weeks   Status New   Target Date 12/23/16     PT LONG TERM GOAL #3   Title Patient will increase ABC scale score >80% to demonstrate better functional mobility and better confidence with ADLs.    Baseline 7/30: 40%   Time 6   Period Weeks   Status New   Target Date 12/23/16     PT LONG TERM GOAL #4   Title Patient  will increase lower extremity functional scale to >50/80 to demonstrate improved functional mobility and increased tolerance with ADLs.    Baseline 7/30: 24/80   Time 6   Period Weeks   Status New   Target Date 12/23/16     PT LONG TERM GOAL #5   Title Patient (> 48 years old) will complete five times sit to stand test in < 15 seconds indicating an increased LE strength and improved balance.   Baseline 7/30 18 sec   Time 6   Period Weeks   Status New   Target Date 12/23/16               Plan - 12/02/16 1706    Clinical Impression Statement Patient demonstrated improved ambulatory mechanics and balance due to improved LE strength. LLE continues to be affected requiring more frequent verbal and visual (mirror) cues for proper alignment and upright balance. Step negotiation continues to be challenging to pt. Upright posture relieved upper back pain and pt. Demonstrated understanding of importance of proper posture. Patient will benefit from skilled physical therapy to improve functional ambulation, balance, and LE strength for return to previous level of function.    Rehab Potential Fair   Clinical Impairments Affecting Rehab Potential This patient presents with 3, personal factors/ comorbidities and 3 ,   body elements including body structures and functions, activity limitations and or participation restrictions. Patient's condition is evolving.   PT Frequency 2x / week   PT Duration 6 weeks   PT Treatment/Interventions ADLs/Self Care Home Management;Biofeedback;Cryotherapy;Scientist, product/process development;Iontophoresis 4mg /ml Dexamethasone;Moist Heat;Ultrasound;Traction;Gait training;Stair training;Functional mobility training;Therapeutic activities;Therapeutic exercise;Balance training;Patient/family education;Cognitive remediation;Neuromuscular re-education;Manual techniques;Compression bandaging;Passive range of motion;Energy conservation;Taping;Visual/perceptual  remediation/compensation   PT Next Visit Plan stairs, walking, balance, strength   PT Home Exercise Plan see sheet   Consulted and Agree with Plan of Care Patient;Family member/caregiver      Patient will benefit from skilled therapeutic intervention in order to improve the following deficits and impairments:  Abnormal gait, Decreased activity tolerance, Decreased balance, Decreased knowledge of precautions, Decreased endurance, Decreased coordination, Decreased cognition, Decreased knowledge of use of DME, Decreased mobility, Decreased range of motion, Decreased safety awareness, Difficulty walking, Decreased strength, Impaired flexibility, Impaired perceived functional ability, Impaired vision/preception, Postural dysfunction, Improper body mechanics, Pain  Visit Diagnosis: Muscle weakness (generalized)  Other abnormalities of gait and mobility     Problem List Patient Active Problem List   Diagnosis Date Noted  . Primary adenocarcinoma of lower lobe of left lung ([place]) 09/04/2016   Janna Arch, PT, DPT  Janna Arch 12/02/2016, 5:08 PM  [place] MAIN [facility] SERVICES [address] [place], [place], [address] Phone: [phone]   Fax:  [phone]  Name: OLIVIA PAVELKO MRN: [id number] Date of Birth: 01-20-1946

## 2016-12-03 ENCOUNTER — Encounter: Payer: Self-pay | Admitting: Speech Pathology

## 2016-12-03 NOTE — Therapy (Signed)
[place] MAIN [facility] SERVICES [address] [place], [place], [address] Phone: [phone]   Fax:  [phone]  Speech Language Pathology Treatment  Patient Details  Name: Monica Collins MRN: [id number] Date of Birth: [birth year] Referring Provider: Dr. Dina Rich  Encounter Date: 12/02/2016      End of Session - 12/03/16 1533    Visit Number 3   Number of Visits 17   Date for SLP Re-Evaluation 01/21/17   SLP Start Time 1500   SLP Stop Time  1556   SLP Time Calculation (min) 56 min   Activity Tolerance Patient tolerated treatment well      Past Medical History:  Diagnosis Date  . Hypertension     Past Surgical History:  Procedure Laterality Date  . CHOLECYSTECTOMY      There were no vitals filed for this visit.      Subjective Assessment - 12/03/16 1532    Subjective Patient has brighter affect   Patient is accompained by: Family member   Currently in Pain? No/denies               ADULT SLP TREATMENT - 12/03/16 0001      General Information   Behavior/Cognition Alert;Cooperative;Pleasant mood   HPI Patient is a 71 year old woman who suffered a right MCA aneurysm; right Craniectomy and Clip Ligation of aneurysm performed on 10/23/16. Patient's neurological state deteriorated on 10/29/16, and head CT showed edema around aneurysm clip site. She spontaneously improved to her baseline that same day. On 10/31/16, patient's family noticed patient was having seizure-like activity with pill-rolling hand movements. Patient was monitored on an EEG and no electrographic seizures were noted over 48 hours of continuous monitoring. She was evaluated by SLP for swallowing and is on a regular diet with thin liquids as of 11/04/16. SLP noted that patient has low vocal intensity. Patient was [facility] for oral mech exam, orientation, and memory evaluations but demonstrated deficits in problem solving, safety/judgement, visual perception (left neglect),  comprehension, and pragmatics/prosody. SLP recommended intensive speech therapy. 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 Patient's prior level of functioning was [facility] before the aneurysm.       Treatment Provided   Treatment provided Cognitive-Linquistic     Pain Assessment   Pain Assessment No/denies pain     Cognitive-Linquistic Treatment   Treatment focused on Cognition;Patient/family/caregiver education   Skilled Treatment VISUOSPATIAL SKILLS: Complete simple attention worksheets with 90% accuracy independently.  LANGUAGE/REASONING SKILLS: Name commonalities with 75% accuracy.  Complete simple verbal analogies with 90% accuracy.        Assessment / Recommendations / Plan   Plan Continue with current plan of care     Progression Toward Goals   Progression toward goals Progressing toward goals          SLP Education - 12/03/16 1533    Education provided Yes   Education Details engage in activities   Person(s) Educated Patient;Child(ren)   Methods Explanation   Comprehension Verbalized understanding            SLP Long Term Goals - 11/21/16 3790      SLP LONG TERM GOAL #1   Title Patient will identify cognitive barriers and participate in developing functional compensatory strategies.   Time 8   Period Weeks   Status New     SLP LONG TERM GOAL #2   Title Patient will demonstrate functional cognitive-communication skills for independent  completion of personal responsibilities.    Time 8   Period Weeks   Status New     SLP LONG TERM GOAL #3   Title Patient will complete complex visual-spatial activities with 80% accuracy.   Time 8   Period Weeks   Status New     SLP LONG TERM GOAL #4   Title Patient will complete attention, executive function skills, and memory strategy activities with 80% accuracy.   Time 8   Period Weeks   Status New          Plan - 12/03/16 1533    Clinical Impression  Statement The patient is demonstrating good sustained attention for task done today.  She demonstrates increased initiation of speech and a brighter affect.  She demonstrates improved processing speed.  She is able to read directions for tasks and follow through.  She responds to feedback and can improve performance when cued.   Speech Therapy Frequency 2x / week   Duration Other (comment)   Treatment/Interventions Compensatory techniques;Patient/family education;Compensatory strategies;SLP instruction and feedback;Functional tasks   Potential to Achieve Goals Good   Potential Considerations Ability to learn/carryover information;Cooperation/participation level;Family/community support   Consulted and Agree with Plan of Care Patient;Family member/caregiver   Family Member Consulted Granddaughter      Patient will benefit from skilled therapeutic intervention in order to improve the following deficits and impairments:   Cognitive communication deficit    Problem List Patient Active Problem List   Diagnosis Date Noted  . Primary adenocarcinoma of lower lobe of left lung ([place]) 09/04/2016   [name], MS/CCC- SLP  [name] 12/03/2016, 3:34 PM  [place] MAIN [facility] SERVICES [address] [place], [place], [address] Phone: [phone]   Fax:  [phone]   Name: MELANEE CORDIAL MRN: [id number] Date of Birth: 03-27-1946

## 2016-12-04 ENCOUNTER — Ambulatory Visit: Payer: Medicare Other | Admitting: Occupational Therapy

## 2016-12-04 ENCOUNTER — Ambulatory Visit: Payer: Medicare Other | Admitting: Speech Pathology

## 2016-12-04 ENCOUNTER — Ambulatory Visit: Payer: Medicare Other

## 2016-12-04 DIAGNOSIS — R278 Other lack of coordination: Secondary | ICD-10-CM | POA: Diagnosis not present

## 2016-12-04 DIAGNOSIS — M6281 Muscle weakness (generalized): Secondary | ICD-10-CM

## 2016-12-04 DIAGNOSIS — R41841 Cognitive communication deficit: Secondary | ICD-10-CM | POA: Diagnosis not present

## 2016-12-04 DIAGNOSIS — R2689 Other abnormalities of gait and mobility: Secondary | ICD-10-CM

## 2016-12-05 ENCOUNTER — Encounter: Payer: Self-pay | Admitting: Speech Pathology

## 2016-12-05 NOTE — Therapy (Signed)
[place] MAIN [facility] SERVICES [address] [place], [place], [address] Phone: [phone]   Fax:  [phone]  Speech Language Pathology Treatment  Patient Details  Name: Monica Collins MRN: [id number] Date of Birth: 01/25/1946 Referring Provider: Dr. Dina Rich  Encounter Date: 12/04/2016      End of Session - 12/05/16 0926    Visit Number 4   Number of Visits 17   Date for SLP Re-Evaluation 01/21/17   SLP Start Time 8185   SLP Stop Time  1500   SLP Time Calculation (min) 58 min   Activity Tolerance Patient tolerated treatment well      Past Medical History:  Diagnosis Date  . Hypertension     Past Surgical History:  Procedure Laterality Date  . CHOLECYSTECTOMY      There were no vitals filed for this visit.      Subjective Assessment - 12/05/16 0925    Subjective Patient has brighter affect   Patient is accompained by: Family member   Currently in Pain? No/denies               ADULT SLP TREATMENT - 12/05/16 0001      General Information   Behavior/Cognition Alert;Cooperative;Pleasant mood   HPI Patient is a 71 year old woman who suffered a right MCA aneurysm; right Craniectomy and Clip Ligation of aneurysm performed on 10/23/16. Patient's neurological state deteriorated on 10/29/16, and head CT showed edema around aneurysm clip site. She spontaneously improved to her baseline that same day. On 10/31/16, patient's family noticed patient was having seizure-like activity with pill-rolling hand movements. Patient was monitored on an EEG and no electrographic seizures were noted over 48 hours of continuous monitoring. She was evaluated by SLP for swallowing and is on a regular diet with thin liquids as of 11/04/16. SLP noted that patient has low vocal intensity. Patient was [facility] for oral mech exam, orientation, and memory evaluations but demonstrated deficits in problem solving, safety/judgement, visual perception (left neglect),  comprehension, and pragmatics/prosody. SLP recommended intensive speech therapy. 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 Patient's prior level of functioning was [facility] before the aneurysm.       Treatment Provided   Treatment provided Cognitive-Linquistic     Pain Assessment   Pain Assessment No/denies pain     Cognitive-Linquistic Treatment   Treatment focused on Cognition;Patient/family/caregiver education   Skilled Treatment VISUAL PROCESSING: Complete moderately complex visual processing activities with overall 80% accuracy.  LANGUAGE/COGNITIVE SKILLS: Read and complete syllogisms with 75% accuracy.  Read directions and complete after distractor with 75% accuracy.        Assessment / Recommendations / Plan   Plan Continue with current plan of care     Progression Toward Goals   Progression toward goals Progressing toward goals          SLP Education - 12/05/16 0925    Education provided Yes   Education Details Atttend to visual and auditory information   Person(s) Educated Patient;Child(ren)   Methods Explanation   Comprehension Verbalized understanding            SLP Long Term Goals - 11/21/16 6314      SLP LONG TERM GOAL #1   Title Patient will identify cognitive barriers and participate in developing functional compensatory strategies.   Time 8   Period Weeks   Status New     SLP LONG TERM GOAL #2  Title Patient will demonstrate functional cognitive-communication skills for independent completion of personal responsibilities.    Time 8   Period Weeks   Status New     SLP LONG TERM GOAL #3   Title Patient will complete complex visual-spatial activities with 80% accuracy.   Time 8   Period Weeks   Status New     SLP LONG TERM GOAL #4   Title Patient will complete attention, executive function skills, and memory strategy activities with 80% accuracy.   Time 8   Period Weeks   Status New           Plan - 12/05/16 4540    Clinical Impression Statement The patient is demonstrating good sustained attention for tasks done today.  She demonstrates increased initiation of speech and a brighter affect.  She demonstrates improved processing speed.  She is able to read directions for tasks and follow through.  She responds to feedback and can improve performance when cued.   Speech Therapy Frequency 2x / week   Duration Other (comment)   Treatment/Interventions Compensatory techniques;Patient/family education;Compensatory strategies;SLP instruction and feedback;Functional tasks   Potential to Achieve Goals Good   Potential Considerations Ability to learn/carryover information;Cooperation/participation level;Family/community support   Consulted and Agree with Plan of Care Patient;Family member/caregiver   Family Member Consulted Granddaughter      Patient will benefit from skilled therapeutic intervention in order to improve the following deficits and impairments:   Cognitive communication deficit    Problem List Patient Active Problem List   Diagnosis Date Noted  . Primary adenocarcinoma of lower lobe of left lung ([place]) 09/04/2016   [name], MS/CCC- SLP  [name] 12/05/2016, 9:27 AM  [place] MAIN [facility] SERVICES [address] [place], [place], [address] Phone: [phone]   Fax:  [phone]   Name: Monica Collins MRN: [id number] Date of Birth: 1946/01/27

## 2016-12-05 NOTE — Therapy (Signed)
[place] MAIN [facility] SERVICES [address] [place], [place], [address] Phone: [phone]   Fax:  [phone]  Physical Therapy Treatment  Patient Details  Name: Monica Collins MRN: [id number] Date of Birth: May 21, 1945 Referring Provider: Leretha Dykes, DO  Encounter Date: 12/04/2016      PT End of Session - 12/05/16 1131    Visit Number 3   Number of Visits 10   Date for PT Re-Evaluation 12/30/16   Authorization Type recheck goals 12/26/16.   Authorization - Visit Number 3   Authorization - Number of Visits 10   PT Start Time 1603   PT Stop Time 1648   PT Time Calculation (min) 45 min   Equipment Utilized During Treatment Gait belt   Activity Tolerance Patient tolerated treatment well   Behavior During Therapy WFL for tasks assessed/performed      Past Medical History:  Diagnosis Date  . Hypertension     Past Surgical History:  Procedure Laterality Date  . CHOLECYSTECTOMY      There were no vitals filed for this visit. TherEx     Subjective Assessment - 12/05/16 1130    Subjective Patient reports doing more at home. She is accompanied by family member who supports this and states that they are going to a birthday party this weekend.    Patient is accompained by: Family member   Pertinent History Patient is a 71 year old female who was admitted[facility] after a Right craniectomy and clip ligation for a R MCA Aneurysm on 10-23-16. Left lower lobe resection on 10-17-16. She was in the hospital for 30 days. 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    Limitations Lifting;Walking;House hold activities   How long can you sit comfortably? N/A   How long can you stand comfortably? 15 minutes   How long can you walk comfortably? 20 minutes   Patient Stated Goals do stuff around the house   Currently in Pain? Yes   Pain Score 4    Pain Location Back   Pain Orientation Mid   Pain Descriptors / Indicators Aching   Pain Type  Acute pain   Pain Radiating Towards moves around   Pain Onset Yesterday   Pain Frequency Occasional     Walking around 7 cones in hallway (weaving) x2 no LOB, CGA Figure 8 in hallway around two cones x 8 Walking in hallway horiozontal head turns, vertical head turns LOB x4 CGA Side stepping around square 8x Turning around cone color PT directs 16x, pt. Had difficulty sequencing task  Step over 2 consecutive hurdles and up 6" step to throw ball and back x 15 Agility ladder with decreasing UE support one step in each box to improve step length Side stepping in // bars with modified squat and RTB   Neuro Re-ed Airex balance beam forward, side step, static stand Airex pad balance: ball toss, balloon pass Opp arm and leg raises x 4 at a time, x 3 at a time, x2, at a time, to 1at a time switching between each one 15x.  Standing opposite arm and leg 10x          PT Long Term Goals - 11/25/16 1736      PT LONG TERM GOAL #1   Title Patient will increase Berg Balance score by > 6 points (55/56) to demonstrate decreased fall risk during functional activities.   Baseline 7/30: 49/56   Time 6   Period Weeks  Status New   Target Date 12/23/16     PT LONG TERM GOAL #2   Title Patient will increase 10 meter walk test to >1.22m/s as to improve gait speed for better community ambulation and to reduce fall risk.   Baseline 7/30: .71   Time 6   Period Weeks   Status New   Target Date 12/23/16     PT LONG TERM GOAL #3   Title Patient will increase ABC scale score >80% to demonstrate better functional mobility and better confidence with ADLs.    Baseline 7/30: 40%   Time 6   Period Weeks   Status New   Target Date 12/23/16     PT LONG TERM GOAL #4   Title Patient will increase lower extremity functional scale to >50/80 to demonstrate improved functional mobility and increased tolerance with ADLs.    Baseline 7/30: 24/80   Time 6   Period Weeks   Status New   Target Date 12/23/16      PT LONG TERM GOAL #5   Title Patient (> 16 years old) will complete five times sit to stand test in < 15 seconds indicating an increased LE strength and improved balance.   Baseline 7/30 18 sec   Time 6   Period Weeks   Status New   Target Date 12/23/16               Plan - 12/05/16 1134    Clinical Impression Statement Patient presents with improved dynamic balance and sequencing between UE and LE. Patient continues to be challenged by head turns while ambulating. Balance on dynamic surfaces continues to progress with pt. reaching outside of COM. Static balance continues to improve in standing. Scuffing of shoes and heavy footsteps decreased with frequent verbal cues due to pt.'s limited awareness of body mechanics when distracted.  Patient will continue to benefit from skilled physical therapy to improve functional ambulation, balance, and LE strength for return to previous level of function.   Rehab Potential Fair   Clinical Impairments Affecting Rehab Potential This patient presents with 3, personal factors/ comorbidities and 3 ,   body elements including body structures and functions, activity limitations and or participation restrictions. Patient's condition is evolving.   PT Frequency 2x / week   PT Duration 6 weeks   PT Treatment/Interventions ADLs/Self Care Home Management;Biofeedback;Cryotherapy;Scientist, product/process development;Iontophoresis 4mg /ml Dexamethasone;Moist Heat;Ultrasound;Traction;Gait training;Stair training;Functional mobility training;Therapeutic activities;Therapeutic exercise;Balance training;Patient/family education;Cognitive remediation;Neuromuscular re-education;Manual techniques;Compression bandaging;Passive range of motion;Energy conservation;Taping;Visual/perceptual remediation/compensation   PT Next Visit Plan stairs, walking, balance, strength   PT Home Exercise Plan see sheet   Consulted and Agree with Plan of Care Patient;Family member/caregiver       Patient will benefit from skilled therapeutic intervention in order to improve the following deficits and impairments:  Abnormal gait, Decreased activity tolerance, Decreased balance, Decreased knowledge of precautions, Decreased endurance, Decreased coordination, Decreased cognition, Decreased knowledge of use of DME, Decreased mobility, Decreased range of motion, Decreased safety awareness, Difficulty walking, Decreased strength, Impaired flexibility, Impaired perceived functional ability, Impaired vision/preception, Postural dysfunction, Improper body mechanics, Pain  Visit Diagnosis: Muscle weakness (generalized)  Other abnormalities of gait and mobility  Other lack of coordination     Problem List Patient Active Problem List   Diagnosis Date Noted  . Primary adenocarcinoma of lower lobe of left lung ([place]) 09/04/2016  Janna Arch, PT, DPT    Janna Arch 12/05/2016, 11:35 AM  [place] MAIN [facility] SERVICES [address]  [place], [place], [address] Phone: [phone]   Fax:  [phone]  Name: Monica Collins MRN: [id number] Date of Birth: 1945/12/02

## 2016-12-09 ENCOUNTER — Ambulatory Visit: Payer: Medicare Other

## 2016-12-09 ENCOUNTER — Ambulatory Visit: Payer: Medicare Other | Admitting: Speech Pathology

## 2016-12-09 ENCOUNTER — Ambulatory Visit: Payer: Medicare Other | Admitting: Occupational Therapy

## 2016-12-09 DIAGNOSIS — R41841 Cognitive communication deficit: Secondary | ICD-10-CM

## 2016-12-09 DIAGNOSIS — M6281 Muscle weakness (generalized): Secondary | ICD-10-CM

## 2016-12-09 DIAGNOSIS — R2689 Other abnormalities of gait and mobility: Secondary | ICD-10-CM | POA: Diagnosis not present

## 2016-12-09 DIAGNOSIS — R278 Other lack of coordination: Secondary | ICD-10-CM | POA: Diagnosis not present

## 2016-12-09 NOTE — Therapy (Signed)
[place] MAIN [facility] SERVICES [address] [place], [place], [address] Phone: [phone]   Fax:  [phone]  Occupational Therapy Treatment  Patient Details  Name: Monica Collins MRN: [id number] Date of Birth: [birth year] Referring Provider: Dr. Dina Rich  Encounter Date: 12/09/2016      OT End of Session - 12/09/16 1549    Visit Number 4   Number of Visits 24   Date for OT Re-Evaluation 02/12/17   Authorization Type Medicare G code 4 of 10   OT Start Time 5643   OT Stop Time 1515   OT Time Calculation (min) 42 min   Activity Tolerance Patient tolerated treatment well   Behavior During Therapy [facility] for tasks assessed/performed      Past Medical History:  Diagnosis Date  . Hypertension     Past Surgical History:  Procedure Laterality Date  . CHOLECYSTECTOMY      There were no vitals filed for this visit.      Subjective Assessment - 12/09/16 1548    Subjective  Pt. went to visit her daughter in [place] this past weekend.   Patient is accompained by: Family member   Currently in Pain? No/denies      OT TREATMENT    Neuro muscular re-education:  Pt. performed [facility] skills training to improve speed and dexterity needed for ADL tasks and writing. Pt. demonstrated grasping 1 inch sticks,  inch cylindrical collars, and  inch flat washers on the Purdue pegboard. Pt. performed grasping each item with her 2nd digit and thumb, and storing them in the palm. Pt. presented with difficulty storing  inch objects at a time in the palmar aspect of the hand. Pt. Worked on bilateral alternating hand patterns.  Therapeutic Exercise:  Pt. performed gross gripping with grip strengthener. Pt. worked on sustaining grip while grasping pegs and reaching at various heights. Gripper was placed in the 1st resistive slot with the white resistive spring. Pt. Worked on pinch strengthening in the left hand for lateral, and 3pt. pinch using green, and  blue resistive clips. Pt. worked on placing the clips at various vertical and horizontal angles. Tactile and verbal cues were required for eliciting the desired movement. Pt. performed resistive EZ Board exercises for forearm supination/pronation, wrist flexion/extension using gross grasp, and lateral pinch (key) grasp. Pt. performed resistive EZ Board exercises angled in several planes to promote shoulder flexion, abduction, and wrist flexion, and extension while performing resistive wrist flexion and extension with a gross grip.                             OT Education - 12/09/16 1549    Education provided Yes   Education Details LUE strength, and coordination skills   Person(s) Educated Patient;Child(ren)   Methods Explanation   Comprehension Verbalized understanding;Returned demonstration          OT Short Term Goals - 11/20/16 1720      OT SHORT TERM GOAL #1   Title --   Baseline --   Time --   Period --   Status --   Target Date --     OT SHORT TERM GOAL #2   Title --   Baseline --   Time --   Period --   Status --   Target Date --     OT SHORT TERM GOAL #3   Title --   Baseline --   Time --  Period --   Status --   Target Date --     OT SHORT TERM GOAL #4   Title --   Baseline --   Time --   Period --   Status --   Target Date --     OT SHORT TERM GOAL #5   Title --   Baseline --   Time --   Period --   Status --   Target Date --     OT SHORT TERM GOAL #6   Title --   Baseline --   Time --   Period --   Status --   Target Date --     OT SHORT TERM GOAL #7   Title --   Baseline --   Time --   Period --   Status --   Target Date --           OT Long Term Goals - 11/20/16 1742      OT LONG TERM GOAL #1   Title Pt. will improve left hand [facility] skills by 5 sec. to assist with grooming.   Baseline R: 25, L: 47   Time 2   Period Weeks   Status New   Target Date 02/12/17     OT LONG TERM GOAL #2   Title Pt.  will improve left grip strength by 5# to be able to open jars, bolttles   Baseline R: 32, L: 18   Time 12   Period Weeks   Status New   Target Date 02/12/17     OT LONG TERM GOAL #3   Title Pt. will improve left 3pt. pinch strength by 3# to be able to hold, and use a toothbrush.   Baseline R: 12#, L: 8#   Time 12   Period Weeks   Status New   Target Date 02/12/17     OT LONG TERM GOAL #4   Title Pt. will be independent with  UE HEP program   Baseline Assist required   Time 12   Period Weeks   Status New   Target Date 02/12/17     OT LONG TERM GOAL #5   Title Pt. will independently follow a simple cold recipe.   Baseline Pt. is unable   Time 12   Period Weeks   Status New   Target Date 02/12/17     OT LONG TERM GOAL #6   Title Pt. will scan to the left 100% of the time with minimal cues to be able to navigate her environment during IADL tasks using visual compensatory strategies.   Baseline Pt. has difficulty   Time 12   Period Weeks   Status New   Target Date 02/12/17     OT LONG TERM GOAL #7   Title Pt. will scan to the left 100% of the time with minimal cues during tabletop ADL tasks, using visual compensatory strategies.   Baseline Pt. has difficulty   Time 12   Period Weeks   Status New   Target Date 02/12/17               Plan - 12/09/16 1550    Clinical Impression Statement Pt. walked in from the hospital entrance alone today. Pt. reports she feels like she is improving daily.  Pt. continues to present with LUE weakness, and incoordination. Pt. could benefit from skilled OT services to improve UE strength, and coordination skills for improved engagement in ADL, and IADL tasks.  Occupational performance deficits (Please refer to evaluation for details): ADL's;IADL's;Leisure   Rehab Potential Good   Current Impairments/barriers affecting progress: Positive Indicators: familiy support, Age. Negative Indicators: multiple comorbidities.   OT Frequency  2x / week   OT Treatment/Interventions Self-care/ADL training;Therapeutic exercise;Energy conservation;DME and/or AE instruction;Therapeutic activities;Therapeutic exercises;Manual Therapy;Neuromuscular education;Patient/family education;Visual/perceptual remediation/compensation;Cognitive remediation/compensation   Consulted and Agree with Plan of Care Family member/caregiver   Family Member Consulted sister-Elsie      Patient will benefit from skilled therapeutic intervention in order to improve the following deficits and impairments:  Pain, Impaired UE functional use, Decreased strength, Decreased coordination, Decreased cognition, Decreased range of motion, Decreased activity tolerance, Impaired vision/preception, Decreased safety awareness, Decreased balance, Decreased knowledge of precautions  Visit Diagnosis: Muscle weakness (generalized)  Other lack of coordination    Problem List Patient Active Problem List   Diagnosis Date Noted  . Primary adenocarcinoma of lower lobe of left lung ([place]) 09/04/2016    Harrel Carina 12/09/2016, 3:57 PM  [place] MAIN [facility] SERVICES [address] [place], [place], [address] Phone: [phone]   Fax:  [phone]  Name: Monica Collins MRN: [id number] Date of Birth: 10/13/1945

## 2016-12-09 NOTE — Therapy (Signed)
[place] MAIN [facility] SERVICES [address] [place], [place], [address] Phone: [phone]   Fax:  [phone]  Physical Therapy Treatment  Patient Details  Name: Monica Collins MRN: [id number] Date of Birth: 1946/04/02 Referring Provider: Leretha Dykes, DO  Encounter Date: 12/09/2016      PT End of Session - 12/09/16 1606    Visit Number 4   Number of Visits 10   Date for PT Re-Evaluation 12/30/16   Authorization Type recheck goals 12/26/16.   Authorization - Visit Number 4   Authorization - Number of Visits 10   PT Start Time 3154   PT Stop Time 1600   PT Time Calculation (min) 45 min   Equipment Utilized During Treatment Gait belt   Activity Tolerance Patient tolerated treatment well   Behavior During Therapy WFL for tasks assessed/performed      Past Medical History:  Diagnosis Date  . Hypertension     Past Surgical History:  Procedure Laterality Date  . CHOLECYSTECTOMY      There were no vitals filed for this visit.      Subjective Assessment - 12/09/16 1602    Subjective Patient went to a families home over the weekend. Patient continues to progress with functional mobility.    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 Left lower lobe resection on 10-17-16. She was in the hospital for 30 days. 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    Limitations Lifting;Walking;House hold activities   How long can you sit comfortably? N/A   How long can you stand comfortably? 15 minutes   How long can you walk comfortably? 20 minutes   Patient Stated Goals do stuff around the house   Currently in Pain? No/denies     Neuro Re-ed Tandem airex pad, more LOB with RLE forward airex pad toe tap 6' step 20x airex pad side step 6" step 15x Ambulating in gym head turns reading  cards 2x, missing 1x each trial Stairs ascend/descend single UE support  Eccentric heel tap 12x each leg from 6" step  Therex Monster walks with YTB 4x length of // bars Side stepping in // bars with YTB around ankles 4x length of bars  Side stepping over and back over orange hurdle 15x each leg mini squats 2x10, cues for keeping legs shoulder width apart standing hamstring curls 12x each leg UE support Standing marches with no UE support 20x  Scapular retractions seated 10x Back extension with rolled towel behind back seated , cues of "catch some sun rays" Pf Heel raises in // bars 12x Df Toe raises in // bars 12 x    Pt. response to medical necessity: . Patient will continue to benefit from skilled physical therapy to improve functional ambulation, balance, and LE strength for return to previous level of function.                        PT Education - 12/09/16 1606    Education provided Yes   Education Details back pain body mechanics and upright posture   Person(s) Educated Patient   Methods Explanation;Demonstration;Verbal cues   Comprehension Verbalized understanding;Returned demonstration             PT Long Term Goals - 11/25/16 1736  PT LONG TERM GOAL #1   Title Patient will increase Berg Balance score by > 6 points (55/56) to demonstrate decreased fall risk during functional activities.   Baseline 7/30: 49/56   Time 6   Period Weeks   Status New   Target Date 12/23/16     PT LONG TERM GOAL #2   Title Patient will increase 10 meter walk test to >1.3m/s as to improve gait speed for better community ambulation and to reduce fall risk.   Baseline 7/30: .71   Time 6   Period Weeks   Status New   Target Date 12/23/16     PT LONG TERM GOAL #3   Title Patient will increase ABC scale score >80% to demonstrate better functional mobility and better confidence with ADLs.    Baseline 7/30: 40%   Time 6   Period Weeks   Status New   Target  Date 12/23/16     PT LONG TERM GOAL #4   Title Patient will increase lower extremity functional scale to >50/80 to demonstrate improved functional mobility and increased tolerance with ADLs.    Baseline 7/30: 24/80   Time 6   Period Weeks   Status New   Target Date 12/23/16     PT LONG TERM GOAL #5   Title Patient (> 78 years old) will complete five times sit to stand test in < 15 seconds indicating an increased LE strength and improved balance.   Baseline 7/30 18 sec   Time 6   Period Weeks   Status New   Target Date 12/23/16               Plan - 12/09/16 1619    Clinical Impression Statement Patient demonstrates LE weakness that attributes to decreased mobility in daily life. Patient demonstrates improved ability to stand for longer durations without fatiguing. Standing strengthening exercises implemented with pt. Requiring occasional cueing for increasing her BOS. Left neglect minimally noted with pt. Having more difficulty when weight bearing on LLE. Patient will continue to benefit from skilled physical therapy to improve functional ambulation, balance, and LE strength for return to previous level of function.    Rehab Potential Fair   Clinical Impairments Affecting Rehab Potential This patient presents with 3, personal factors/ comorbidities and 3 ,   body elements including body structures and functions, activity limitations and or participation restrictions. Patient's condition is evolving.   PT Frequency 2x / week   PT Duration 6 weeks   PT Treatment/Interventions ADLs/Self Care Home Management;Biofeedback;Cryotherapy;Scientist, product/process development;Iontophoresis 4mg /ml Dexamethasone;Moist Heat;Ultrasound;Traction;Gait training;Stair training;Functional mobility training;Therapeutic activities;Therapeutic exercise;Balance training;Patient/family education;Cognitive remediation;Neuromuscular re-education;Manual techniques;Compression bandaging;Passive range of  motion;Energy conservation;Taping;Visual/perceptual remediation/compensation   PT Next Visit Plan stairs, walking, balance, strength   PT Home Exercise Plan see sheet   Consulted and Agree with Plan of Care Patient;Family member/caregiver      Patient will benefit from skilled therapeutic intervention in order to improve the following deficits and impairments:  Abnormal gait, Decreased activity tolerance, Decreased balance, Decreased knowledge of precautions, Decreased endurance, Decreased coordination, Decreased cognition, Decreased knowledge of use of DME, Decreased mobility, Decreased range of motion, Decreased safety awareness, Difficulty walking, Decreased strength, Impaired flexibility, Impaired perceived functional ability, Impaired vision/preception, Postural dysfunction, Improper body mechanics, Pain  Visit Diagnosis: Muscle weakness (generalized)  Other lack of coordination  Other abnormalities of gait and mobility     Problem List Patient Active Problem List   Diagnosis Date Noted  . 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

## 2016-12-10 NOTE — Therapy (Signed)
[place] MAIN [facility] SERVICES [address] [place], [place], [address] Phone: [phone]   Fax:  [phone]  Speech Language Pathology Treatment  Patient Details  Name: TAKIERA MAYO MRN: [id number] Date of Birth: 1945-12-22 Referring Provider: Dr. Dina Rich  Encounter Date: 12/09/2016      End of Session - 12/10/16 0902    Visit Number 5   Number of Visits 17   Date for SLP Re-Evaluation 01/21/17   SLP Start Time 1600   SLP Stop Time  1652   SLP Time Calculation (min) 52 min   Activity Tolerance Patient tolerated treatment well      Past Medical History:  Diagnosis Date  . Hypertension     Past Surgical History:  Procedure Laterality Date  . CHOLECYSTECTOMY      There were no vitals filed for this visit.      Subjective Assessment - 12/10/16 0901    Subjective Patient has brighter affect   Currently in Pain? No/denies               ADULT SLP TREATMENT - 12/10/16 0001      General Information   Behavior/Cognition Alert;Cooperative;Pleasant mood   HPI Patient is a 71 year old woman who suffered a right MCA aneurysm; right Craniectomy and Clip Ligation of aneurysm performed on 10/23/16. Patient's neurological state deteriorated on 10/29/16, and head CT showed edema around aneurysm clip site. She spontaneously improved to her baseline that same day. On 10/31/16, patient's family noticed patient was having seizure-like activity with pill-rolling hand movements. Patient was monitored on an EEG and no electrographic seizures were noted over 48 hours of continuous monitoring. She was evaluated by SLP for swallowing and is on a regular diet with thin liquids as of 11/04/16. SLP noted that patient has low vocal intensity. Patient was [facility] for oral mech exam, orientation, and memory evaluations but demonstrated deficits in problem solving, safety/judgement, visual perception (left neglect), comprehension, and pragmatics/prosody. SLP  recommended intensive speech therapy. 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 Patient's prior level of functioning was [facility] before the aneurysm.       Treatment Provided   Treatment provided Cognitive-Linquistic     Pain Assessment   Pain Assessment No/denies pain     Cognitive-Linquistic Treatment   Treatment focused on Cognition;Patient/family/caregiver education   Skilled Treatment VISUAL PROCESSING: Complete moderately complex visual processing activities with overall 80% accuracy.  LANGUAGE/COGNITIVE SKILLS: complete Perplxors logic problems with mod-max SLP cues to recall processes to aid elimination reasoning and reason cues.        Assessment / Recommendations / Plan   Plan Continue with current plan of care     Progression Toward Goals   Progression toward goals Progressing toward goals          SLP Education - 12/10/16 0901    Education provided Yes   Education Details attend to details and engage in activities   Person(s) Educated Patient   Methods Explanation   Comprehension Verbalized understanding            SLP Long Term Goals - 11/21/16 8299      SLP LONG TERM GOAL #1   Title Patient will identify cognitive barriers and participate in developing functional compensatory strategies.   Time 8   Period Weeks   Status New     SLP LONG TERM GOAL #2   Title Patient will demonstrate functional cognitive-communication  skills for independent completion of personal responsibilities.    Time 8   Period Weeks   Status New     SLP LONG TERM GOAL #3   Title Patient will complete complex visual-spatial activities with 80% accuracy.   Time 8   Period Weeks   Status New     SLP LONG TERM GOAL #4   Title Patient will complete attention, executive function skills, and memory strategy activities with 80% accuracy.   Time 8   Period Weeks   Status New          Plan - 12/10/16 0902    Clinical  Impression Statement The patient is demonstrating good sustained attention for tasks done today.  She demonstrates increased initiation of speech and a brighter affect.  She demonstrates improved processing speed.  She is able to read directions for tasks and follow through.  She responds to feedback and can improve performance when cued.   Speech Therapy Frequency 2x / week   Duration Other (comment)   Treatment/Interventions Compensatory techniques;Patient/family education;Compensatory strategies;SLP instruction and feedback;Functional tasks   Potential to Achieve Goals Good   Potential Considerations Ability to learn/carryover information;Cooperation/participation level;Family/community support   Consulted and Agree with Plan of Care Patient      Patient will benefit from skilled therapeutic intervention in order to improve the following deficits and impairments:   Cognitive communication deficit    Problem List Patient Active Problem List   Diagnosis Date Noted  . Primary adenocarcinoma of lower lobe of left lung ([place]) 09/04/2016   [name], MS/CCC- SLP  [name] 12/10/2016, 9:03 AM  [place] MAIN [facility] SERVICES [address] [place], [place], [address] Phone: [phone]   Fax:  [phone]   Name: AIDAN CALOCA MRN: [id number] Date of Birth: [birth year]

## 2016-12-11 ENCOUNTER — Ambulatory Visit: Payer: Medicare Other | Admitting: Occupational Therapy

## 2016-12-11 ENCOUNTER — Ambulatory Visit: Payer: Medicare Other

## 2016-12-11 ENCOUNTER — Ambulatory Visit: Payer: Medicare Other | Admitting: Speech Pathology

## 2016-12-11 DIAGNOSIS — R278 Other lack of coordination: Secondary | ICD-10-CM

## 2016-12-11 DIAGNOSIS — M6281 Muscle weakness (generalized): Secondary | ICD-10-CM

## 2016-12-11 DIAGNOSIS — R2689 Other abnormalities of gait and mobility: Secondary | ICD-10-CM | POA: Diagnosis not present

## 2016-12-11 DIAGNOSIS — R41841 Cognitive communication deficit: Secondary | ICD-10-CM | POA: Diagnosis not present

## 2016-12-11 NOTE — Therapy (Signed)
[place] MAIN [facility] SERVICES [address] [place], [place], [address] Phone: [phone]   Fax:  [phone]  Occupational Therapy Treatment  Patient Details  Name: Monica Collins MRN: [id number] Date of Birth: 1946/04/19 Referring Provider: Dr. Dina Rich  Encounter Date: 12/11/2016      OT End of Session - 12/11/16 1440    Visit Number 5   Number of Visits 24   Date for OT Re-Evaluation 02/12/17   Authorization Type Medicare G code 5 of 10   OT Start Time 5462   OT Stop Time 1515   OT Time Calculation (min) 44 min   Activity Tolerance Patient tolerated treatment well   Behavior During Therapy [facility] for tasks assessed/performed      Past Medical History:  Diagnosis Date  . Hypertension     Past Surgical History:  Procedure Laterality Date  . CHOLECYSTECTOMY      There were no vitals filed for this visit.      Subjective Assessment - 12/11/16 1436    Subjective  Pt. waked into the gym independently.   Patient is accompained by: Family member   Currently in Pain? Yes   Pain Score 3    Pain Location Back   Pain Orientation Lower      OT TREATMENT    Neuro muscular re-education:  Pt. performed [facility] skills training to improve speed and dexterity needed for ADL tasks and writing. Pt. demonstrated grasping 1 inch sticks,  inch cylindrical collars, and  inch flat washers on the Purdue pegboard. Pt. performed grasping each item with her 2nd digit and thumb, and storing them in the palm. Pt. presented with difficulty storing  inch objects at a time in the palmar aspect of the hand. Pt. requires visual cues, and verbal demonstration. Pt. Worked on bilateral alternating hand patterns.  Therapeutic Exercise:  2# dumbbell ex. for elbow flexion and extension, forearm supination/pronation, wrist flexion/extension, and radial deviation. Pt. requires rest breaks and verbal cues for proper technique. Pt. performed gross gripping with grip  strengthener. Pt. worked on sustaining grip while grasping pegs and reaching at various heights. Gripper was placed in the 1st resistive slot with the white resistive spring.Pt. performed resistive EZ Board exercises for forearm supination/pronation, wrist flexion/extension using gross grasp, and lateral pinch (key) grasp. Pt. performed resistive EZ Board exercises angled in several planes to promote shoulder flexion, abduction, and wrist flexion, and extension while performing resistive wrist flexion and extension with a gross grip.                               OT Education - 12/11/16 1440    Education provided Yes   Education Details  LUE Grip strength, and coordination skills.   Person(s) Educated Patient   Methods Explanation   Comprehension Verbalized understanding          OT Short Term Goals - 11/20/16 1720      OT SHORT TERM GOAL #1   Title --   Baseline --   Time --   Period --   Status --   Target Date --     OT SHORT TERM GOAL #2   Title --   Baseline --   Time --   Period --   Status --   Target Date --     OT SHORT TERM GOAL #3   Title --   Baseline --   Time --  Period --   Status --   Target Date --     OT SHORT TERM GOAL #4   Title --   Baseline --   Time --   Period --   Status --   Target Date --     OT SHORT TERM GOAL #5   Title --   Baseline --   Time --   Period --   Status --   Target Date --     OT SHORT TERM GOAL #6   Title --   Baseline --   Time --   Period --   Status --   Target Date --     OT SHORT TERM GOAL #7   Title --   Baseline --   Time --   Period --   Status --   Target Date --           OT Long Term Goals - 11/20/16 1742      OT LONG TERM GOAL #1   Title Pt. will improve left hand [facility] skills by 5 sec. to assist with grooming.   Baseline R: 25, L: 47   Time 2   Period Weeks   Status New   Target Date 02/12/17     OT LONG TERM GOAL #2   Title Pt. will improve left grip  strength by 5# to be able to open jars, bolttles   Baseline R: 32, L: 18   Time 12   Period Weeks   Status New   Target Date 02/12/17     OT LONG TERM GOAL #3   Title Pt. will improve left 3pt. pinch strength by 3# to be able to hold, and use a toothbrush.   Baseline R: 12#, L: 8#   Time 12   Period Weeks   Status New   Target Date 02/12/17     OT LONG TERM GOAL #4   Title Pt. will be independent with  UE HEP program   Baseline Assist required   Time 12   Period Weeks   Status New   Target Date 02/12/17     OT LONG TERM GOAL #5   Title Pt. will independently follow a simple cold recipe.   Baseline Pt. is unable   Time 12   Period Weeks   Status New   Target Date 02/12/17     OT LONG TERM GOAL #6   Title Pt. will scan to the left 100% of the time with minimal cues to be able to navigate her environment during IADL tasks using visual compensatory strategies.   Baseline Pt. has difficulty   Time 12   Period Weeks   Status New   Target Date 02/12/17     OT LONG TERM GOAL #7   Title Pt. will scan to the left 100% of the time with minimal cues during tabletop ADL tasks, using visual compensatory strategies.   Baseline Pt. has difficulty   Time 12   Period Weeks   Status New   Target Date 02/12/17               Plan - 12/11/16 1444    Clinical Impression Statement Pt. reports having an appointment with the oncologist tomorrow. Pt. reports doing more tasks independently at home. Pt.'s daughter has returned to [place]. Pt. continues to work on improving LUE strength, and coordination skills. Pt. requires verbal cues, and vsiual demonstration for left hand tasks.   Occupational performance deficits (Please  refer to evaluation for details): ADL's;IADL's   Rehab Potential Good   Current Impairments/barriers affecting progress: Positive Indicators: familiy support, Age. Negative Indicators: multiple comorbidities.   OT Frequency 2x / week   OT Duration 12 weeks    OT Treatment/Interventions Self-care/ADL training;Therapeutic exercise;Energy conservation;DME and/or AE instruction;Therapeutic activities;Therapeutic exercises;Manual Therapy;Neuromuscular education;Patient/family education;Visual/perceptual remediation/compensation;Cognitive remediation/compensation   Consulted and Agree with Plan of Care Family member/caregiver      Patient will benefit from skilled therapeutic intervention in order to improve the following deficits and impairments:  Pain, Impaired UE functional use, Decreased strength, Decreased coordination, Decreased cognition, Decreased range of motion, Decreased activity tolerance, Impaired vision/preception, Decreased safety awareness, Decreased balance, Decreased knowledge of precautions  Visit Diagnosis: Muscle weakness (generalized)  Other lack of coordination    Problem List Patient Active Problem List   Diagnosis Date Noted  . Primary adenocarcinoma of lower lobe of left lung ([place]) 09/04/2016    Harrel Carina, MS, OTR/L 12/11/2016, 3:08 PM  [place] MAIN [facility] SERVICES [address] [place], [place], [address] Phone: [phone]   Fax:  [phone]  Name: ERAINA WINNIE MRN: [id number] Date of Birth: 1945-10-25

## 2016-12-11 NOTE — Therapy (Signed)
[place] MAIN [facility] SERVICES [address] [place], [place], [address] Phone: [phone]   Fax:  [phone]  Physical Therapy Treatment  Patient Details  Name: Monica Collins MRN: [id number] Date of Birth: Nov 12, 1945 Referring Provider: Leretha Dykes, DO  Encounter Date: 12/11/2016      PT End of Session - 12/12/16 0759    Visit Number 5   Number of Visits 10   Date for PT Re-Evaluation 12/30/16   Authorization Type recheck goals 12/26/16.   Authorization - Visit Number 5   Authorization - Number of Visits 10   PT Start Time 6962   PT Stop Time 1559   PT Time Calculation (min) 44 min   Equipment Utilized During Treatment Gait belt   Activity Tolerance Patient tolerated treatment well   Behavior During Therapy WFL for tasks assessed/performed      Past Medical History:  Diagnosis Date  . Hypertension     Past Surgical History:  Procedure Laterality Date  . CHOLECYSTECTOMY      There were no vitals filed for this visit.      Subjective Assessment - 12/11/16 1545    Subjective Patient reports no stumbles or falls since last week. 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 Left lower lobe resection on 10-17-16. She was in the hospital for 30 days. 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    Limitations Lifting;Walking;House hold activities   How long can you sit comfortably? N/A   How long can you stand comfortably? 15 minutes   How long can you walk comfortably? 20 minutes   Patient Stated Goals do stuff around the house   Currently in Pain? No/denies       Gait Walk outside, over grass, over uneven surfaces and cobblestone, up and down inclines, Cues for lifting feet Curb education and practice, up 10x, down 10x each  leg, cues for softer steps, awareness of setting, task orientation  TherEx 1lb ankle weights  Standing marches 12x, cues to maintain neutral alignment of LLE to prevent internal rotation   Standing extensions x12 each leg  Standing abduction x12 each leg  Hamstring curls x12  Heel raises 12x Squat pick up 4 cones consecutively step over orange cone Bounce ball down hall Toss ball up and down down hall, CGA     Pt. response to medical necessity: . Patient will continue to benefit from skilled physical therapy to improve functional ambulation, balance, and LE strength for return to previous level of function   Weak LE's limit pt.'s capacity to ambulate distances requiring frequent rest breaks.                         PT Education - 12/11/16 1552    Education provided Yes   Education Details curb negotiation    Person(s) Educated Patient   Methods Explanation;Demonstration;Verbal cues   Comprehension Verbalized understanding;Returned demonstration             PT Long Term Goals - 11/25/16 1736      PT LONG TERM GOAL #1   Title Patient will increase Berg Balance score by > 6 points (55/56) to demonstrate decreased fall risk during functional activities.   Baseline 7/30: 49/56  Time 6   Period Weeks   Status New   Target Date 12/23/16     PT LONG TERM GOAL #2   Title Patient will increase 10 meter walk test to >1.58m/s as to improve gait speed for better community ambulation and to reduce fall risk.   Baseline 7/30: .71   Time 6   Period Weeks   Status New   Target Date 12/23/16     PT LONG TERM GOAL #3   Title Patient will increase ABC scale score >80% to demonstrate better functional mobility and better confidence with ADLs.    Baseline 7/30: 40%   Time 6   Period Weeks   Status New   Target Date 12/23/16     PT LONG TERM GOAL #4   Title Patient will increase lower extremity functional scale to >50/80 to demonstrate improved functional  mobility and increased tolerance with ADLs.    Baseline 7/30: 24/80   Time 6   Period Weeks   Status New   Target Date 12/23/16     PT LONG TERM GOAL #5   Title Patient (> 73 years old) will complete five times sit to stand test in < 15 seconds indicating an increased LE strength and improved balance.   Baseline 7/30 18 sec   Time 6   Period Weeks   Status New   Target Date 12/23/16               Plan - 12/12/16 0803    Clinical Impression Statement Patient demonstrated ability to safely negotiate obstacles and uneven terrain outside. She required occasional cueing for lifting feet and orientation to changing surfaces with quick compliance. Weak LE's limit pt.'s capacity to ambulate distances requiring frequent rest breaks. Patient will continue to benefit from skilled physical therapy to improve functional ambulation, balance, and LE strength for return to previous level of function   Rehab Potential Fair   Clinical Impairments Affecting Rehab Potential This patient presents with 3, personal factors/ comorbidities and 3 ,   body elements including body structures and functions, activity limitations and or participation restrictions. Patient's condition is evolving.   PT Frequency 2x / week   PT Duration 6 weeks   PT Treatment/Interventions ADLs/Self Care Home Management;Biofeedback;Cryotherapy;Scientist, product/process development;Iontophoresis 4mg /ml Dexamethasone;Moist Heat;Ultrasound;Traction;Gait training;Stair training;Functional mobility training;Therapeutic activities;Therapeutic exercise;Balance training;Patient/family education;Cognitive remediation;Neuromuscular re-education;Manual techniques;Compression bandaging;Passive range of motion;Energy conservation;Taping;Visual/perceptual remediation/compensation   PT Next Visit Plan stairs, walking, balance, strength   PT Home Exercise Plan see sheet   Consulted and Agree with Plan of Care Patient;Family member/caregiver       Patient will benefit from skilled therapeutic intervention in order to improve the following deficits and impairments:  Abnormal gait, Decreased activity tolerance, Decreased balance, Decreased knowledge of precautions, Decreased endurance, Decreased coordination, Decreased cognition, Decreased knowledge of use of DME, Decreased mobility, Decreased range of motion, Decreased safety awareness, Difficulty walking, Decreased strength, Impaired flexibility, Impaired perceived functional ability, Impaired vision/preception, Postural dysfunction, Improper body mechanics, Pain  Visit Diagnosis: Muscle weakness (generalized)  Other lack of coordination     Problem List Patient Active Problem List   Diagnosis Date Noted  . 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

## 2016-12-12 ENCOUNTER — Encounter: Payer: Self-pay | Admitting: Speech Pathology

## 2016-12-12 DIAGNOSIS — C3492 Malignant neoplasm of unspecified part of left bronchus or lung: Secondary | ICD-10-CM | POA: Diagnosis not present

## 2016-12-12 NOTE — Therapy (Signed)
[place] MAIN [facility] SERVICES [address] [place], [place], [address] Phone: [phone]   Fax:  [phone]  Speech Language Pathology Treatment  Patient Details  Name: NISHKA HEIDE MRN: [id number] Date of Birth: 1945/07/15 Referring Provider: Dr. Dina Rich  Encounter Date: 12/11/2016      End of Session - 12/12/16 1014    Visit Number 6   Number of Visits 17   Date for SLP Re-Evaluation 01/21/17   SLP Start Time 1600   SLP Stop Time  1655   SLP Time Calculation (min) 55 min   Activity Tolerance Patient tolerated treatment well      Past Medical History:  Diagnosis Date  . Hypertension     Past Surgical History:  Procedure Laterality Date  . CHOLECYSTECTOMY      There were no vitals filed for this visit.      Subjective Assessment - 12/12/16 1014    Subjective Patient has brighter affect   Currently in Pain? No/denies               ADULT SLP TREATMENT - 12/12/16 0001      General Information   Behavior/Cognition Alert;Cooperative;Pleasant mood   HPI Patient is a 71 year old woman who suffered a right MCA aneurysm; right Craniectomy and Clip Ligation of aneurysm performed on 10/23/16. Patient's neurological state deteriorated on 10/29/16, and head CT showed edema around aneurysm clip site. She spontaneously improved to her baseline that same day. On 10/31/16, patient's family noticed patient was having seizure-like activity with pill-rolling hand movements. Patient was monitored on an EEG and no electrographic seizures were noted over 48 hours of continuous monitoring. She was evaluated by SLP for swallowing and is on a regular diet with thin liquids as of 11/04/16. SLP noted that patient has low vocal intensity. Patient was [facility] for oral mech exam, orientation, and memory evaluations but demonstrated deficits in problem solving, safety/judgement, visual perception (left neglect), comprehension, and pragmatics/prosody. SLP  recommended intensive speech therapy. 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 Patient's prior level of functioning was [facility] before the aneurysm.       Treatment Provided   Treatment provided Cognitive-Linquistic     Pain Assessment   Pain Assessment No/denies pain     Cognitive-Linquistic Treatment   Treatment focused on Cognition;Patient/family/caregiver education   Skilled Treatment VISUAL PROCESSING: Complete moderately complex visual processing activities with overall 80% accuracy.  LANGUAGE/COGNITIVE SKILLS: Read and solve math word problems with 80% accuracy.        Assessment / Recommendations / Plan   Plan Continue with current plan of care     Progression Toward Goals   Progression toward goals Progressing toward goals          SLP Education - 12/12/16 1014    Education provided Yes   Education Details identify problems and develop approaches   Person(s) Educated Patient   Methods Explanation   Comprehension Verbalized understanding            SLP Long Term Goals - 11/21/16 6967      SLP LONG TERM GOAL #1   Title Patient will identify cognitive barriers and participate in developing functional compensatory strategies.   Time 8   Period Weeks   Status New     SLP LONG TERM GOAL #2   Title Patient will demonstrate functional cognitive-communication skills for independent completion of personal responsibilities.    Time  8   Period Weeks   Status New     SLP LONG TERM GOAL #3   Title Patient will complete complex visual-spatial activities with 80% accuracy.   Time 8   Period Weeks   Status New     SLP LONG TERM GOAL #4   Title Patient will complete attention, executive function skills, and memory strategy activities with 80% accuracy.   Time 8   Period Weeks   Status New          Plan - 12/12/16 1015    Clinical Impression Statement The patient is demonstrating good sustained attention  for tasks done today.  She demonstrates increased initiation of speech and a brighter affect.  She demonstrates improved processing speed.  She is able to read directions for tasks and follow through.  She responds to feedback and can improve performance when cued.   Speech Therapy Frequency 2x / week   Duration Other (comment)   Treatment/Interventions Compensatory techniques;Patient/family education;Compensatory strategies;SLP instruction and feedback;Functional tasks   Potential to Achieve Goals Good   Potential Considerations Ability to learn/carryover information;Cooperation/participation level;Family/community support   Consulted and Agree with Plan of Care Patient      Patient will benefit from skilled therapeutic intervention in order to improve the following deficits and impairments:   Cognitive communication deficit    Problem List Patient Active Problem List   Diagnosis Date Noted  . 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

## 2016-12-16 ENCOUNTER — Ambulatory Visit: Payer: Medicare Other | Admitting: Speech Pathology

## 2016-12-16 ENCOUNTER — Ambulatory Visit: Payer: Medicare Other

## 2016-12-16 ENCOUNTER — Ambulatory Visit: Payer: Medicare Other | Admitting: Occupational Therapy

## 2016-12-16 ENCOUNTER — Encounter: Payer: Self-pay | Admitting: Occupational Therapy

## 2016-12-16 DIAGNOSIS — M6281 Muscle weakness (generalized): Secondary | ICD-10-CM

## 2016-12-16 DIAGNOSIS — R278 Other lack of coordination: Secondary | ICD-10-CM

## 2016-12-16 DIAGNOSIS — R2689 Other abnormalities of gait and mobility: Secondary | ICD-10-CM | POA: Diagnosis not present

## 2016-12-16 DIAGNOSIS — R41841 Cognitive communication deficit: Secondary | ICD-10-CM | POA: Diagnosis not present

## 2016-12-16 NOTE — Therapy (Signed)
[place] MAIN [facility] SERVICES [address] [place], [place], [address] Phone: [phone]   Fax:  [phone]  Physical Therapy Treatment  Patient Details  Name: Monica Collins MRN: [id number] Date of Birth: October 15, 1945 Referring Provider: Leretha Dykes, DO  Encounter Date: 12/16/2016      PT End of Session - 12/16/16 1537    Visit Number 6   Number of Visits 10   Date for PT Re-Evaluation 12/30/16   Authorization Type recheck goals 12/26/16.   Authorization - Visit Number 6   Authorization - Number of Visits 10   PT Start Time 4034   PT Stop Time 1600   PT Time Calculation (min) 43 min   Equipment Utilized During Treatment Gait belt   Activity Tolerance Patient tolerated treatment well   Behavior During Therapy WFL for tasks assessed/performed      Past Medical History:  Diagnosis Date  . Hypertension     Past Surgical History:  Procedure Laterality Date  . CHOLECYSTECTOMY      There were no vitals filed for this visit.      Subjective Assessment - 12/16/16 1520    Subjective Patient reports no LOB or stumbles since last visit.    Patient is accompained by: Family member   Pertinent History Patient is a 71 year old female who was admittedto 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 Left lower lobe resection on 10-17-16. She was in the hospital for 30 days. 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    Limitations Lifting;Walking;House hold activities   How long can you sit comfortably? N/A   How long can you stand comfortably? 15 minutes   How long can you walk comfortably? 20 minutes   Patient Stated Goals do stuff around the house   Currently in Pain? No/denies       Neuro Re-ed: Ascend/descend flight of stairs in hospital stairway: utilized 0 handrails or UE support, reciprocal pattern up and down, one episode of LOB descending    TherEx 1lb ankle weights              Standing marches 20x, cues to maintain neutral alignment of LLE to prevent internal rotation              Standing extensions x15 each leg             Standing abduction x15 each leg             Hamstring curls x15             Heel raises 15x Side step in // bars with knees bent and YTB around ankles 6x length of bars  [place] kick back step through 4x length of // bars Side step up and down 6" step 10x each leg    Pt. response to medical necessity: . Patient will continue to benefit from skilled physical therapy to improve functional ambulation, balance, and LE strength for return to previous level of function.                      PT Education - 12/16/16 1750    Education provided Yes   Education Details stair negotiation   Person(s) Educated Patient   Methods Explanation;Demonstration;Verbal cues   Comprehension Verbalized understanding;Returned demonstration             PT Long Term Goals - 11/25/16 1736  PT LONG TERM GOAL #1   Title Patient will increase Berg Balance score by > 6 points (55/56) to demonstrate decreased fall risk during functional activities.   Baseline 7/30: 49/56   Time 6   Period Weeks   Status New   Target Date 12/23/16     PT LONG TERM GOAL #2   Title Patient will increase 10 meter walk test to >1.74m/s as to improve gait speed for better community ambulation and to reduce fall risk.   Baseline 7/30: .71   Time 6   Period Weeks   Status New   Target Date 12/23/16     PT LONG TERM GOAL #3   Title Patient will increase ABC scale score >80% to demonstrate better functional mobility and better confidence with ADLs.    Baseline 7/30: 40%   Time 6   Period Weeks   Status New   Target Date 12/23/16     PT LONG TERM GOAL #4   Title Patient will increase lower extremity functional scale to >50/80 to demonstrate improved functional mobility and increased tolerance with ADLs.    Baseline 7/30: 24/80   Time 6   Period Weeks    Status New   Target Date 12/23/16     PT LONG TERM GOAL #5   Title Patient (> 44 years old) will complete five times sit to stand test in < 15 seconds indicating an increased LE strength and improved balance.   Baseline 7/30 18 sec   Time 6   Period Weeks   Status New   Target Date 12/23/16               Plan - 12/16/16 1751    Clinical Impression Statement Patient demonstrated ability to negotiate stairwell with reciprocal pattern . Descending required CGA or single UE assistance due to preference to posteriorly weight shift. Patient's limited LE's strength is improving between sets with patient increasing set range. Patient will continue to benefit from skilled physical therapy to improve functional ambulation, balance, and LE strength for return to previous level of function.    Rehab Potential Fair   Clinical Impairments Affecting Rehab Potential This patient presents with 3, personal factors/ comorbidities and 3 ,   body elements including body structures and functions, activity limitations and or participation restrictions. Patient's condition is evolving.   PT Frequency 2x / week   PT Duration 6 weeks   PT Treatment/Interventions ADLs/Self Care Home Management;Biofeedback;Cryotherapy;Scientist, product/process development;Iontophoresis 4mg /ml Dexamethasone;Moist Heat;Ultrasound;Traction;Gait training;Stair training;Functional mobility training;Therapeutic activities;Therapeutic exercise;Balance training;Patient/family education;Cognitive remediation;Neuromuscular re-education;Manual techniques;Compression bandaging;Passive range of motion;Energy conservation;Taping;Visual/perceptual remediation/compensation   PT Next Visit Plan stairs, walking, balance, strength   PT Home Exercise Plan see sheet   Consulted and Agree with Plan of Care Patient;Family member/caregiver      Patient will benefit from skilled therapeutic intervention in order to improve the following deficits  and impairments:  Abnormal gait, Decreased activity tolerance, Decreased balance, Decreased knowledge of precautions, Decreased endurance, Decreased coordination, Decreased cognition, Decreased knowledge of use of DME, Decreased mobility, Decreased range of motion, Decreased safety awareness, Difficulty walking, Decreased strength, Impaired flexibility, Impaired perceived functional ability, Impaired vision/preception, Postural dysfunction, Improper body mechanics, Pain  Visit Diagnosis: Muscle weakness (generalized)  Other lack of coordination  Other abnormalities of gait and mobility     Problem List Patient Active Problem List   Diagnosis Date Noted  . 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

## 2016-12-17 ENCOUNTER — Encounter: Payer: Self-pay | Admitting: Speech Pathology

## 2016-12-17 DIAGNOSIS — I10 Essential (primary) hypertension: Secondary | ICD-10-CM | POA: Diagnosis not present

## 2016-12-17 DIAGNOSIS — F411 Generalized anxiety disorder: Secondary | ICD-10-CM | POA: Diagnosis not present

## 2016-12-17 DIAGNOSIS — F17210 Nicotine dependence, cigarettes, uncomplicated: Secondary | ICD-10-CM | POA: Diagnosis not present

## 2016-12-17 DIAGNOSIS — C3432 Malignant neoplasm of lower lobe, left bronchus or lung: Secondary | ICD-10-CM | POA: Diagnosis not present

## 2016-12-17 DIAGNOSIS — I69354 Hemiplegia and hemiparesis following cerebral infarction affecting left non-dominant side: Secondary | ICD-10-CM | POA: Diagnosis not present

## 2016-12-17 NOTE — Therapy (Signed)
[place] MAIN [facility] SERVICES [address] [place], [place], [address] Phone: [phone]   Fax:  [phone]  Speech Language Pathology Treatment  Patient Details  Name: FRANNIE SHEDRICK MRN: [id number] Date of Birth: Dec 23, 1945 Referring Provider: Dr. Dina Rich  Encounter Date: 12/16/2016      End of Session - 12/17/16 1136    Visit Number 7   Number of Visits 17   Date for SLP Re-Evaluation 01/21/17   SLP Start Time 9   SLP Stop Time  1657   SLP Time Calculation (min) 57 min   Activity Tolerance Patient tolerated treatment well      Past Medical History:  Diagnosis Date  . Hypertension     Past Surgical History:  Procedure Laterality Date  . CHOLECYSTECTOMY      There were no vitals filed for this visit.      Subjective Assessment - 12/17/16 1133    Subjective "I feel more like myself"   Currently in Pain? No/denies               ADULT SLP TREATMENT - 12/17/16 0001      General Information   Behavior/Cognition Alert;Cooperative;Pleasant mood   HPI Patient is a 71 year old woman who suffered a right MCA aneurysm; right Craniectomy and Clip Ligation of aneurysm performed on 10/23/16. Patient's neurological state deteriorated on 10/29/16, and head CT showed edema around aneurysm clip site. She spontaneously improved to her baseline that same day. On 10/31/16, patient's family noticed patient was having seizure-like activity with pill-rolling hand movements. Patient was monitored on an EEG and no electrographic seizures were noted over 48 hours of continuous monitoring. She was evaluated by SLP for swallowing and is on a regular diet with thin liquids as of 11/04/16. SLP noted that patient has low vocal intensity. Patient was [facility] for oral mech exam, orientation, and memory evaluations but demonstrated deficits in problem solving, safety/judgement, visual perception (left neglect), comprehension, and pragmatics/prosody. SLP  recommended intensive speech therapy. 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 Patient's prior level of functioning was [facility] before the aneurysm.       Treatment Provided   Treatment provided Cognitive-Linquistic     Pain Assessment   Pain Assessment No/denies pain     Cognitive-Linquistic Treatment   Treatment focused on Cognition;Patient/family/caregiver education   Skilled Treatment VISUAL PROCESSING: Complete moderately complex visual processing activities with overall 80% accuracy.  LANGUAGE/COGNITIVE SKILLS: Read and solve Perplexors Level A with mod SLP cues.        Assessment / Recommendations / Plan   Plan Continue with current plan of care     Progression Toward Goals   Progression toward goals Progressing toward goals          SLP Education - 12/17/16 1136    Education provided Yes   Education Details strategies to complete complex attention/visual processing tasks   Person(s) Educated Patient   Methods Explanation   Comprehension Verbalized understanding            SLP Long Term Goals - 11/21/16 0835      SLP LONG TERM GOAL #1   Title Patient will identify cognitive barriers and participate in developing functional compensatory strategies.   Time 8   Period Weeks   Status New     SLP LONG TERM GOAL #2   Title Patient will demonstrate functional cognitive-communication skills for independent completion of personal responsibilities.  Time 8   Period Weeks   Status New     SLP LONG TERM GOAL #3   Title Patient will complete complex visual-spatial activities with 80% accuracy.   Time 8   Period Weeks   Status New     SLP LONG TERM GOAL #4   Title Patient will complete attention, executive function skills, and memory strategy activities with 80% accuracy.   Time 8   Period Weeks   Status New          Plan - 12/17/16 1137    Clinical Impression Statement The patient is demonstrating  improving attention and visual processing skills.  She demonstrates increased initiation of speech and a brighter affect.  She demonstrates improved processing speed.  She is able to read directions for tasks and follow through.  She responds to feedback and can improve performance when cued.   Speech Therapy Frequency 2x / week   Duration Other (comment)   Treatment/Interventions Compensatory techniques;Patient/family education;Compensatory strategies;SLP instruction and feedback;Functional tasks   Potential to Achieve Goals Good   Potential Considerations Ability to learn/carryover information;Cooperation/participation level;Family/community support   SLP Home Exercise Plan Perplexor, letter scanning   Consulted and Agree with Plan of Care Patient      Patient will benefit from skilled therapeutic intervention in order to improve the following deficits and impairments:   Cognitive communication deficit    Problem List Patient Active Problem List   Diagnosis Date Noted  . 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

## 2016-12-18 ENCOUNTER — Ambulatory Visit: Payer: Medicare Other | Admitting: Occupational Therapy

## 2016-12-18 ENCOUNTER — Ambulatory Visit: Payer: Medicare Other

## 2016-12-18 ENCOUNTER — Ambulatory Visit: Payer: Medicare Other | Admitting: Speech Pathology

## 2016-12-18 DIAGNOSIS — R278 Other lack of coordination: Secondary | ICD-10-CM

## 2016-12-18 DIAGNOSIS — R41841 Cognitive communication deficit: Secondary | ICD-10-CM | POA: Diagnosis not present

## 2016-12-18 DIAGNOSIS — R2689 Other abnormalities of gait and mobility: Secondary | ICD-10-CM | POA: Diagnosis not present

## 2016-12-18 DIAGNOSIS — M6281 Muscle weakness (generalized): Secondary | ICD-10-CM | POA: Diagnosis not present

## 2016-12-18 NOTE — Therapy (Signed)
[place] MAIN [facility] SERVICES [address] [place], [place], [address] Phone: [phone]   Fax:  [phone]  Physical Therapy Treatment  Patient Details  Name: Monica Collins MRN: [id number] Date of Birth: 11/10/1945 Referring Provider: Leretha Dykes, DO  Encounter Date: 12/18/2016      PT End of Session - 12/18/16 1604    Visit Number 7   Number of Visits 10   Date for PT Re-Evaluation 12/30/16   Authorization Type recheck goals 12/26/16.   Authorization - Visit Number 7   Authorization - Number of Visits 10   PT Start Time 5498   PT Stop Time 1600   PT Time Calculation (min) 44 min   Equipment Utilized During Treatment Gait belt   Activity Tolerance Patient tolerated treatment well   Behavior During Therapy WFL for tasks assessed/performed      Past Medical History:  Diagnosis Date  . Hypertension     Past Surgical History:  Procedure Laterality Date  . CHOLECYSTECTOMY      There were no vitals filed for this visit.      Subjective Assessment - 12/18/16 1603    Subjective Patient went to doctor since last visit. 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 Left lower lobe resection on 10-17-16. She was in the hospital for 30 days. 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    Limitations Lifting;Walking;House hold activities   How long can you sit comfortably? N/A   How long can you stand comfortably? 15 minutes   How long can you walk comfortably? 20 minutes   Patient Stated Goals do stuff around the house   Currently in Pain? No/denies     BERG: 54/56 10MWT: 8 seconds ABC LEFS: 62/80 5xSTS: 14 seconds   Neuro Re-ed: Ascend/descend flight of stairs in hospital stairway: utilized 0 handrails or UE  support, reciprocal pattern up and down, two episodes of LOB descending due to fatigue Tandem stance airex pad   TherEx: Ambulating outside over uneven surfaces and over obstacles, occasional cues for lifting feet, no LOB or stumbles. Improved arm swing throughout duration of ambulation, slightly declined with fatigue at end.      [facility] PT Assessment - 12/18/16 0001      Berg Balance Test   Sit to Stand Able to stand without using hands and stabilize independently   Standing Unsupported Able to stand safely 2 minutes   Sitting with Back Unsupported but Feet Supported on Floor or Stool Able to sit safely and securely 2 minutes   Stand to Sit Sits safely with minimal use of hands   Transfers Able to transfer safely, minor use of hands   Standing Unsupported with Eyes Closed Able to stand 10 seconds safely   Standing Ubsupported with Feet Together Able to place feet together independently and stand 1 minute safely   From Standing, Reach Forward with Outstretched Arm Can reach confidently >25 cm (10")   From Standing Position, Pick up Object from Floor Able to pick up shoe safely and easily   From Standing Position, Turn to Look Behind Over each Shoulder Looks behind from both sides and weight shifts well   Turn 360 Degrees Able to turn 360 degrees safely in 4 seconds  or less   Standing Unsupported, Alternately Place Feet on Step/Stool Able to stand independently and complete 8 steps >20 seconds   Standing Unsupported, One Foot in Front Able to place foot tandem independently and hold 30 seconds   Standing on One Leg Able to lift leg independently and hold 5-10 seconds   Total Score 54      Pt. response to medical necessity: . Patient will continue to benefit from skilled physical therapy to improve functional ambulation, balance, and LE strength for return to previous level of function .                        PT Education - 12/18/16 1604    Education provided Yes    Education Details ambulatory mechanics, progression towards end goals   Person(s) Educated Patient   Methods Explanation;Demonstration;Verbal cues   Comprehension Verbalized understanding;Returned demonstration             PT Long Term Goals - 12/18/16 1530      PT LONG TERM GOAL #1   Title Patient will increase Berg Balance score by > 6 points (55/56) to demonstrate decreased fall risk during functional activities.   Baseline 7/30: 49/56 8/22: 54/56   Time 6   Period Weeks   Status Partially Met     PT LONG TERM GOAL #2   Title Patient will increase 10 meter walk test to >1.88ms as to improve gait speed for better community ambulation and to reduce fall risk.   Baseline 7/30: .71 8/22: 1.25 m/s   Time 6   Period Weeks   Status Achieved     PT LONG TERM GOAL #3   Title Patient will increase ABC scale score >80% to demonstrate better functional mobility and better confidence with ADLs.    Baseline 7/30: 40% 8/22: 83.13%   Time 6   Period Weeks   Status Achieved     PT LONG TERM GOAL #4   Title Patient will increase lower extremity functional scale to >50/80 to demonstrate improved functional mobility and increased tolerance with ADLs.    Baseline 7/30: 24/80 8/22: 62/80   Time 6   Period Weeks   Status Achieved     PT LONG TERM GOAL #5   Title Patient (> 641years old) will complete five times sit to stand test in < 15 seconds indicating an increased LE strength and improved balance.   Baseline 7/30 18 sec 8/22: 14 seconds   Time 6   Period Weeks   Status Achieved               Plan - 12/18/16 1604    Clinical Impression Statement Patient progressing towards all goals at this time. Single limb stance continues to challenge patient, both statically and dynamically. Stair negotiation was more challenging today's session due to patient fatigue. Will prepare patient next session for progression towards more independent program. Patient will continue to benefit  from skilled physical therapy to improve functional ambulation, balance, and LE strength for return to previous level of function .   Rehab Potential Fair   Clinical Impairments Affecting Rehab Potential This patient presents with 3, personal factors/ comorbidities and 3 ,   body elements including body structures and functions, activity limitations and or participation restrictions. Patient's condition is evolving.   PT Frequency 2x / week   PT Duration 6 weeks   PT Treatment/Interventions ADLs/Self Care Home Management;Biofeedback;Cryotherapy;EScientist, product/process developmentIontophoresis 445mml Dexamethasone;Moist Heat;Ultrasound;Traction;Gait training;Stair  training;Functional mobility training;Therapeutic activities;Therapeutic exercise;Balance training;Patient/family education;Cognitive remediation;Neuromuscular re-education;Manual techniques;Compression bandaging;Passive range of motion;Energy conservation;Taping;Visual/perceptual remediation/compensation   PT Next Visit Plan new HEP, d/c?   PT Home Exercise Plan see sheet   Consulted and Agree with Plan of Care Patient;Family member/caregiver      Patient will benefit from skilled therapeutic intervention in order to improve the following deficits and impairments:  Abnormal gait, Decreased activity tolerance, Decreased balance, Decreased knowledge of precautions, Decreased endurance, Decreased coordination, Decreased cognition, Decreased knowledge of use of DME, Decreased mobility, Decreased range of motion, Decreased safety awareness, Difficulty walking, Decreased strength, Impaired flexibility, Impaired perceived functional ability, Impaired vision/preception, Postural dysfunction, Improper body mechanics, Pain  Visit Diagnosis: Muscle weakness (generalized)  Other lack of coordination  Other abnormalities of gait and mobility     Problem List Patient Active Problem List   Diagnosis Date Noted  . 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 [place], [place], [address] Phone: [phone]   Fax:  [phone]  Name: Monica Collins MRN: [id number] Date of Birth: [birth year]

## 2016-12-19 ENCOUNTER — Encounter: Payer: Self-pay | Admitting: Speech Pathology

## 2016-12-19 NOTE — Therapy (Signed)
[place] MAIN [facility] SERVICES [address] [place], [place], [address] Phone: [phone]   Fax:  [phone]  Speech Language Pathology Treatment  Patient Details  Name: Monica Collins MRN: [id number] Date of Birth: 16-May-1945 Referring Provider: Dr. Dina Rich  Encounter Date: 12/18/2016      End of Session - 12/19/16 0843    Visit Number 8   Number of Visits 17   Date for SLP Re-Evaluation 01/21/17   SLP Start Time 1600   SLP Stop Time  3154   SLP Time Calculation (min) 53 min   Activity Tolerance Patient tolerated treatment well      Past Medical History:  Diagnosis Date  . Hypertension     Past Surgical History:  Procedure Laterality Date  . CHOLECYSTECTOMY      There were no vitals filed for this visit.      Subjective Assessment - 12/19/16 0842    Subjective "I feel more like myself"   Currently in Pain? No/denies               ADULT SLP TREATMENT - 12/19/16 0001      General Information   Behavior/Cognition Alert;Cooperative;Pleasant mood   HPI Patient is a 71 year old woman who suffered a right MCA aneurysm; right Craniectomy and Clip Ligation of aneurysm performed on 10/23/16. Patient's neurological state deteriorated on 10/29/16, and head CT showed edema around aneurysm clip site. She spontaneously improved to her baseline that same day. On 10/31/16, patient's family noticed patient was having seizure-like activity with pill-rolling hand movements. Patient was monitored on an EEG and no electrographic seizures were noted over 48 hours of continuous monitoring. She was evaluated by SLP for swallowing and is on a regular diet with thin liquids as of 11/04/16. SLP noted that patient has low vocal intensity. Patient was [facility] for oral mech exam, orientation, and memory evaluations but demonstrated deficits in problem solving, safety/judgement, visual perception (left neglect), comprehension, and pragmatics/prosody. SLP  recommended intensive speech therapy. 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 Patient's prior level of functioning was [facility] before the aneurysm.       Treatment Provided   Treatment provided Cognitive-Linquistic     Pain Assessment   Pain Assessment No/denies pain     Cognitive-Linquistic Treatment   Treatment focused on Cognition;Patient/family/caregiver education   Skilled Treatment VISUAL PROCESSING: Complete moderately complex visual processing activities with overall 80% accuracy.  LANGUAGE/COGNITIVE SKILLS: Read and solve Perplexors Level A with min-mod SLP cues.        Assessment / Recommendations / Plan   Plan Continue with current plan of care     Progression Toward Goals   Progression toward goals Progressing toward goals          SLP Education - 12/19/16 [phone]    Education provided Yes   Education Details plan how to accomplish a task   Person(s) Educated Patient   Methods Explanation   Comprehension Verbalized understanding            SLP Long Term Goals - 11/21/16 7619      SLP LONG TERM GOAL #1   Title Patient will identify cognitive barriers and participate in developing functional compensatory strategies.   Time 8   Period Weeks   Status New     SLP LONG TERM GOAL #2   Title Patient will demonstrate functional cognitive-communication skills for independent completion of personal responsibilities.  Time 8   Period Weeks   Status New     SLP LONG TERM GOAL #3   Title Patient will complete complex visual-spatial activities with 80% accuracy.   Time 8   Period Weeks   Status New     SLP LONG TERM GOAL #4   Title Patient will complete attention, executive function skills, and memory strategy activities with 80% accuracy.   Time 8   Period Weeks   Status New          Plan - 12/19/16 9574    Clinical Impression Statement The patient is demonstrating improving attention and visual  processing skills.  She demonstrates increased initiation of speech and a brighter affect.  She demonstrates improved processing speed.  She is able to read directions for tasks and follow through.  She responds to feedback and can improve performance when cued.   Speech Therapy Frequency 2x / week   Duration Other (comment)   Treatment/Interventions Compensatory techniques;Patient/family education;Compensatory strategies;SLP instruction and feedback;Functional tasks   Potential to Achieve Goals Good   Potential Considerations Ability to learn/carryover information;Cooperation/participation level;Family/community support   SLP Home Exercise Plan Perplexor, letter scanning   Consulted and Agree with Plan of Care Patient      Patient will benefit from skilled therapeutic intervention in order to improve the following deficits and impairments:   Cognitive communication deficit    Problem List Patient Active Problem List   Diagnosis Date Noted  . Primary adenocarcinoma of lower lobe of left lung ([place]) 09/04/2016   [name], MS/CCC- SLP  [name] 12/19/2016, 8:44 AM  [place] MAIN [facility] SERVICES [address] [place], [place], [address] Phone: [phone]   Fax:  [phone]   Name: Monica Collins MRN: [id number] Date of Birth: Sep 07, 1945

## 2016-12-20 ENCOUNTER — Encounter: Payer: Self-pay | Admitting: Occupational Therapy

## 2016-12-20 NOTE — Therapy (Signed)
[place] MAIN [facility] SERVICES [address] [place], [place], [address] Phone: [phone]   Fax:  [phone]  Occupational Therapy Treatment  Patient Details  Name: Monica Collins MRN: [id number] Date of Birth: 1946-01-26 Referring Provider: Dr. Dina Rich  Encounter Date: 12/16/2016      OT End of Session - 12/20/16 1517    Visit Number 6   Number of Visits 24   Date for OT Re-Evaluation 02/12/17   Authorization Type Medicare G code 6 of 10   OT Start Time 6962   OT Stop Time 1515   OT Time Calculation (min) 32 min   Activity Tolerance Patient tolerated treatment well   Behavior During Therapy [facility] for tasks assessed/performed      Past Medical History:  Diagnosis Date  . Hypertension     Past Surgical History:  Procedure Laterality Date  . CHOLECYSTECTOMY      There were no vitals filed for this visit.      Subjective Assessment - 12/20/16 1511    Subjective  Patient reports she has back pain today, 3/10.  She reports she mopped her kitchen yesterday by herself.    Patient Stated Goals Patient reports she would like to be independent and do as much as she can for herself.    Currently in Pain? Yes   Pain Score 3    Pain Location Back   Pain Orientation Lower   Pain Descriptors / Indicators Aching   Pain Type Acute pain                      OT Treatments/Exercises (OP) - 12/20/16 1512      Fine Motor Coordination   Other Fine Motor Exercises Manipulation of [place] discs with cues for turning/flipping with cues for form and technique.       Neurological Re-education Exercises   Other Exercises 1 Patient seen for strengthening exercises with 1# elbow flexion/extension 5 reps for 2 sets.  Grip strength for sustained gripping with 11# for 25 reps for 2 sets, unable to perform 17# setting, cues for technique.  Finger strengthening with push pins, unable to place into larger moderate resistance board but is  able to complete on smaller light resistance board.  She can remove the pins from the larger board.  All exercises performed with left hand.                OT Education - 12/20/16 1516    Education provided Yes   Education Details strengthening of fingers, coordination.          OT Short Term Goals - 11/20/16 1720      OT SHORT TERM GOAL #1   Title --   Baseline --   Time --   Period --   Status --   Target Date --     OT SHORT TERM GOAL #2   Title --   Baseline --   Time --   Period --   Status --   Target Date --     OT SHORT TERM GOAL #3   Title --   Baseline --   Time --   Period --   Status --   Target Date --     OT SHORT TERM GOAL #4   Title --   Baseline --   Time --   Period --   Status --   Target Date --     OT SHORT  TERM GOAL #5   Title --   Baseline --   Time --   Period --   Status --   Target Date --     OT SHORT TERM GOAL #6   Title --   Baseline --   Time --   Period --   Status --   Target Date --     OT SHORT TERM GOAL #7   Title --   Baseline --   Time --   Period --   Status --   Target Date --           OT Long Term Goals - 11/20/16 1742      OT LONG TERM GOAL #1   Title Pt. will improve left hand [facility] skills by 5 sec. to assist with grooming.   Baseline R: 25, L: 47   Time 2   Period Weeks   Status New   Target Date 02/12/17     OT LONG TERM GOAL #2   Title Pt. will improve left grip strength by 5# to be able to open jars, bolttles   Baseline R: 32, L: 18   Time 12   Period Weeks   Status New   Target Date 02/12/17     OT LONG TERM GOAL #3   Title Pt. will improve left 3pt. pinch strength by 3# to be able to hold, and use a toothbrush.   Baseline R: 12#, L: 8#   Time 12   Period Weeks   Status New   Target Date 02/12/17     OT LONG TERM GOAL #4   Title Pt. will be independent with  UE HEP program   Baseline Assist required   Time 12   Period Weeks   Status New   Target Date  02/12/17     OT LONG TERM GOAL #5   Title Pt. will independently follow a simple cold recipe.   Baseline Pt. is unable   Time 12   Period Weeks   Status New   Target Date 02/12/17     OT LONG TERM GOAL #6   Title Pt. will scan to the left 100% of the time with minimal cues to be able to navigate her environment during IADL tasks using visual compensatory strategies.   Baseline Pt. has difficulty   Time 12   Period Weeks   Status New   Target Date 02/12/17     OT LONG TERM GOAL #7   Title Pt. will scan to the left 100% of the time with minimal cues during tabletop ADL tasks, using visual compensatory strategies.   Baseline Pt. has difficulty   Time 12   Period Weeks   Status New   Target Date 02/12/17               Plan - 12/20/16 1517    Clinical Impression Statement Patient late to session this date therefore limited treatment time.  Patient continues to progress with left UE strength and coordination skills.  She is becoming more consistent with completion of tasks at home and is advancing to more homemaking tasks.  Difficulty with moderate resistance push pins into bulletin board with left hand but able to complete light resistance.    Rehab Potential Good   Current Impairments/barriers affecting progress: Positive Indicators: familiy support, Age. Negative Indicators: multiple comorbidities.   OT Frequency 2x / week   OT Duration 12 weeks   OT Treatment/Interventions Self-care/ADL training;Therapeutic exercise;Energy conservation;DME  and/or AE instruction;Therapeutic activities;Therapeutic exercises;Manual Therapy;Neuromuscular education;Patient/family education;Visual/perceptual remediation/compensation;Cognitive remediation/compensation   Consulted and Agree with Plan of Care Patient   Family Member Consulted --      Patient will benefit from skilled therapeutic intervention in order to improve the following deficits and impairments:  Pain, Impaired UE functional  use, Decreased strength, Decreased coordination, Decreased cognition, Decreased range of motion, Decreased activity tolerance, Impaired vision/preception, Decreased safety awareness, Decreased balance, Decreased knowledge of precautions  Visit Diagnosis: Muscle weakness (generalized)  Other lack of coordination    Problem List Patient Active Problem List   Diagnosis Date Noted  . Primary adenocarcinoma of lower lobe of left lung ([place]) 09/04/2016   Errica Dutil T Jem Castro, OTR/L, CLT  Hedaya Latendresse 12/20/2016, 3:21 PM  [place] MAIN [facility] SERVICES [address] [place], [place], [address] Phone: [phone]   Fax:  [phone]  Name: JANNELL FRANTA MRN: [id number] Date of Birth: 1945-10-28

## 2016-12-20 NOTE — Therapy (Signed)
[place] MAIN [facility] SERVICES [address] [place], [place], [address] Phone: [phone]   Fax:  [phone]  Occupational Therapy Treatment  Patient Details  Name: Monica Collins MRN: [id number] Date of Birth: 1945-08-12 Referring Provider: Dr. Dina Rich  Encounter Date: 12/18/2016      OT End of Session - 12/20/16 2037    Visit Number 7   Number of Visits 24   Date for OT Re-Evaluation 02/12/17   Authorization Type Medicare G code 7 of 10   OT Start Time 1430   OT Stop Time 1516   OT Time Calculation (min) 46 min   Activity Tolerance Patient tolerated treatment well   Behavior During Therapy [facility] for tasks assessed/performed      Past Medical History:  Diagnosis Date  . Hypertension     Past Surgical History:  Procedure Laterality Date  . CHOLECYSTECTOMY      There were no vitals filed for this visit.      Subjective Assessment - 12/20/16 2031    Subjective  Patient reports she would like to try to finish up with therapy next week.  She feels she is doing well and is close to meeting her goals.    Patient Stated Goals Patient reports she would like to be independent and do as much as she can for herself.    Currently in Pain? No/denies   Pain Score 0-No pain                      OT Treatments/Exercises (OP) - 12/20/16 2032      ADLs   ADL Comments Review of goals, reassessment of tasks:  Patient now completing bathing with modified independence from seated position on shower chair.  Dressing with modified independence for both UB and LB.  Shower transfers modified independent.  Patient now able to dust, sweep, light mopping, laundry and a little bit of cooking with modified independence.  She has not made her bed yet.  She has difficulty with vacuuming, lifting heavier pots, heavy meal prep and working in her flower garden.       Fine Motor Coordination   Other Fine Motor Exercises Reassessment of 9 hole peg  test left 29 secs.      Neurological Re-education Exercises   Other Exercises 1 Reassessment of skills and goals:  Grip strength right 38#, left 29#.  Left lateral pinch 12#, 3 point pinch 12#, right lateral 10#, left 13#.        Patient also seen for 2# dowel exercises for BUE strengthening for 2 sets of 15 reps for shoulder flexion, chest press, forwards/backwards circles, ABD, ADD          OT Education - 12/20/16 2036    Education provided Yes   Education Details functional use of UEs for tasks around the home and for self care.    Person(s) Educated Patient   Methods Explanation;Demonstration;Verbal cues   Comprehension Verbal cues required;Returned demonstration;Verbalized understanding          OT Short Term Goals - 11/20/16 1720      OT SHORT TERM GOAL #1   Title --   Baseline --   Time --   Period --   Status --   Target Date --     OT SHORT TERM GOAL #2   Title --   Baseline --   Time --   Period --   Status --   Target Date --  OT SHORT TERM GOAL #3   Title --   Baseline --   Time --   Period --   Status --   Target Date --     OT SHORT TERM GOAL #4   Title --   Baseline --   Time --   Period --   Status --   Target Date --     OT SHORT TERM GOAL #5   Title --   Baseline --   Time --   Period --   Status --   Target Date --     OT SHORT TERM GOAL #6   Title --   Baseline --   Time --   Period --   Status --   Target Date --     OT SHORT TERM GOAL #7   Title --   Baseline --   Time --   Period --   Status --   Target Date --           OT Long Term Goals - 12/20/16 2039      OT LONG TERM GOAL #1   Title Pt. will improve left hand [facility] skills by 5 sec. to assist with grooming.   Baseline R: 25, L: 47   Time 2   Period Weeks   Status Achieved     OT LONG TERM GOAL #2   Title Pt. will improve left grip strength by 5# to be able to open jars, bolttles   Baseline R: 32, L: 18   Time 12   Period Weeks   Status  Achieved     OT LONG TERM GOAL #3   Title Pt. will improve left 3pt. pinch strength by 3# to be able to hold, and use a toothbrush.   Baseline R: 12#, L: 8#   Time 12   Period Weeks   Status Achieved     OT LONG TERM GOAL #4   Title Pt. will be independent with  UE HEP program   Baseline Assist required   Time 12   Period Weeks   Status On-going     OT LONG TERM GOAL #5   Title Pt. will independently follow a simple cold recipe.   Baseline Pt. is unable   Time 12   Period Weeks   Status Achieved     OT LONG TERM GOAL #6   Title Pt. will scan to the left 100% of the time with minimal cues to be able to navigate her environment during IADL tasks using visual compensatory strategies.   Baseline Pt. has difficulty   Time 12   Period Weeks   Status On-going     OT LONG TERM GOAL #7   Title Pt. will scan to the left 100% of the time with minimal cues during tabletop ADL tasks, using visual compensatory strategies.   Baseline Pt. has difficulty   Time 12   Period Weeks   Status On-going               Plan - 12/20/16 2037    Clinical Impression Statement Patient has made excellent gains in therapy for strength and coordination.  She still has some difficulty with vacuuming, lifting heavier pots, meal prep and working in her flower garden.  She would like to try to wrap up therapy sessions next week, will plan to instruct in final home program and offer recommendations/options for many of the difficulties she still has.    Rehab Potential  Good   Current Impairments/barriers affecting progress: Positive Indicators: familiy support, Age. Negative Indicators: multiple comorbidities.   OT Frequency 2x / week   OT Duration 12 weeks   OT Treatment/Interventions Self-care/ADL training;Therapeutic exercise;Energy conservation;DME and/or AE instruction;Therapeutic activities;Therapeutic exercises;Manual Therapy;Neuromuscular education;Patient/family education;Visual/perceptual  remediation/compensation;Cognitive remediation/compensation   Consulted and Agree with Plan of Care Patient      Patient will benefit from skilled therapeutic intervention in order to improve the following deficits and impairments:  Pain, Impaired UE functional use, Decreased strength, Decreased coordination, Decreased cognition, Decreased range of motion, Decreased activity tolerance, Impaired vision/preception, Decreased safety awareness, Decreased balance, Decreased knowledge of precautions  Visit Diagnosis: Muscle weakness (generalized)  Other lack of coordination    Problem List Patient Active Problem List   Diagnosis Date Noted  . 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 [place], [place], [address] Phone: [phone]   Fax:  [phone]  Name: Monica Collins MRN: [id number] Date of Birth: Sep 29, 1945

## 2016-12-23 ENCOUNTER — Ambulatory Visit: Payer: Medicare Other | Admitting: Occupational Therapy

## 2016-12-23 ENCOUNTER — Ambulatory Visit: Payer: Medicare Other

## 2016-12-23 ENCOUNTER — Ambulatory Visit: Payer: Medicare Other | Admitting: Speech Pathology

## 2016-12-23 DIAGNOSIS — M6281 Muscle weakness (generalized): Secondary | ICD-10-CM

## 2016-12-23 DIAGNOSIS — R41841 Cognitive communication deficit: Secondary | ICD-10-CM

## 2016-12-23 DIAGNOSIS — R278 Other lack of coordination: Secondary | ICD-10-CM

## 2016-12-23 DIAGNOSIS — R2689 Other abnormalities of gait and mobility: Secondary | ICD-10-CM

## 2016-12-23 NOTE — Therapy (Signed)
[place] MAIN [facility] SERVICES [address] [place], [place], [address] Phone: [phone]   Fax:  [phone]  Physical Therapy Treatment  Patient Details  Name: Monica Collins MRN: [id number] Date of Birth: Dec 31, 1945 Referring Provider: Leretha Dykes, DO  Encounter Date: 12/23/2016      PT End of Session - 12/23/16 1757    Visit Number 8   Number of Visits 10   Date for PT Re-Evaluation 12/30/16   Authorization - Visit Number 8   Authorization - Number of Visits 10   PT Start Time 6599   PT Stop Time 1600   PT Time Calculation (min) 44 min   Equipment Utilized During Treatment Gait belt   Activity Tolerance Patient tolerated treatment well   Behavior During Therapy [facility] for tasks assessed/performed      Past Medical History:  Diagnosis Date  . Hypertension     Past Surgical History:  Procedure Laterality Date  . CHOLECYSTECTOMY      There were no vitals filed for this visit.      Subjective Assessment - 12/23/16 1520    Subjective Patient reports no difficulty at home. 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 Left lower lobe resection on 10-17-16. She was in the hospital for 30 days. 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    Limitations Lifting;Walking;House hold activities   How long can you sit comfortably? N/A   How long can you stand comfortably? 15 minutes   How long can you walk comfortably? 20 minutes   Patient Stated Goals do stuff around the house   Currently in Pain? Yes   Pain Score 3    Pain Location Back   Pain Orientation Lower   Pain Descriptors / Indicators Aching   Pain Type Chronic pain   Pain Onset Yesterday   Pain Frequency Occasional            [facility] PT Assessment - 12/23/16 0001      Berg Balance Test   Sit to Stand Able to stand without using hands and stabilize independently   Standing Unsupported Able to stand safely 2 minutes   Sitting with Back Unsupported but Feet Supported on Floor or Stool Able to sit safely and securely 2 minutes   Stand to Sit Sits safely with minimal use of hands   Transfers Able to transfer safely, minor use of hands   Standing Unsupported with Eyes Closed Able to stand 10 seconds safely   Standing Ubsupported with Feet Together Able to place feet together independently and stand 1 minute safely   From Standing, Reach Forward with Outstretched Arm Can reach confidently >25 cm (10")   From Standing Position, Pick up Object from Floor Able to pick up shoe safely and easily   From Standing Position, Turn to Look Behind Over each Shoulder Looks behind from both sides and weight shifts well   Turn 360 Degrees Able to turn 360 degrees safely in 4 seconds or less   Standing Unsupported, Alternately Place Feet on Step/Stool Able to stand independently and safely and complete 8 steps in 20 seconds   Standing Unsupported, One Foot in Front Able to place foot tandem independently and hold 30 seconds   Standing on One Leg Able to lift  leg independently and hold > 10 seconds   Total Score 56    BERG 56/56  Single leg stance 2x30 seconds each foot (4x total) Sit to stand no UE assist 10x  Hip extension standing with YTB 10x each leg Hip abduction standing with YTB 10x each leg Hip flexion standing with YTB 10x each leg Wall squats 10x Bridging 10x , repeat another 10x     Neuro Re-ed: Ascend/descend flight of stairs in hospital stairway: utilized 0 handrails or UE support, reciprocal pattern up and down, no episodes of LOB                   PT Education - 12/23/16 1756    Education provided Yes   Education Details HEP compliance after d/c   Person(s) Educated Patient   Methods Explanation;Demonstration;Handout   Comprehension Verbalized  understanding;Returned demonstration             PT Long Term Goals - 12/23/16 1528      PT LONG TERM GOAL #1   Title Patient will increase Berg Balance score by > 6 points (55/56) to demonstrate decreased fall risk during functional activities.   Baseline 7/30: 49/56 8/22: 54/56 8/27: 56/56   Time 6   Period Weeks   Status Achieved     PT LONG TERM GOAL #2   Title Patient will increase 10 meter walk test to >1.24ms as to improve gait speed for better community ambulation and to reduce fall risk.   Baseline 7/30: .71 8/22: 1.25 m/s   Time 6   Period Weeks   Status Achieved     PT LONG TERM GOAL #3   Title Patient will increase ABC scale score >80% to demonstrate better functional mobility and better confidence with ADLs.    Baseline 7/30: 40% 8/22: 83.13%   Time 6   Period Weeks   Status Achieved     PT LONG TERM GOAL #4   Title Patient will increase lower extremity functional scale to >50/80 to demonstrate improved functional mobility and increased tolerance with ADLs.    Baseline 7/30: 24/80 8/22: 62/80   Time 6   Period Weeks   Status Achieved     PT LONG TERM GOAL #5   Title Patient (> 8103years old) will complete five times sit to stand test in < 15 seconds indicating an increased LE strength and improved balance.   Baseline 7/30 18 sec 8/22: 14 seconds   Time 6   Period Weeks   Status Achieved               Plan - 12/23/16 1758    Clinical Impression Statement Patient has met all goals at this time and is ready for discharge. BERG=56/56. Patient is not limited in daily life at this time and has demonstrated safe stair negotiation. New HEP provided and patient demonstrated understanding.    Rehab Potential Fair   Clinical Impairments Affecting Rehab Potential This patient presents with 3, personal factors/ comorbidities and 3 ,   body elements including body structures and functions, activity limitations and or participation restrictions. Patient's  condition is evolving.   PT Frequency 2x / week   PT Duration 6 weeks   PT Treatment/Interventions ADLs/Self Care Home Management;Biofeedback;Cryotherapy;EScientist, product/process developmentIontophoresis 474mml Dexamethasone;Moist Heat;Ultrasound;Traction;Gait training;Stair training;Functional mobility training;Therapeutic activities;Therapeutic exercise;Balance training;Patient/family education;Cognitive remediation;Neuromuscular re-education;Manual techniques;Compression bandaging;Passive range of motion;Energy conservation;Taping;Visual/perceptual remediation/compensation   PT Home Exercise Plan see sheet   Consulted and Agree with Plan of Care Patient;Family  member/caregiver      Patient will benefit from skilled therapeutic intervention in order to improve the following deficits and impairments:  Abnormal gait, Decreased activity tolerance, Decreased balance, Decreased knowledge of precautions, Decreased endurance, Decreased coordination, Decreased cognition, Decreased knowledge of use of DME, Decreased mobility, Decreased range of motion, Decreased safety awareness, Difficulty walking, Decreased strength, Impaired flexibility, Impaired perceived functional ability, Impaired vision/preception, Postural dysfunction, Improper body mechanics, Pain  Visit Diagnosis: Muscle weakness (generalized)  Other lack of coordination  Other abnormalities of gait and mobility       G-Codes - December 24, 2016 1759    Functional Assessment Tool Used (Outpatient Only) 10MWT, 5xSTS, BERG, ABC-S, LEFS, clinical judgement, MMT   Functional Limitation Mobility: Walking and moving around   Mobility: Walking and Moving Around Current Status [phone]) At least 1 percent but less than 20 percent impaired, limited or restricted   Mobility: Walking and Moving Around Goal Status [phone]) At least 1 percent but less than 20 percent impaired, limited or restricted   Mobility: Walking and Moving Around Discharge Status  [phone]) At least 1 percent but less than 20 percent impaired, limited or restricted      Problem List Patient Active Problem List   Diagnosis Date Noted  . 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 [place], [place], [address] Phone: [phone]   Fax:  [phone]  Name: Monica Collins MRN: [id number] Date of Birth: [birth year]

## 2016-12-23 NOTE — Therapy (Signed)
[place] MAIN [facility] SERVICES [address] [place], [place], [address] Phone: [phone]   Fax:  [phone]  Occupational Therapy Treatment  Patient Details  Name: Monica Collins MRN: [id number] Date of Birth: 1946-01-16 Referring Provider: Dr. Dina Rich  Encounter Date: 12/23/2016      OT End of Session - 12/23/16 1438    Visit Number 8   Number of Visits 24   Date for OT Re-Evaluation 02/12/17   Authorization Type Medicare G code 8 of 10   OT Start Time 4332   OT Stop Time 1515   OT Time Calculation (min) 43 min   Activity Tolerance Patient tolerated treatment well      Past Medical History:  Diagnosis Date  . Hypertension     Past Surgical History:  Procedure Laterality Date  . CHOLECYSTECTOMY      There were no vitals filed for this visit.      Subjective Assessment - 12/23/16 1435    Subjective  Pt. reports she is making progress daily.   Patient is accompained by: Family member   Patient Stated Goals Patient reports she would like to be independent and do as much as she can for herself.    Currently in Pain? Yes   Pain Score 3    Pain Location Back      OT TREATMENT    Neuro muscular re-education:  Pt. worked on tasks to sustain lateral pinch on resistive tweezers while grasping and moving 2" toothpick sticks from a horizontal flat position to a vertical position in order to place it in the holder. Pt. was able to sustain grasp while positioning and extending the wrist/hand in the necessary alignment needed to place the stick through the top of the holder. Pt. performed left [facility] tasks using the Grooved pegboard. Pt. worked on grasping the grooved pegs from a horizontal position, and moving the pegs to a vertical position in the hand to prepare for placing them in the grooved slot.   Therapeutic Exercise:  3# dumbbell ex. for elbow flexion and extension, forearm supination/pronation, wrist flexion/extension, and  radial deviation. Pt. requires rest breaks and verbal cues for proper technique.Pt. performed gross gripping with grip strengthener. Pt. worked on sustaining grip while grasping pegs and reaching at various heights. Gripper was placed in the 2nd resistive slot with the white resistive spring. Pt. Worked on pinch strengthening in the left hand for lateral, and 3pt. pinch using yellow, red, green, and blue resistive clips. Pt. worked on placing the clips at various vertical and horizontal angles. Tactile and verbal cues were required for eliciting the desired movement. Pt. Worked on pinch strengthening in the left hand for lateral, and 3pt. pinch using yellow, red, green, and blue resistive clips. Pt. worked on placing the clips at various vertical and horizontal angles. Tactile and verbal cues were required for eliciting the desired movement.                            OT Education - 12/23/16 1437    Education provided Yes   Education Details BUE functioning during ADLs/IADLs   Person(s) Educated Patient   Methods Explanation   Comprehension Verbalized understanding;Returned demonstration;Verbal cues required          OT Short Term Goals - 11/20/16 1720      OT SHORT TERM GOAL #1   Title --   Baseline --   Time --  Period --   Status --   Target Date --     OT SHORT TERM GOAL #2   Title --   Baseline --   Time --   Period --   Status --   Target Date --     OT SHORT TERM GOAL #3   Title --   Baseline --   Time --   Period --   Status --   Target Date --     OT SHORT TERM GOAL #4   Title --   Baseline --   Time --   Period --   Status --   Target Date --     OT SHORT TERM GOAL #5   Title --   Baseline --   Time --   Period --   Status --   Target Date --     OT SHORT TERM GOAL #6   Title --   Baseline --   Time --   Period --   Status --   Target Date --     OT SHORT TERM GOAL #7   Title --   Baseline --   Time --   Period --    Status --   Target Date --           OT Long Term Goals - 12/20/16 2039      OT LONG TERM GOAL #1   Title Pt. will improve left hand [facility] skills by 5 sec. to assist with grooming.   Baseline R: 25, L: 47   Time 2   Period Weeks   Status Achieved     OT LONG TERM GOAL #2   Title Pt. will improve left grip strength by 5# to be able to open jars, bolttles   Baseline R: 32, L: 18   Time 12   Period Weeks   Status Achieved     OT LONG TERM GOAL #3   Title Pt. will improve left 3pt. pinch strength by 3# to be able to hold, and use a toothbrush.   Baseline R: 12#, L: 8#   Time 12   Period Weeks   Status Achieved     OT LONG TERM GOAL #4   Title Pt. will be independent with  UE HEP program   Baseline Assist required   Time 12   Period Weeks   Status On-going     OT LONG TERM GOAL #5   Title Pt. will independently follow a simple cold recipe.   Baseline Pt. is unable   Time 12   Period Weeks   Status Achieved     OT LONG TERM GOAL #6   Title Pt. will scan to the left 100% of the time with minimal cues to be able to navigate her environment during IADL tasks using visual compensatory strategies.   Baseline Pt. has difficulty   Time 12   Period Weeks   Status On-going     OT LONG TERM GOAL #7   Title Pt. will scan to the left 100% of the time with minimal cues during tabletop ADL tasks, using visual compensatory strategies.   Baseline Pt. has difficulty   Time 12   Period Weeks   Status On-going               Plan - 12/23/16 1439    Clinical Impression Statement Pt. reports she has been sweeping moreat home, and even sweeped off her front porch.  Pt. continues  to work on improving LUE strength, and coordination skills during ADLs, and IADLs.       Patient will benefit from skilled therapeutic intervention in order to improve the following deficits and impairments:     Visit Diagnosis: No diagnosis found.    Problem List Patient Active Problem  List   Diagnosis Date Noted  . 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

## 2016-12-24 ENCOUNTER — Encounter: Payer: Self-pay | Admitting: Speech Pathology

## 2016-12-24 NOTE — Therapy (Signed)
[place] MAIN [facility] SERVICES [address] [place], [place], [address] Phone: [phone]   Fax:  [phone]  Speech Language Pathology Treatment  Patient Details  Name: Monica Collins MRN: [id number] Date of Birth: [birth year] Referring Provider: Dr. Dina Rich  Encounter Date: 12/23/2016      End of Session - 12/24/16 1320    Visit Number 9   Number of Visits 17   Date for SLP Re-Evaluation 01/21/17   SLP Start Time 1600   SLP Stop Time  1655   SLP Time Calculation (min) 55 min   Activity Tolerance Patient tolerated treatment well      Past Medical History:  Diagnosis Date  . Hypertension     Past Surgical History:  Procedure Laterality Date  . CHOLECYSTECTOMY      There were no vitals filed for this visit.      Subjective Assessment - 12/24/16 1319    Subjective The patient enjoys a cognitive challenge   Currently in Pain? No/denies               ADULT SLP TREATMENT - 12/24/16 0001      General Information   Behavior/Cognition Alert;Cooperative;Pleasant mood   HPI Patient is a 71 year old woman who suffered a right MCA aneurysm; right Craniectomy and Clip Ligation of aneurysm performed on 10/23/16. Patient's neurological state deteriorated on 10/29/16, and head CT showed edema around aneurysm clip site. She spontaneously improved to her baseline that same day. On 10/31/16, patient's family noticed patient was having seizure-like activity with pill-rolling hand movements. Patient was monitored on an EEG and no electrographic seizures were noted over 48 hours of continuous monitoring. She was evaluated by SLP for swallowing and is on a regular diet with thin liquids as of 11/04/16. SLP noted that patient has low vocal intensity. Patient was [facility] for oral mech exam, orientation, and memory evaluations but demonstrated deficits in problem solving, safety/judgement, visual perception (left neglect), comprehension, and  pragmatics/prosody. SLP recommended intensive speech therapy. 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 Patient's prior level of functioning was [facility] before the aneurysm.       Treatment Provided   Treatment provided Cognitive-Linquistic     Pain Assessment   Pain Assessment No/denies pain     Cognitive-Linquistic Treatment   Treatment focused on Cognition;Patient/family/caregiver education   Skilled Treatment VISUAL PROCESSING: Complete moderately complex visual processing activities with overall 80% accuracy and min SLP cues.  LANGUAGE/COGNITIVE SKILLS: Independently read and complete Exercise 1 of [employer] Executive Function program.       Assessment / Recommendations / Plan   Plan Continue with current plan of care     Progression Toward Goals   Progression toward goals Progressing toward goals          SLP Education - 12/24/16 1319    Education provided Yes   Education Details Executive skills   Person(s) Educated Patient   Methods Explanation   Comprehension Verbalized understanding            SLP Long Term Goals - 11/21/16 0835      SLP LONG TERM GOAL #1   Title Patient will identify cognitive barriers and participate in developing functional compensatory strategies.   Time 8   Period Weeks   Status New     SLP LONG TERM GOAL #2   Title Patient will demonstrate functional cognitive-communication skills for independent completion of personal responsibilities.  Time 8   Period Weeks   Status New     SLP LONG TERM GOAL #3   Title Patient will complete complex visual-spatial activities with 80% accuracy.   Time 8   Period Weeks   Status New     SLP LONG TERM GOAL #4   Title Patient will complete attention, executive function skills, and memory strategy activities with 80% accuracy.   Time 8   Period Weeks   Status New          Plan - 12/24/16 1320    Clinical Impression Statement The  patient is demonstrating improving attention and visual processing skills.  She demonstrates increased initiation of speech and a brighter affect.  She demonstrates improved processing speed.  She is able to read directions for tasks and follow through.  She responds to feedback and can improve performance when cued.   Speech Therapy Frequency 2x / week   Duration Other (comment)   Treatment/Interventions Compensatory techniques;Patient/family education;Compensatory strategies;SLP instruction and feedback;Functional tasks   Potential to Achieve Goals Good   Potential Considerations Ability to learn/carryover information;Cooperation/participation level;Family/community support   SLP Home Exercise Plan Executive Function, exercise 2; visual processing worksheets   Consulted and Agree with Plan of Care Patient      Patient will benefit from skilled therapeutic intervention in order to improve the following deficits and impairments:   Cognitive communication deficit    Problem List Patient Active Problem List   Diagnosis Date Noted  . Primary adenocarcinoma of lower lobe of left lung ([place]) 09/04/2016   [name], MS/CCC- SLP  [name] 12/24/2016, Wandra Mannan PM  [place] MAIN [facility] SERVICES [address] [place], [place], [address] Phone: [phone]   Fax:  [phone]   Name: Monica Collins MRN: [id number] Date of Birth: 1945-06-20

## 2016-12-25 ENCOUNTER — Ambulatory Visit: Payer: Medicare Other

## 2016-12-25 ENCOUNTER — Ambulatory Visit: Payer: Medicare Other | Admitting: Speech Pathology

## 2016-12-25 ENCOUNTER — Ambulatory Visit: Payer: Medicare Other | Admitting: Occupational Therapy

## 2016-12-25 DIAGNOSIS — M6281 Muscle weakness (generalized): Secondary | ICD-10-CM | POA: Diagnosis not present

## 2016-12-25 DIAGNOSIS — R278 Other lack of coordination: Secondary | ICD-10-CM | POA: Diagnosis not present

## 2016-12-25 DIAGNOSIS — R41841 Cognitive communication deficit: Secondary | ICD-10-CM | POA: Diagnosis not present

## 2016-12-25 DIAGNOSIS — R2689 Other abnormalities of gait and mobility: Secondary | ICD-10-CM | POA: Diagnosis not present

## 2016-12-25 NOTE — Therapy (Signed)
[place] MAIN [facility] SERVICES [address] [place], [place], [address] Phone: [phone]   Fax:  [phone]  Occupational Therapy Treatment/Discharge Note  Patient Details  Name: Monica Collins MRN: [id number] Date of Birth: [birth year] Referring Provider: Dr. Dina Rich  Encounter Date: 12/25/2016      OT End of Session - 12/25/16 1541    Visit Number 9   Number of Visits 24   Date for OT Re-Evaluation 02/12/17   Authorization Type Medicare G code 9 of 10   OT Start Time 0998   OT Stop Time 1515   OT Time Calculation (min) 43 min   Activity Tolerance Patient tolerated treatment well   Behavior During Therapy [facility] for tasks assessed/performed      Past Medical History:  Diagnosis Date  . Hypertension     Past Surgical History:  Procedure Laterality Date  . CHOLECYSTECTOMY      There were no vitals filed for this visit.      Subjective Assessment - 12/25/16 1539    Subjective  Pt. was accompanied by her daughter.   Patient is accompained by: Family member   Patient Stated Goals Patient reports she would like to be independent and do as much as she can for herself.    Currently in Pain? No/denies            [facility] OT Assessment - 12/25/16 1535      Coordination   Right 9 Hole Peg Test 18   Left 9 Hole Peg Test 28     Strength   Overall Strength Comments LUE strength: 5/5 overall     Hand Function   Right Hand Grip (lbs) 43   Right Hand Lateral Pinch 12 lbs   Right Hand 3 Point Pinch 15 lbs   Left Hand Grip (lbs) 28   Left Hand Lateral Pinch 11 lbs   Left 3 point pinch 13 lbs       OT TREATMENT:  Therapeutic Exercise:  Measurements were obtained. Pt. Was upgraded to green theraputty. Pt. Education was provided about home exercises.  Selfcare:  Pt. Goals were reviewed with pt. Pt./family education was provided about compensatory strategies using a pillbox for medication management, and reminders around the  house for turning turning off faucettes, closing the microwave door, and closing doors.                         OT Education - 12/25/16 1540    Education provided Yes   Education Details UE, and ADL functioning.   Person(s) Educated Patient   Methods Explanation   Comprehension Verbalized understanding             OT Long Term Goals - 12/25/16 1553      OT LONG TERM GOAL #1   Title Pt. will improve left hand [facility] skills by 5 sec. to assist with grooming.   Baseline R: 25, L: 47   Time 2   Period Weeks   Status Achieved   Target Date 02/12/17     OT LONG TERM GOAL #2   Title Pt. will improve left grip strength by 5# to be able to open jars, bolttles   Baseline R: 32, L: 18   Time 12   Period Weeks   Status Achieved   Target Date 02/12/17     OT LONG TERM GOAL #3   Title Pt. will improve left 3pt. pinch strength by 3#  to be able to hold, and use a toothbrush.   Baseline R: 12#, L: 8#   Time 12   Period Weeks   Status Achieved   Target Date 02/12/17     OT LONG TERM GOAL #4   Title Pt. will be independent with  UE HEP program   Baseline Assist required   Time 12   Period Weeks   Status On-going   Target Date 02/12/17     OT LONG TERM GOAL #5   Title Pt. will independently follow a simple cold recipe.   Baseline Pt. is unable   Time 12   Period Weeks   Status Achieved   Target Date 02/12/17     OT LONG TERM GOAL #6   Title Pt. will scan to the left 100% of the time with minimal cues to be able to navigate her environment during IADL tasks using visual compensatory strategies.   Baseline Pt. has difficulty   Time 12   Period Weeks   Status Achieved   Target Date 02/12/17     OT LONG TERM GOAL #7   Title Pt. will scan to the left 100% of the time with minimal cues during tabletop ADL tasks, using visual compensatory strategies.   Baseline Pt. has difficulty   Time 12   Period Weeks   Status On-going   Target Date 02/12/17                Plan - 12/25/16 1541    Clinical Impression Statement Pt. reports she is hoping to finish with therapy, as she just finished with PT. Pt. has made excellent progress, has improved with vision, and LUE functioning. Pt. has met goals and is now doing more during ADL/IADL tasks. Pt. continues to have difficulty remembering to take medication, and and remebering to turn the faucette off, or shut the door. Compensatory strategies were reviewed with the pt., and her daughter. pt. is appropriate for discharge from OT services.   Occupational performance deficits (Please refer to evaluation for details): ADL's;IADL's   Rehab Potential Good   Current Impairments/barriers affecting progress: Positive Indicators: familiy support, Age. Negative Indicators: multiple comorbidities.   OT Frequency 2x / week   OT Duration 12 weeks   OT Treatment/Interventions Self-care/ADL training;Therapeutic exercise;Energy conservation;DME and/or AE instruction;Therapeutic activities;Therapeutic exercises;Manual Therapy;Neuromuscular education;Patient/family education;Visual/perceptual remediation/compensation;Cognitive remediation/compensation   Consulted and Agree with Plan of Care Patient   Family Member Consulted daughter      Patient will benefit from skilled therapeutic intervention in order to improve the following deficits and impairments:  Pain, Impaired UE functional use, Decreased strength, Decreased coordination, Decreased cognition, Decreased range of motion, Decreased activity tolerance, Impaired vision/preception, Decreased safety awareness, Decreased balance, Decreased knowledge of precautions  Visit Diagnosis: Muscle weakness (generalized)  Other lack of coordination    Problem List Patient Active Problem List   Diagnosis Date Noted  . Primary adenocarcinoma of lower lobe of left lung ([place]) 09/04/2016    Harrel Carina, MS, OTR/L 12/25/2016, 3:58 PM  [place] MAIN [facility] SERVICES [address] [place], [place], [address] Phone: [phone]   Fax:  [phone]  Name: KALIFA CADDEN MRN: [id number] Date of Birth: 10/01/1945

## 2016-12-26 ENCOUNTER — Encounter: Payer: Self-pay | Admitting: Speech Pathology

## 2016-12-26 NOTE — Therapy (Signed)
[place] MAIN [facility] SERVICES [address] [place], [place], [address] Phone: [phone]   Fax:  [phone]  Speech Language Pathology Treatment/Progress Note  Patient Details  Name: Monica Collins MRN: [id number] Date of Birth: [birth year] Referring Provider: Dr. Dina Rich  Encounter Date: 12/25/2016      End of Session - 12/26/16 1528    Visit Number 10   Number of Visits 17   Date for SLP Re-Evaluation 01/21/17   SLP Start Time 2993   SLP Stop Time  1615   SLP Time Calculation (min) 60 min   Activity Tolerance Patient tolerated treatment well      Past Medical History:  Diagnosis Date  . Hypertension     Past Surgical History:  Procedure Laterality Date  . CHOLECYSTECTOMY      There were no vitals filed for this visit.      Subjective Assessment - 12/26/16 1527    Subjective The patient enjoys a cognitive challenge   Patient is accompained by: Family member   Currently in Pain? No/denies               ADULT SLP TREATMENT - 12/26/16 0001      General Information   Behavior/Cognition Alert;Cooperative;Pleasant mood   HPI Patient is a 71 year old woman who suffered a right MCA aneurysm; right Craniectomy and Clip Ligation of aneurysm performed on 10/23/16. Patient's neurological state deteriorated on 10/29/16, and head CT showed edema around aneurysm clip site. She spontaneously improved to her baseline that same day. On 10/31/16, patient's family noticed patient was having seizure-like activity with pill-rolling hand movements. Patient was monitored on an EEG and no electrographic seizures were noted over 48 hours of continuous monitoring. She was evaluated by SLP for swallowing and is on a regular diet with thin liquids as of 11/04/16. SLP noted that patient has low vocal intensity. Patient was [facility] for oral mech exam, orientation, and memory evaluations but demonstrated deficits in problem solving, safety/judgement, visual  perception (left neglect), comprehension, and pragmatics/prosody. SLP recommended intensive speech therapy. 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 Patient's prior level of functioning was [facility] before the aneurysm.       Treatment Provided   Treatment provided Cognitive-Linquistic     Pain Assessment   Pain Assessment No/denies pain     Cognitive-Linquistic Treatment   Treatment focused on Cognition;Patient/family/caregiver education   Skilled Treatment VISUAL PROCESSING: Complete moderately complex visual processing activities with overall 80% accuracy and min SLP cues.  LANGUAGE/COGNITIVE SKILLS: Given mod cues, complete Exercises 2 and 3 (Planning) of [employer] Executive Function program.  IDENTIFY COGNITIVE BARRIERS: The patient and daughter were able to identify a cognitive problem (forgets to turn off the water when finished with the task).  The patient participated in problem solving a strategy (leave a note at the faucet).  They identified another problem (patient has trouble operating TV remote).  Patient required assistance to state the problem completely.  The patient and her daughter will work on a strategy at home.      Assessment / Recommendations / Plan   Plan Continue with current plan of care     Progression Toward Goals   Progression toward goals Progressing toward goals          SLP Education - 12/26/16 1527    Education provided Yes   Education Details problem solving   Person(s) Educated Patient   Methods  Explanation   Comprehension Verbalized understanding            SLP Long Term Goals - 01-23-2017 1530      SLP LONG TERM GOAL #1   Title Patient will identify cognitive barriers and participate in developing functional compensatory strategies.   Time 8   Period Weeks   Status Partially Met     SLP LONG TERM GOAL #2   Title Patient will demonstrate functional cognitive-communication skills for  independent completion of personal responsibilities.    Time 8   Period Weeks   Status Partially Met     SLP LONG TERM GOAL #3   Title Patient will complete complex visual-spatial activities with 80% accuracy.   Time 8   Period Weeks   Status Partially Met     SLP LONG TERM GOAL #4   Title Patient will complete attention, executive function skills, and memory strategy activities with 80% accuracy.   Time 8   Period Weeks   Status Partially Met          Plan - 2017/01/23 1529    Clinical Impression Statement The patient is demonstrating improving attention and visual processing skills.  She demonstrates increased initiation of speech and a brighter affect.  She demonstrates improved processing speed.  She is able to read directions for tasks and follow through.  She responds to feedback and can improve performance when cued.  She continues to have difficulty clearly stating a cognitive barrier and participate in generating a strategy.  Will continue ST with focus on cognitive independence.   Speech Therapy Frequency 2x / week   Duration Other (comment)   Treatment/Interventions Compensatory techniques;Patient/family education;Compensatory strategies;SLP instruction and feedback;Functional tasks   Potential to Achieve Goals Good   Potential Considerations Ability to learn/carryover information;Cooperation/participation level;Family/community support   SLP Home Exercise Plan Develop strategy to operate TV remote, visual processing worksheets   Consulted and Agree with Plan of Care Patient;Family member/caregiver   Family Member Consulted daughter      Patient will benefit from skilled therapeutic intervention in order to improve the following deficits and impairments:   Cognitive communication deficit      G-Codes - 01/23/2017 1533    Functional Assessment Tool Used therapeutic tasks, clinical judgment   Functional Limitations Memory   Memory Current Status [phone]) At least 20  percent but less than 40 percent impaired, limited or restricted   Memory Goal Status (B1660) At least 1 percent but less than 20 percent impaired, limited or restricted      Problem List Patient Active Problem List   Diagnosis Date Noted  . Primary adenocarcinoma of lower lobe of left lung ([place]) 09/04/2016   [name], MS/CCC- SLP  [name] 2017/01/23, 3:34 PM  [place] MAIN [facility] SERVICES [address] [place], [place], [address] Phone: [phone]   Fax:  [phone]   Name: OMMIE DEGEORGE MRN: [id number] Date of Birth: November 05, 1945

## 2017-01-01 ENCOUNTER — Ambulatory Visit: Payer: Medicare Other

## 2017-01-01 ENCOUNTER — Ambulatory Visit: Payer: Medicare Other | Attending: Physical Medicine and Rehabilitation | Admitting: Speech Pathology

## 2017-01-01 ENCOUNTER — Ambulatory Visit: Payer: Medicare Other | Admitting: Occupational Therapy

## 2017-01-01 DIAGNOSIS — R41841 Cognitive communication deficit: Secondary | ICD-10-CM | POA: Diagnosis not present

## 2017-01-02 ENCOUNTER — Encounter: Payer: Self-pay | Admitting: Speech Pathology

## 2017-01-02 NOTE — Therapy (Signed)
[place] MAIN [facility] SERVICES [address] [place], [place], [address] Phone: [phone]   Fax:  [phone]  Speech Language Pathology Treatment  Patient Details  Name: Monica Collins MRN: [id number] Date of Birth: 21-Sep-1945 Referring Provider: Dr. Dina Rich  Encounter Date: 01/01/2017      End of Session - 01/02/17 1105    Visit Number 11   Number of Visits 17   Date for SLP Re-Evaluation 01/21/17   SLP Start Time 1600   SLP Stop Time  1656   SLP Time Calculation (min) 56 min      Past Medical History:  Diagnosis Date  . Hypertension     Past Surgical History:  Procedure Laterality Date  . CHOLECYSTECTOMY      There were no vitals filed for this visit.      Subjective Assessment - 01/02/17 1104    Subjective "I'm doing pretty well!"   Currently in Pain? No/denies               ADULT SLP TREATMENT - 01/02/17 0001      General Information   Behavior/Cognition Alert;Cooperative;Pleasant mood   HPI Patient is a 71 year old woman who suffered a right MCA aneurysm; right Craniectomy and Clip Ligation of aneurysm performed on 10/23/16. Patient's neurological state deteriorated on 10/29/16, and head CT showed edema around aneurysm clip site. She spontaneously improved to her baseline that same day. On 10/31/16, patient's family noticed patient was having seizure-like activity with pill-rolling hand movements. Patient was monitored on an EEG and no electrographic seizures were noted over 48 hours of continuous monitoring. She was evaluated by SLP for swallowing and is on a regular diet with thin liquids as of 11/04/16. SLP noted that patient has low vocal intensity. Patient was [facility] for oral mech exam, orientation, and memory evaluations but demonstrated deficits in problem solving, safety/judgement, visual perception (left neglect), comprehension, and pragmatics/prosody. SLP recommended intensive speech therapy. 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 Patient's prior level of functioning was [facility] before the aneurysm.       Treatment Provided   Treatment provided Cognitive-Linquistic     Pain Assessment   Pain Assessment No/denies pain     Cognitive-Linquistic Treatment   Treatment focused on Cognition;Patient/family/caregiver education   Skilled Treatment LANGUAGE/COGNITIVE SKILLS: Complete a variety of "[employer]" activities (multiple meanings, identify words, unusual definitions, solving letter/words) with overall 80% accuracy given SLP cues to follow instructions consistently, cues to attend to details, and cues for reasoning.     Assessment / Recommendations / Plan   Plan Continue with current plan of care     Progression Toward Goals   Progression toward goals Progressing toward goals          SLP Education - 01/02/17 1105    Education provided Yes   Education Details attend to details, recall/apply instructions consistently   Person(s) Educated Patient   Methods Explanation   Comprehension Verbalized understanding            SLP Long Term Goals - 12/26/16 1530      SLP LONG TERM GOAL #1   Title Patient will identify cognitive barriers and participate in developing functional compensatory strategies.   Time 8   Period Weeks   Status Partially Met     SLP LONG TERM GOAL #2   Title Patient will demonstrate functional cognitive-communication skills for independent completion of personal responsibilities.  Time 8   Period Weeks   Status Partially Met     SLP LONG TERM GOAL #3   Title Patient will complete complex visual-spatial activities with 80% accuracy.   Time 8   Period Weeks   Status Partially Met     SLP LONG TERM GOAL #4   Title Patient will complete attention, executive function skills, and memory strategy activities with 80% accuracy.   Time 8   Period Weeks   Status Partially Met          Plan -  01/02/17 1107    Clinical Impression Statement The patient is demonstrating improving attention and visual processing skills.  She demonstrates increased initiation of speech and a brighter affect.  She demonstrates improved processing speed.  She is able to read directions for tasks and follow through.  She responds to feedback and can improve performance when cued.  Patient reports applying problem solving strategy discussed last week to functional problems at home with good success.   Speech Therapy Frequency 2x / week   Duration Other (comment)   Treatment/Interventions Compensatory techniques;Patient/family education;Compensatory strategies;SLP instruction and feedback;Functional tasks   Potential to Achieve Goals Good   Potential Considerations Ability to learn/carryover information;Cooperation/participation level;Family/community support   SLP Home Exercise Plan Logical solutions, figurative language   Consulted and Agree with Plan of Care Patient      Patient will benefit from skilled therapeutic intervention in order to improve the following deficits and impairments:   Cognitive communication deficit    Problem List Patient Active Problem List   Diagnosis Date Noted  . Primary adenocarcinoma of lower lobe of left lung ([place]) 09/04/2016   [name], MS/CCC- SLP  [name] 01/02/2017, 11:08 AM  [place] MAIN [facility] SERVICES [address] [place], [place], [address] Phone: [phone]   Fax:  [phone]   Name: Monica Collins MRN: [id number] Date of Birth: December 25, 1945

## 2017-01-06 ENCOUNTER — Ambulatory Visit: Payer: Medicare Other | Admitting: Occupational Therapy

## 2017-01-06 ENCOUNTER — Ambulatory Visit: Payer: Medicare Other | Admitting: Speech Pathology

## 2017-01-06 ENCOUNTER — Ambulatory Visit: Payer: Medicare Other

## 2017-01-06 DIAGNOSIS — R41841 Cognitive communication deficit: Secondary | ICD-10-CM | POA: Diagnosis not present

## 2017-01-06 DIAGNOSIS — I671 Cerebral aneurysm, nonruptured: Secondary | ICD-10-CM | POA: Diagnosis not present

## 2017-01-06 DIAGNOSIS — G8194 Hemiplegia, unspecified affecting left nondominant side: Secondary | ICD-10-CM | POA: Diagnosis not present

## 2017-01-07 NOTE — Therapy (Signed)
[place] MAIN [facility] SERVICES [address] [place], [place], [address] Phone: [phone]   Fax:  [phone]  Speech Language Pathology Treatment  Patient Details  Name: Monica Collins MRN: [id number] Date of Birth: 1945-08-30 Referring Provider: Dr. Dina Rich  Encounter Date: 01/06/2017      End of Session - 01/07/17 1044    Visit Number 12   Number of Visits 17   Date for SLP Re-Evaluation 01/21/17   SLP Start Time 1600   SLP Stop Time  1655   SLP Time Calculation (min) 55 min   Activity Tolerance Patient tolerated treatment well      Past Medical History:  Diagnosis Date  . Hypertension     Past Surgical History:  Procedure Laterality Date  . CHOLECYSTECTOMY      There were no vitals filed for this visit.      Subjective Assessment - 01/07/17 1043    Subjective "I'm doing pretty well!"   Currently in Pain? No/denies               ADULT SLP TREATMENT - 01/07/17 0001      General Information   Behavior/Cognition Alert;Cooperative;Pleasant mood   HPI Patient is a 71 year old woman who suffered a right MCA aneurysm; right Craniectomy and Clip Ligation of aneurysm performed on 10/23/16. Patient's neurological state deteriorated on 10/29/16, and head CT showed edema around aneurysm clip site. She spontaneously improved to her baseline that same day. On 10/31/16, patient's family noticed patient was having seizure-like activity with pill-rolling hand movements. Patient was monitored on an EEG and no electrographic seizures were noted over 48 hours of continuous monitoring. She was evaluated by SLP for swallowing and is on a regular diet with thin liquids as of 11/04/16. SLP noted that patient has low vocal intensity. Patient was [facility] for oral mech exam, orientation, and memory evaluations but demonstrated deficits in problem solving, safety/judgement, visual perception (left neglect), comprehension, and pragmatics/prosody. SLP  recommended intensive speech therapy. 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 Patient's prior level of functioning was [facility] before the aneurysm.       Treatment Provided   Treatment provided Cognitive-Linquistic     Pain Assessment   Pain Assessment No/denies pain     Cognitive-Linquistic Treatment   Treatment focused on Cognition;Patient/family/caregiver education   Skilled Treatment LANGUAGE/COGNITIVE SKILLS: Complete a variety of "[employer]" activities (word characteristics, calendar dates/process of elimination, lengthy scrambled sentences) with overall 80% accuracy given SLP cues to follow instructions consistently, cues to attend to details, and cues for reasoning.     Assessment / Recommendations / Plan   Plan Continue with current plan of care     Progression Toward Goals   Progression toward goals Progressing toward goals          SLP Education - 01/07/17 1044    Education provided Yes   Education Details recall/apply instructions consistently   Person(s) Educated Patient   Methods Explanation   Comprehension Verbalized understanding            SLP Long Term Goals - 12/26/16 1530      SLP LONG TERM GOAL #1   Title Patient will identify cognitive barriers and participate in developing functional compensatory strategies.   Time 8   Period Weeks   Status Partially Met     SLP LONG TERM GOAL #2   Title Patient will demonstrate functional cognitive-communication skills  for independent completion of personal responsibilities.    Time 8   Period Weeks   Status Partially Met     SLP LONG TERM GOAL #3   Title Patient will complete complex visual-spatial activities with 80% accuracy.   Time 8   Period Weeks   Status Partially Met     SLP LONG TERM GOAL #4   Title Patient will complete attention, executive function skills, and memory strategy activities with 80% accuracy.   Time 8   Period  Weeks   Status Partially Met          Plan - 01/07/17 1045    Clinical Impression Statement The patient is demonstrating improving attention and visual processing skills.  She demonstrates increased initiation of speech and a brighter affect.  She demonstrates improved processing speed.  She is able to read directions for tasks and follow through.  She responds to feedback and can improve performance when cued.  Patient reports applying problem solving strategy discussed last week to functional problems at home with good success.   Speech Therapy Frequency 2x / week   Duration Other (comment)   Treatment/Interventions Compensatory techniques;Patient/family education;Compensatory strategies;SLP instruction and feedback;Functional tasks   Potential to Achieve Goals Good   Potential Considerations Ability to learn/carryover information;Cooperation/participation level;Family/community support   SLP Home Exercise Plan scrambled sentences   Consulted and Agree with Plan of Care Patient      Patient will benefit from skilled therapeutic intervention in order to improve the following deficits and impairments:   Cognitive communication deficit    Problem List Patient Active Problem List   Diagnosis Date Noted  . Primary adenocarcinoma of lower lobe of left lung ([place]) 09/04/2016   [name], MS/CCC- SLP  [name] 01/07/2017, 10:46 AM  [place] MAIN [facility] SERVICES [address] [place], [place], [address] Phone: [phone]   Fax:  [phone]   Name: Monica Collins MRN: [id number] Date of Birth: [birth year]

## 2017-01-09 ENCOUNTER — Ambulatory Visit: Payer: Medicare Other

## 2017-01-09 ENCOUNTER — Ambulatory Visit: Payer: Medicare Other | Admitting: Speech Pathology

## 2017-01-09 ENCOUNTER — Ambulatory Visit: Payer: Medicare Other | Admitting: Occupational Therapy

## 2017-01-13 ENCOUNTER — Ambulatory Visit: Payer: Medicare Other | Admitting: Speech Pathology

## 2017-01-13 ENCOUNTER — Encounter: Payer: Medicare Other | Admitting: Occupational Therapy

## 2017-01-13 ENCOUNTER — Ambulatory Visit: Payer: Medicare Other

## 2017-01-13 DIAGNOSIS — R41841 Cognitive communication deficit: Secondary | ICD-10-CM

## 2017-01-14 NOTE — Therapy (Signed)
[place] MAIN [facility] SERVICES [address] [place], [place], [address] Phone: [phone]   Fax:  [phone]  Speech Language Pathology Treatment  Patient Details  Name: Monica Collins MRN: [id number] Date of Birth: 23-Jun-1945 Referring Provider: Dr. Dina Rich  Encounter Date: 01/13/2017      End of Session - 01/14/17 0958    Visit Number 13   Number of Visits 17   Date for SLP Re-Evaluation 01/21/17   SLP Start Time 1400   SLP Stop Time  1458   SLP Time Calculation (min) 58 min   Activity Tolerance Patient tolerated treatment well      Past Medical History:  Diagnosis Date  . Hypertension     Past Surgical History:  Procedure Laterality Date  . CHOLECYSTECTOMY      There were no vitals filed for this visit.      Subjective Assessment - 01/14/17 0955    Subjective Patient  reports she is having moments of "confusion", which appears to be decreased auditory attention / processing while tlking with her husbnd.   Currently in Pain? No/denies               ADULT SLP TREATMENT - 01/14/17 0001      General Information   Behavior/Cognition Alert;Cooperative;Pleasant mood   HPI Patient is a 71 year old woman who suffered a right MCA aneurysm; right Craniectomy and Clip Ligation of aneurysm performed on 10/23/16. Patient's neurological state deteriorated on 10/29/16, and head CT showed edema around aneurysm clip site. She spontaneously improved to her baseline that same day. On 10/31/16, patient's family noticed patient was having seizure-like activity with pill-rolling hand movements. Patient was monitored on an EEG and no electrographic seizures were noted over 48 hours of continuous monitoring. She was evaluated by SLP for swallowing and is on a regular diet with thin liquids as of 11/04/16. SLP noted that patient has low vocal intensity. Patient was [facility] for oral mech exam, orientation, and memory evaluations but demonstrated deficits  in problem solving, safety/judgement, visual perception (left neglect), comprehension, and pragmatics/prosody. SLP recommended intensive speech therapy. 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 Patient's prior level of functioning was [facility] before the aneurysm.       Treatment Provided   Treatment provided Cognitive-Linquistic     Pain Assessment   Pain Assessment No/denies pain     Cognitive-Linquistic Treatment   Treatment focused on Cognition;Patient/family/caregiver education   Skilled Treatment VISUAL PROCESSING: Complete moderately complex visual processing activity with 90% accuracy in 7 minutes 15 seconds.  LANGUAGE/COGNITIVE SKILLS: Follow complex written directions with 75% accuracy given min SLP cues.  Complete simple Perplexor puzzle with min SLP cues (consistent application of strategies and cues to reason intent of clue).     Assessment / Recommendations / Plan   Plan Continue with current plan of care     Progression Toward Goals   Progression toward goals Progressing toward goals          SLP Education - 01/14/17 0957    Education provided Yes   Education Details recall/apply instructions consistently, simplify complex visual information   Person(s) Educated Patient   Methods Explanation   Comprehension Verbalized understanding            SLP Long Term Goals - 12/26/16 1530      SLP LONG TERM GOAL #1   Title Patient will identify cognitive barriers and participate in  developing functional compensatory strategies.   Time 8   Period Weeks   Status Partially Met     SLP LONG TERM GOAL #2   Title Patient will demonstrate functional cognitive-communication skills for independent completion of personal responsibilities.    Time 8   Period Weeks   Status Partially Met     SLP LONG TERM GOAL #3   Title Patient will complete complex visual-spatial activities with 80% accuracy.   Time 8   Period  Weeks   Status Partially Met     SLP LONG TERM GOAL #4   Title Patient will complete attention, executive function skills, and memory strategy activities with 80% accuracy.   Time 8   Period Weeks   Status Partially Met          Plan - 01/14/17 0958    Clinical Impression Statement The patient is demonstrating improving attention and visual processing skills.  She demonstrates increased initiation of speech and a brighter affect.  She demonstrates improved processing speed.  She is able to read directions for tasks and follow through.  She responds to feedback and can improve performance when cued.  Patient reports applying problem solving strategy discussed last week to functional problems at home with good success.   Speech Therapy Frequency 2x / week   Duration Other (comment)   Treatment/Interventions Compensatory techniques;Patient/family education;Compensatory strategies;SLP instruction and feedback;Functional tasks   Potential to Achieve Goals Good   Potential Considerations Ability to learn/carryover information;Cooperation/participation level;Family/community support   SLP Home Exercise Plan visual attention task   Consulted and Agree with Plan of Care Patient      Patient will benefit from skilled therapeutic intervention in order to improve the following deficits and impairments:   Cognitive communication deficit    Problem List Patient Active Problem List   Diagnosis Date Noted  . Primary adenocarcinoma of lower lobe of left lung ([place]) 09/04/2016   [name], MS/CCC- SLP  Monica Collins 01/14/2017, 9:59 AM  [place] MAIN [facility] SERVICES [address] [place], [place], [address] Phone: [phone]   Fax:  [phone]   Name: Monica Collins MRN: [id number] Date of Birth: 07/10/1945

## 2017-01-15 ENCOUNTER — Ambulatory Visit: Payer: Medicare Other

## 2017-01-15 ENCOUNTER — Encounter: Payer: Medicare Other | Admitting: Occupational Therapy

## 2017-01-15 ENCOUNTER — Ambulatory Visit: Payer: Medicare Other | Admitting: Speech Pathology

## 2017-01-16 DIAGNOSIS — C3432 Malignant neoplasm of lower lobe, left bronchus or lung: Secondary | ICD-10-CM | POA: Diagnosis not present

## 2017-01-21 ENCOUNTER — Encounter: Payer: Medicare Other | Admitting: Occupational Therapy

## 2017-01-21 ENCOUNTER — Ambulatory Visit: Payer: Medicare Other | Admitting: Speech Pathology

## 2017-01-21 ENCOUNTER — Ambulatory Visit: Payer: Medicare Other

## 2017-01-21 DIAGNOSIS — R41841 Cognitive communication deficit: Secondary | ICD-10-CM

## 2017-01-22 ENCOUNTER — Encounter: Payer: Self-pay | Admitting: Speech Pathology

## 2017-01-22 DIAGNOSIS — Z902 Acquired absence of lung [part of]: Secondary | ICD-10-CM | POA: Diagnosis not present

## 2017-01-22 DIAGNOSIS — R918 Other nonspecific abnormal finding of lung field: Secondary | ICD-10-CM | POA: Diagnosis not present

## 2017-01-22 DIAGNOSIS — C3490 Malignant neoplasm of unspecified part of unspecified bronchus or lung: Secondary | ICD-10-CM | POA: Diagnosis not present

## 2017-01-22 NOTE — Therapy (Signed)
[place] MAIN [facility] SERVICES [address] [place], [place], [address] Phone: [phone]   Fax:  [phone]  Speech Language Pathology Treatment/Re-Certification  Patient Details  Name: Monica Collins MRN: [id number] Date of Birth: Jun 26, 1945 Referring Provider: Dr. Dina Rich  Encounter Date: 01/21/2017      End of Session - 01/22/17 1412    Visit Number 14   Number of Visits 33   Date for SLP Re-Evaluation 03/23/17   SLP Start Time 1400   SLP Stop Time  1455   SLP Time Calculation (min) 55 min   Activity Tolerance Patient tolerated treatment well      Past Medical History:  Diagnosis Date  . Hypertension     Past Surgical History:  Procedure Laterality Date  . CHOLECYSTECTOMY      There were no vitals filed for this visit.      Subjective Assessment - 01/22/17 1412    Subjective Patient  reports she is having moments of "confusion", which appears to be decreased auditory attention / processing while talking with her husbnd.   Currently in Pain? No/denies               ADULT SLP TREATMENT - 01/22/17 0001      General Information   Behavior/Cognition Alert;Cooperative;Pleasant mood   HPI Patient is a 71 year old woman who suffered a right MCA aneurysm; right Craniectomy and Clip Ligation of aneurysm performed on 10/23/16. Patient's neurological state deteriorated on 10/29/16, and head CT showed edema around aneurysm clip site. She spontaneously improved to her baseline that same day. On 10/31/16, patient's family noticed patient was having seizure-like activity with pill-rolling hand movements. Patient was monitored on an EEG and no electrographic seizures were noted over 48 hours of continuous monitoring. She was evaluated by SLP for swallowing and is on a regular diet with thin liquids as of 11/04/16. SLP noted that patient has low vocal intensity. Patient was [facility] for oral mech exam, orientation, and memory evaluations but  demonstrated deficits in problem solving, safety/judgement, visual perception (left neglect), comprehension, and pragmatics/prosody. SLP recommended intensive speech therapy. 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 Patient's prior level of functioning was [facility] before the aneurysm.       Treatment Provided   Treatment provided Cognitive-Linquistic     Pain Assessment   Pain Assessment No/denies pain     Cognitive-Linquistic Treatment   Treatment focused on Cognition;Patient/family/caregiver education   Skilled Treatment VISUAL PROCESSING: Complete moderately complex visual processing activity with 80% accuracy in 7 minutes 15 seconds.  LANGUAGE/COGNITIVE SKILLS: Follow complex written directions with 75% accuracy given min SLP cues.  Complete simple Perplexor puzzle with min SLP cues (consistent application of strategies and cues to reason intent of clue).     Assessment / Recommendations / Plan   Plan Continue with current plan of care     Progression Toward Goals   Progression toward goals Progressing toward goals          SLP Education - 01/22/17 1412    Education provided Yes   Education Details importance of vigilance   Person(s) Educated Patient   Methods Explanation   Comprehension Verbalized understanding            SLP Long Term Goals - 01/22/17 1414      SLP LONG TERM GOAL #1   Title Patient will identify cognitive barriers and participate in developing functional compensatory strategies.  Status Achieved     SLP LONG TERM GOAL #2   Title Patient will demonstrate functional cognitive-communication skills for independent completion of personal responsibilities.    Status Achieved     SLP LONG TERM GOAL #3   Title Patient will complete complex visual-spatial activities with 80% accuracy.   Time 8   Period Weeks   Status Partially Met   Target Date 03/24/17     SLP LONG TERM GOAL #4   Title  Patient will complete attention, executive function skills, and memory strategy activities with 80% accuracy.   Time 8   Period Weeks   Status Partially Met   Target Date 03/24/17          Plan - 01/22/17 1414    Clinical Impression Statement The patient is demonstrating improving attention and visual processing skills.  She demonstrates increased initiation of speech and a brighter affect.  She demonstrates improved processing speed.  She is able to read directions for tasks and follow through.  She responds to feedback and can improve performance when cued.  She continues to exhibit reduced vigilance and will benefit from continued SLP to focus on vigilance and accuracy in visual and auditory processing information.     Speech Therapy Frequency 2x / week   Duration Other (comment)   Treatment/Interventions Compensatory techniques;Patient/family education;Compensatory strategies;SLP instruction and feedback;Functional tasks   Potential to Achieve Goals Good   Potential Considerations Ability to learn/carryover information;Cooperation/participation level;Family/community support   Consulted and Agree with Plan of Care Patient      Patient will benefit from skilled therapeutic intervention in order to improve the following deficits and impairments:   Cognitive communication deficit - Plan: SLP plan of care cert/re-cert    Problem List Patient Active Problem List   Diagnosis Date Noted  . Primary adenocarcinoma of lower lobe of left lung ([place]) 09/04/2016   [name], MS/CCC- SLP  [name] 01/22/2017, 2:21 PM  [place] MAIN [facility] SERVICES [address] [place], [place], [address] Phone: [phone]   Fax:  [phone]   Name: Monica Collins MRN: [id number] Date of Birth: 1946/02/16

## 2017-01-23 ENCOUNTER — Encounter: Payer: Medicare Other | Admitting: Occupational Therapy

## 2017-01-23 ENCOUNTER — Ambulatory Visit: Payer: Medicare Other

## 2017-01-24 DIAGNOSIS — J310 Chronic rhinitis: Secondary | ICD-10-CM | POA: Diagnosis not present

## 2017-01-24 DIAGNOSIS — J3489 Other specified disorders of nose and nasal sinuses: Secondary | ICD-10-CM | POA: Diagnosis not present

## 2017-01-27 ENCOUNTER — Ambulatory Visit: Payer: Medicare Other | Attending: Physical Medicine and Rehabilitation | Admitting: Speech Pathology

## 2017-01-27 DIAGNOSIS — R41841 Cognitive communication deficit: Secondary | ICD-10-CM

## 2017-01-28 ENCOUNTER — Encounter: Payer: Self-pay | Admitting: Speech Pathology

## 2017-01-28 NOTE — Therapy (Signed)
[place] MAIN [facility] SERVICES [address] [place], [place], [address] Phone: [phone]   Fax:  [phone]  Speech Language Pathology Treatment  Patient Details  Name: Monica Collins MRN: [id number] Date of Birth: 16-Dec-1945 Referring Provider: Dr. Dina Rich  Encounter Date: 01/27/2017      End of Session - 01/28/17 1232    Visit Number 15   Number of Visits 33   Date for SLP Re-Evaluation 03/23/17   SLP Start Time 1300   SLP Stop Time  1350   SLP Time Calculation (min) 50 min   Activity Tolerance Patient tolerated treatment well      Past Medical History:  Diagnosis Date  . Hypertension     Past Surgical History:  Procedure Laterality Date  . CHOLECYSTECTOMY      There were no vitals filed for this visit.      Subjective Assessment - 01/28/17 1231    Subjective Pt was pleasant and cooperative. Returned completed homework.    Currently in Pain? No/denies               ADULT SLP TREATMENT - 01/28/17 0001      General Information   Behavior/Cognition Alert;Cooperative;Pleasant mood   HPI Patient is a 71 year old woman who suffered a right MCA aneurysm; right Craniectomy and Clip Ligation of aneurysm performed on 10/23/16. Patient's neurological state deteriorated on 10/29/16, and head CT showed edema around aneurysm clip site. She spontaneously improved to her baseline that same day. On 10/31/16, patient's family noticed patient was having seizure-like activity with pill-rolling hand movements. Patient was monitored on an EEG and no electrographic seizures were noted over 48 hours of continuous monitoring. She was evaluated by SLP for swallowing and is on a regular diet with thin liquids as of 11/04/16. SLP noted that patient has low vocal intensity. Patient was [facility] for oral mech exam, orientation, and memory evaluations but demonstrated deficits in problem solving, safety/judgement, visual perception (left neglect),  comprehension, and pragmatics/prosody. SLP recommended intensive speech therapy. 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 Patient's prior level of functioning was [facility] before the aneurysm.       Treatment Provided   Treatment provided Cognitive-Linquistic     Pain Assessment   Pain Assessment No/denies pain     Cognitive-Linquistic Treatment   Treatment focused on Cognition;Patient/family/caregiver education   Skilled Treatment VISUAL PROCESSING: Complete moderately complex visual processing activity with 88% accuracy in 9 minutes 20 seconds.  Pt cued to use finger on left side of page to help with placement. LANGUAGE/COGNITIVE SKILLS: Follow complex written directions with 81% accuracy.  Complete right brain (thought organization task): organizing shapes and numbers - 94%; identifying opposites in a field of 5 w/100% acc.      Assessment / Recommendations / Plan   Plan Continue with current plan of care     Progression Toward Goals   Progression toward goals Progressing toward goals          SLP Education - 01/28/17 1231    Education provided Yes   Education Details Strategies for scanning    Person(s) Educated Patient   Methods Explanation   Comprehension Verbalized understanding            SLP Long Term Goals - 01/22/17 1414      SLP LONG TERM GOAL #1   Title Patient will identify cognitive barriers and participate in developing functional compensatory strategies.  Status Achieved     SLP LONG TERM GOAL #2   Title Patient will demonstrate functional cognitive-communication skills for independent completion of personal responsibilities.    Status Achieved     SLP LONG TERM GOAL #3   Title Patient will complete complex visual-spatial activities with 80% accuracy.   Time 8   Period Weeks   Status Partially Met   Target Date 03/24/17     SLP LONG TERM GOAL #4   Title Patient will complete attention,  executive function skills, and memory strategy activities with 80% accuracy.   Time 8   Period Weeks   Status Partially Met   Target Date 03/24/17          Plan - 01/28/17 1233    Clinical Impression Statement The pt is demonstrating improving attention and visual processing skills. Pt demonstrated improved vigilance today with use of strategies to aid in scanning all the way to the left hand side of paper. Pt reports she is noting her improved performance in treatment as well.    Speech Therapy Frequency 2x / week   Duration Other (comment)   Treatment/Interventions Compensatory techniques;Patient/family education;Compensatory strategies;SLP instruction and feedback;Functional tasks   Potential to Achieve Goals Good   Potential Considerations Ability to learn/carryover information;Cooperation/participation level;Family/community support   SLP Home Exercise Plan right brain tasks   Consulted and Agree with Plan of Care Patient      Patient will benefit from skilled therapeutic intervention in order to improve the following deficits and impairments:   Cognitive communication deficit    Problem List Patient Active Problem List   Diagnosis Date Noted  . Primary adenocarcinoma of lower lobe of left lung ([place]) 09/04/2016   Charlean Sanfilippo, [place], [place]  Speech-Language Pathologist   [place] 01/28/2017, 12:36 PM  [place] MAIN [facility] SERVICES [address] [place], [place], [address] Phone: [phone]   Fax:  [phone]   Name: Monica Collins MRN: [id number] Date of Birth: 1946/02/04

## 2017-01-31 DIAGNOSIS — F411 Generalized anxiety disorder: Secondary | ICD-10-CM | POA: Diagnosis not present

## 2017-01-31 DIAGNOSIS — N390 Urinary tract infection, site not specified: Secondary | ICD-10-CM | POA: Diagnosis not present

## 2017-01-31 DIAGNOSIS — F17210 Nicotine dependence, cigarettes, uncomplicated: Secondary | ICD-10-CM | POA: Diagnosis not present

## 2017-01-31 DIAGNOSIS — C3432 Malignant neoplasm of lower lobe, left bronchus or lung: Secondary | ICD-10-CM | POA: Diagnosis not present

## 2017-01-31 DIAGNOSIS — I10 Essential (primary) hypertension: Secondary | ICD-10-CM | POA: Diagnosis not present

## 2017-01-31 DIAGNOSIS — Z0001 Encounter for general adult medical examination with abnormal findings: Secondary | ICD-10-CM | POA: Diagnosis not present

## 2017-01-31 DIAGNOSIS — I671 Cerebral aneurysm, nonruptured: Secondary | ICD-10-CM | POA: Diagnosis not present

## 2017-02-07 ENCOUNTER — Ambulatory Visit: Payer: Medicare Other | Admitting: Speech Pathology

## 2017-02-07 DIAGNOSIS — J310 Chronic rhinitis: Secondary | ICD-10-CM | POA: Diagnosis not present

## 2017-02-07 DIAGNOSIS — J3489 Other specified disorders of nose and nasal sinuses: Secondary | ICD-10-CM | POA: Diagnosis not present

## 2017-02-14 ENCOUNTER — Encounter: Payer: Self-pay | Admitting: Speech Pathology

## 2017-02-14 ENCOUNTER — Ambulatory Visit: Payer: Medicare Other | Admitting: Speech Pathology

## 2017-02-14 DIAGNOSIS — R41841 Cognitive communication deficit: Secondary | ICD-10-CM

## 2017-02-14 NOTE — Therapy (Signed)
[place] MAIN [facility] SERVICES [address] [place], [place], [address] Phone: [phone]   Fax:  [phone]  Speech Language Pathology Treatment  Patient Details  Name: Monica Collins MRN: [id number] Date of Birth: October 08, 1945 Referring Provider: Dr. Dina Rich  Encounter Date: 02/14/2017      End of Session - 02/14/17 1610    Visit Number 16   Number of Visits 33   Date for SLP Re-Evaluation 03/23/17   SLP Start Time 42   SLP Stop Time  1600   SLP Time Calculation (min) 55 min   Activity Tolerance Patient tolerated treatment well      Past Medical History:  Diagnosis Date  . Hypertension     Past Surgical History:  Procedure Laterality Date  . CHOLECYSTECTOMY      There were no vitals filed for this visit.      Subjective Assessment - 02/14/17 1608    Subjective Patient reports overall improved cognitive function at home, but has at least one episode a week of "forgetting" how to do a well known routine   Currently in Pain? No/denies               ADULT SLP TREATMENT - 02/14/17 0001      General Information   Behavior/Cognition Alert;Cooperative;Pleasant mood   HPI Patient is a 71 year old woman who suffered a right MCA aneurysm; right Craniectomy and Clip Ligation of aneurysm performed on 10/23/16. Patient's neurological state deteriorated on 10/29/16, and head CT showed edema around aneurysm clip site. She spontaneously improved to her baseline that same day. On 10/31/16, patient's family noticed patient was having seizure-like activity with pill-rolling hand movements. Patient was monitored on an EEG and no electrographic seizures were noted over 48 hours of continuous monitoring. She was evaluated by SLP for swallowing and is on a regular diet with thin liquids as of 11/04/16. SLP noted that patient has low vocal intensity. Patient was [facility] for oral mech exam, orientation, and memory evaluations but demonstrated deficits in  problem solving, safety/judgement, visual perception (left neglect), comprehension, and pragmatics/prosody. SLP recommended intensive speech therapy. 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 Patient's prior level of functioning was [facility] before the aneurysm.       Treatment Provided   Treatment provided Cognitive-Linquistic     Pain Assessment   Pain Assessment No/denies pain     Cognitive-Linquistic Treatment   Treatment focused on Cognition;Patient/family/caregiver education   Skilled Treatment VISUAL PROCESSING: Complete moderately complex visual processing activity with 99% accuracy in 11 minutes.  LANGUAGE/COGNITIVE SKILLS: Follow complex written directions with 82% accuracy independently.  Complete simple organizing and categorizing information task with 100% accuracy, once task fully explained.     Assessment / Recommendations / Plan   Plan Continue with current plan of care     Progression Toward Goals   Progression toward goals Progressing toward goals          SLP Education - 02/14/17 1610    Education provided Yes   Education Details Vigilence   Person(s) Educated Patient   Methods Explanation   Comprehension Verbalized understanding            SLP Long Term Goals - 01/22/17 1414      SLP LONG TERM GOAL #1   Title Patient will identify cognitive barriers and participate in developing functional compensatory strategies.   Status Achieved     SLP LONG  TERM GOAL #2   Title Patient will demonstrate functional cognitive-communication skills for independent completion of personal responsibilities.    Status Achieved     SLP LONG TERM GOAL #3   Title Patient will complete complex visual-spatial activities with 80% accuracy.   Time 8   Period Weeks   Status Partially Met   Target Date 03/24/17     SLP LONG TERM GOAL #4   Title Patient will complete attention, executive function skills, and memory  strategy activities with 80% accuracy.   Time 8   Period Weeks   Status Partially Met   Target Date 03/24/17          Plan - 02/14/17 1610    Clinical Impression Statement The pt is demonstrating improving attention and visual processing skills. Pt demonstrated improved vigilance today with use of strategies to aid in scanning all the way to the left hand side of paper. Pt reports she is noting her improved performance at home as well.    Speech Therapy Frequency 2x / week   Duration Other (comment)   Treatment/Interventions Compensatory techniques;Patient/family education;Compensatory strategies;SLP instruction and feedback;Functional tasks   Potential to Achieve Goals Good   Potential Considerations Ability to learn/carryover information;Cooperation/participation level;Family/community support   SLP Home Exercise Plan right brain tasks   Consulted and Agree with Plan of Care Patient      Patient will benefit from skilled therapeutic intervention in order to improve the following deficits and impairments:   Cognitive communication deficit    Problem List Patient Active Problem List   Diagnosis Date Noted  . Primary adenocarcinoma of lower lobe of left lung ([place]) 09/04/2016   [name], MS/CCC- SLP  [name] 02/14/2017, 4:12 PM  [place] MAIN [facility] SERVICES [address] [place], [place], [address] Phone: [phone]   Fax:  [phone]   Name: ANDERSYN FRAGOSO MRN: [id number] Date of Birth: January 17, 1946

## 2017-02-17 DIAGNOSIS — Z902 Acquired absence of lung [part of]: Secondary | ICD-10-CM | POA: Diagnosis not present

## 2017-02-17 DIAGNOSIS — R569 Unspecified convulsions: Secondary | ICD-10-CM | POA: Diagnosis not present

## 2017-02-17 DIAGNOSIS — C3432 Malignant neoplasm of lower lobe, left bronchus or lung: Secondary | ICD-10-CM | POA: Diagnosis not present

## 2017-02-17 DIAGNOSIS — I10 Essential (primary) hypertension: Secondary | ICD-10-CM | POA: Diagnosis not present

## 2017-02-17 DIAGNOSIS — Z9049 Acquired absence of other specified parts of digestive tract: Secondary | ICD-10-CM | POA: Diagnosis not present

## 2017-02-17 DIAGNOSIS — I671 Cerebral aneurysm, nonruptured: Secondary | ICD-10-CM | POA: Diagnosis not present

## 2017-02-17 DIAGNOSIS — J3489 Other specified disorders of nose and nasal sinuses: Secondary | ICD-10-CM | POA: Diagnosis not present

## 2017-02-17 DIAGNOSIS — Z8673 Personal history of transient ischemic attack (TIA), and cerebral infarction without residual deficits: Secondary | ICD-10-CM | POA: Diagnosis not present

## 2017-02-17 DIAGNOSIS — F419 Anxiety disorder, unspecified: Secondary | ICD-10-CM | POA: Diagnosis not present

## 2017-02-17 DIAGNOSIS — J449 Chronic obstructive pulmonary disease, unspecified: Secondary | ICD-10-CM | POA: Diagnosis not present

## 2017-02-17 DIAGNOSIS — Z79899 Other long term (current) drug therapy: Secondary | ICD-10-CM | POA: Diagnosis not present

## 2017-02-20 ENCOUNTER — Ambulatory Visit: Payer: Medicare Other | Admitting: Speech Pathology

## 2017-02-20 DIAGNOSIS — R41841 Cognitive communication deficit: Secondary | ICD-10-CM | POA: Diagnosis not present

## 2017-02-21 ENCOUNTER — Encounter: Payer: Self-pay | Admitting: Speech Pathology

## 2017-02-21 NOTE — Therapy (Signed)
[place] MAIN [facility] SERVICES [address] [place], [place], [address] Phone: [phone]   Fax:  [phone]  Speech Language Pathology Treatment  Patient Details  Name: Monica Collins MRN: [id number] Date of Birth: 09-May-1945 Referring Provider: Dr. Dina Rich  Encounter Date: 02/20/2017      End of Session - 02/21/17 1030    Visit Number 17   Number of Visits 33   Date for SLP Re-Evaluation 03/23/17   SLP Start Time 1500   SLP Stop Time  1555   SLP Time Calculation (min) 55 min   Activity Tolerance Patient tolerated treatment well      Past Medical History:  Diagnosis Date  . Hypertension     Past Surgical History:  Procedure Laterality Date  . CHOLECYSTECTOMY      There were no vitals filed for this visit.      Subjective Assessment - 02/21/17 1028    Subjective Patient commented "I can't believe how far I've come, compared to where I was"    Currently in Pain? No/denies               ADULT SLP TREATMENT - 02/21/17 0001      General Information   Behavior/Cognition Alert;Cooperative;Pleasant mood   HPI Patient is a 71 year old woman who suffered a right MCA aneurysm; right Craniectomy and Clip Ligation of aneurysm performed on 10/23/16. Patient's neurological state deteriorated on 10/29/16, and head CT showed edema around aneurysm clip site. She spontaneously improved to her baseline that same day. On 10/31/16, patient's family noticed patient was having seizure-like activity with pill-rolling hand movements. Patient was monitored on an EEG and no electrographic seizures were noted over 48 hours of continuous monitoring. She was evaluated by SLP for swallowing and is on a regular diet with thin liquids as of 11/04/16. SLP noted that patient has low vocal intensity. Patient was [facility] for oral mech exam, orientation, and memory evaluations but demonstrated deficits in problem solving, safety/judgement, visual perception (left  neglect), comprehension, and pragmatics/prosody. SLP recommended intensive speech therapy. 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 Patient's prior level of functioning was [facility] before the aneurysm.       Treatment Provided   Treatment provided Cognitive-Linquistic     Pain Assessment   Pain Assessment No/denies pain     Cognitive-Linquistic Treatment   Treatment focused on Cognition;Patient/family/caregiver education   Skilled Treatment VISUAL PROCESSING: Complete moderately complex visual processing activity with 100% accuracy in 12 minutes. ATTENTION: Completed Brainwave Exercises 1-3 with 98% accuracy. Complete organizing and categorizing language task (and explain reasoning) with 91% accuracy     Assessment / Recommendations / Plan   Plan Continue with current plan of care     Progression Toward Goals   Progression toward goals Progressing toward goals          SLP Education - 02/21/17 1030    Education provided Yes   Education Details explain reasoning    Person(s) Educated Patient   Methods Explanation   Comprehension Verbalized understanding            SLP Long Term Goals - 01/22/17 1414      SLP LONG TERM GOAL #1   Title Patient will identify cognitive barriers and participate in developing functional compensatory strategies.   Status Achieved     SLP LONG TERM GOAL #2   Title Patient will demonstrate functional cognitive-communication skills for independent  completion of personal responsibilities.    Status Achieved     SLP LONG TERM GOAL #3   Title Patient will complete complex visual-spatial activities with 80% accuracy.   Time 8   Period Weeks   Status Partially Met   Target Date 03/24/17     SLP LONG TERM GOAL #4   Title Patient will complete attention, executive function skills, and memory strategy activities with 80% accuracy.   Time 8   Period Weeks   Status Partially Met   Target  Date 03/24/17          Plan - 02/21/17 1032    Clinical Impression Statement The patient is demonstrating improving attention and visual processing skills. Demonstrated good vigilance today-slow but accurate response on visual processing activity. Pt reports she notices improvement since the time of onset.    Speech Therapy Frequency 2x / week   Duration Other (comment)   Treatment/Interventions Compensatory techniques;Patient/family education;Compensatory strategies;SLP instruction and feedback;Functional tasks   Potential to Achieve Goals Good   Potential Considerations Ability to learn/carryover information;Cooperation/participation level;Family/community support   SLP Home Exercise Plan use strategies    Consulted and Agree with Plan of Care Patient      Patient will benefit from skilled therapeutic intervention in order to improve the following deficits and impairments:   Cognitive communication deficit    Problem List Patient Active Problem List   Diagnosis Date Noted  . Primary adenocarcinoma of lower lobe of left lung [facility]) 09/04/2016    [name] [place] 02/21/2017, 10:34 AM  [place] MAIN [facility] SERVICES [address] [place], [place], [address] Phone: [phone]   Fax:  [phone]   Name: Monica Collins MRN: [id number] Date of Birth: Apr 20, 1946

## 2017-02-28 ENCOUNTER — Ambulatory Visit: Payer: Medicare Other | Attending: Physical Medicine and Rehabilitation | Admitting: Speech Pathology

## 2017-02-28 ENCOUNTER — Encounter: Payer: Self-pay | Admitting: Speech Pathology

## 2017-02-28 DIAGNOSIS — R41841 Cognitive communication deficit: Secondary | ICD-10-CM

## 2017-02-28 NOTE — Therapy (Signed)
[place] MAIN [facility] SERVICES [address] [place], [place], [address] Phone: [phone]   Fax:  [phone]  Speech Language Pathology Treatment  Patient Details  Name: Monica Collins MRN: [id number] Date of Birth: August 25, 1945 Referring Provider: Dr. Dina Rich  Encounter Date: 02/28/2017      End of Session - 02/28/17 1509    Visit Number 18   Number of Visits 33   Date for SLP Re-Evaluation 03/23/17   SLP Start Time 4401   SLP Stop Time  1456   SLP Time Calculation (min) 54 min   Activity Tolerance Patient tolerated treatment well      Past Medical History:  Diagnosis Date  . Hypertension     Past Surgical History:  Procedure Laterality Date  . CHOLECYSTECTOMY      There were no vitals filed for this visit.      Subjective Assessment - 02/28/17 1508    Subjective Patient pleasant and willingly participated; joked "math was never my strong suit" during activity    Currently in Pain? No/denies               ADULT SLP TREATMENT - 02/28/17 0001      General Information   Behavior/Cognition Alert;Cooperative;Pleasant mood   HPI Patient is a 71 year old woman who suffered a right MCA aneurysm; right Craniectomy and Clip Ligation of aneurysm performed on 10/23/16. Patient's neurological state deteriorated on 10/29/16, and head CT showed edema around aneurysm clip site. She spontaneously improved to her baseline that same day. On 10/31/16, patient's family noticed patient was having seizure-like activity with pill-rolling hand movements. Patient was monitored on an EEG and no electrographic seizures were noted over 48 hours of continuous monitoring. She was evaluated by SLP for swallowing and is on a regular diet with thin liquids as of 11/04/16. SLP noted that patient has low vocal intensity. Patient was [facility] for oral mech exam, orientation, and memory evaluations but demonstrated deficits in problem solving, safety/judgement, visual  perception (left neglect), comprehension, and pragmatics/prosody. SLP recommended intensive speech therapy. 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 Patient's prior level of functioning was [facility] before the aneurysm.       Treatment Provided   Treatment provided Cognitive-Linquistic     Pain Assessment   Pain Assessment No/denies pain     Cognitive-Linquistic Treatment   Treatment focused on Cognition;Patient/family/caregiver education   Skilled Treatment VISUAL PROCESSING: Complete moderately complex visual processing activity with 95% accuracy in 3 minutes 55 seconds. Use of paper to aid in scanning.  ATTENTION: Completed Brainwave Exercises 1-3 with 95% accuracy-noted circling of other letters (besides b) on task 3 resulting in greater number circled than correct. ORGANIZING: Complete organizing shapes numbers and events task (money math) (and explain reasoning) with 89% accuracy-mild cueing needed.     Assessment / Recommendations / Plan   Plan Continue with current plan of care     Progression Toward Goals   Progression toward goals Progressing toward goals          SLP Education - 02/28/17 1509    Education provided Yes   Education Details explaining reasoning sometimes more difficult than actually completing a task   Person(s) Educated Patient   Methods Explanation   Comprehension Verbalized understanding            SLP Long Term Goals - 01/22/17 1414      SLP LONG TERM GOAL #1  Title Patient will identify cognitive barriers and participate in developing functional compensatory strategies.   Status Achieved     SLP LONG TERM GOAL #2   Title Patient will demonstrate functional cognitive-communication skills for independent completion of personal responsibilities.    Status Achieved     SLP LONG TERM GOAL #3   Title Patient will complete complex visual-spatial activities with 80% accuracy.   Time 8    Period Weeks   Status Partially Met   Target Date 03/24/17     SLP LONG TERM GOAL #4   Title Patient will complete attention, executive function skills, and memory strategy activities with 80% accuracy.   Time 8   Period Weeks   Status Partially Met   Target Date 03/24/17          Plan - 02/28/17 1510    Clinical Impression Statement The patient is demonstrating improving attention and visual processing skills. Demonstrated slightly less accurate responses today but still highly accurate overall. Pt reports she has always struggled with math, but did well on math-related task.    Speech Therapy Frequency 2x / week   Duration Other (comment)   Treatment/Interventions Compensatory techniques;Patient/family education;Compensatory strategies;SLP instruction and feedback;Functional tasks   Potential to Achieve Goals Good   Potential Considerations Ability to learn/carryover information;Cooperation/participation level;Family/community support   SLP Home Exercise Plan finish money worksheet   Consulted and Agree with Plan of Care Patient      Patient will benefit from skilled therapeutic intervention in order to improve the following deficits and impairments:   Cognitive communication deficit    Problem List Patient Active Problem List   Diagnosis Date Noted  . Primary adenocarcinoma of lower lobe of left lung ([place]) 09/04/2016    Monica Collins [place] 02/28/2017, 3:10 PM   This entire session was performed under direct supervision and direction of a licensed therapist/therapist assistant . I have personally read, edited and approve of the note as written.  Monica Collins, [place], [place] MAIN [facility] SERVICES [address] [place], [place], [address] Phone: [phone]   Fax:  [phone]   Name: Monica Collins MRN: [id number] Date of Birth: 25-Apr-1946

## 2017-03-05 ENCOUNTER — Ambulatory Visit: Payer: Medicare Other | Admitting: Speech Pathology

## 2017-03-05 DIAGNOSIS — R41841 Cognitive communication deficit: Secondary | ICD-10-CM | POA: Diagnosis not present

## 2017-03-06 ENCOUNTER — Encounter: Payer: Self-pay | Admitting: Speech Pathology

## 2017-03-06 ENCOUNTER — Other Ambulatory Visit: Payer: Self-pay

## 2017-03-06 NOTE — Therapy (Signed)
[place] MAIN [facility] SERVICES [address] [place], [place], [address] Phone: [phone]   Fax:  [phone]  Speech Language Pathology Treatment  Patient Details  Name: Monica Collins MRN: [id number] Date of Birth: 10-16-1945 Referring Provider: Dr. Dina Rich   Encounter Date: 03/05/2017  End of Session - 03/06/17 1610    Visit Number  19    Number of Visits  33    Date for SLP Re-Evaluation  03/23/17    SLP Start Time  6269    SLP Stop Time   1700    SLP Time Calculation (min)  55 min    Activity Tolerance  Patient tolerated treatment well       Past Medical History:  Diagnosis Date  . Hypertension     Past Surgical History:  Procedure Laterality Date  . CHOLECYSTECTOMY      There were no vitals filed for this visit.  Subjective Assessment - 03/06/17 1609    Subjective  Patient enjoys challenges    Currently in Pain?  No/denies            ADULT SLP TREATMENT - 03/06/17 0001      General Information   Behavior/Cognition  Alert;Cooperative;Pleasant mood    HPI  Patient is a 71 year old woman who suffered a right MCA aneurysm; right Craniectomy and Clip Ligation of aneurysm performed on 10/23/16. Patient's neurological state deteriorated on 10/29/16, and head CT showed edema around aneurysm clip site. She spontaneously improved to her baseline that same day. On 10/31/16, patient's family noticed patient was having seizure-like activity with pill-rolling hand movements. Patient was monitored on an EEG and no electrographic seizures were noted over 48 hours of continuous monitoring. She was evaluated by SLP for swallowing and is on a regular diet with thin liquids as of 11/04/16. SLP noted that patient has low vocal intensity. Patient was [facility] for oral mech exam, orientation, and memory evaluations but demonstrated deficits in problem solving, safety/judgement, visual perception (left neglect), comprehension, and pragmatics/prosody. SLP  recommended intensive speech therapy. 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 Patient's prior level of functioning was [facility] before the aneurysm.        Treatment Provided   Treatment provided  Cognitive-Linquistic      Pain Assessment   Pain Assessment  No/denies pain      Cognitive-Linquistic Treatment   Treatment focused on  Cognition;Patient/family/caregiver education    Skilled Treatment  IDENTIFY COGNITIVE BARRIERS:  Patient reports she has difficulty "remembering things".  With questions to clarify, she reports that she forgets little things people tell her.  She cited someone telling her about a TV show she would like, but she did not remember to watch.  Working with SLP, the patient came up with a plan to keep a small notebook to jot such ideas in and plan to act on (eg, leave herself a sticky note).  LANGUAGE/COGNITIVE SKILLS: Follow complex written directions with 82% accuracy independently and 100% accuracy given cues to decode direction and reminder of rules.      Assessment / Recommendations / Plan   Plan  Continue with current plan of care      Progression Toward Goals   Progression toward goals  Progressing toward goals       SLP Education - 03/06/17 1610    Education provided  Yes    Education Details  keep a small notebook to record things  to remember    Person(s) Educated  Patient    Methods  Explanation    Comprehension  Verbalized understanding         SLP Long Term Goals - 01/22/17 1414      SLP LONG TERM GOAL #1   Title  Patient will identify cognitive barriers and participate in developing functional compensatory strategies.    Status  Achieved      SLP LONG TERM GOAL #2   Title  Patient will demonstrate functional cognitive-communication skills for independent completion of personal responsibilities.     Status  Achieved      SLP LONG TERM GOAL #3   Title  Patient will complete complex  visual-spatial activities with 80% accuracy.    Time  8    Period  Weeks    Status  Partially Met    Target Date  03/24/17      SLP LONG TERM GOAL #4   Title  Patient will complete attention, executive function skills, and memory strategy activities with 80% accuracy.    Time  8    Period  Weeks    Status  Partially Met    Target Date  03/24/17       Plan - 03/06/17 1613    Clinical Impression Statement  The patient is demonstrating improving attention and processing skills.  She demonstrates improved processing speed.  She is able to read directions for tasks and follow through.  She responds to feedback and can improve performance when cued.  She continues to exhibit reduced vigilance and will benefit from continued SLP to focus on vigilance and accuracy in visual and auditory processing information.      Speech Therapy Frequency  2x / week    Duration  Other (comment)    Treatment/Interventions  Compensatory techniques;Patient/family education;Compensatory strategies;SLP instruction and feedback;Functional tasks    Potential to Achieve Goals  Good    Potential Considerations  Ability to learn/carryover information;Cooperation/participation level;Family/community support    SLP Home Exercise Plan  keep a small notebook for things to remember    Consulted and Agree with Plan of Care  Patient       Patient will benefit from skilled therapeutic intervention in order to improve the following deficits and impairments:   Cognitive communication deficit    Problem List Patient Active Problem List   Diagnosis Date Noted  . Primary adenocarcinoma of lower lobe of left lung ([place]) 09/04/2016   [name], MS/CCC- SLP  [name] 03/06/2017, 4:15 PM  [place] MAIN [facility] SERVICES [address] [place], [place], [address] Phone: [phone]   Fax:  [phone]   Name: Monica Collins MRN: [id number] Date of Birth: [birth year]

## 2017-03-12 ENCOUNTER — Ambulatory Visit: Payer: Medicare Other | Admitting: Speech Pathology

## 2017-03-12 DIAGNOSIS — R41841 Cognitive communication deficit: Secondary | ICD-10-CM

## 2017-03-13 ENCOUNTER — Encounter: Payer: Self-pay | Admitting: Speech Pathology

## 2017-03-13 ENCOUNTER — Other Ambulatory Visit: Payer: Self-pay

## 2017-03-13 NOTE — Therapy (Signed)
Monica Collins [facility] SERVICES [address] [place], [place], [address] Phone: [phone]   Fax:  [phone]  Speech Language Pathology Treatment/Progress Note  Patient Details  Name: Monica Collins MRN: [id number] Date of Birth: 1945-09-27 Referring Provider: Dr. Dina Rich   Encounter Date: 03/12/2017  End of Session - 03/13/17 0758    Visit Number  20    Number of Visits  33    Date for SLP Re-Evaluation  03/23/17    SLP Start Time  2774    SLP Stop Time   1700    SLP Time Calculation (min)  55 min    Activity Tolerance  Patient tolerated treatment well       Past Medical History:  Diagnosis Date  . Hypertension     Past Surgical History:  Procedure Laterality Date  . CHOLECYSTECTOMY      There were no vitals filed for this visit.  Subjective Assessment - 03/13/17 0757    Subjective  "I'm losing everything"    Currently in Pain?  No/denies            ADULT SLP TREATMENT - 03/13/17 0001      General Information   Behavior/Cognition  Alert;Cooperative;Pleasant mood    HPI  Patient is a 71 year old woman who suffered a right MCA aneurysm; right Craniectomy and Clip Ligation of aneurysm performed on 10/23/16. Patient's neurological state deteriorated on 10/29/16, and head CT showed edema around aneurysm clip site. She spontaneously improved to her baseline that same day. On 10/31/16, patient's family noticed patient was having seizure-like activity with pill-rolling hand movements. Patient was monitored on an EEG and no electrographic seizures were noted over 48 hours of continuous monitoring. She was evaluated by SLP for swallowing and is on a regular diet with thin liquids as of 11/04/16. SLP noted that patient has low vocal intensity. Patient was [facility] for oral mech exam, orientation, and memory evaluations but demonstrated deficits in problem solving, safety/judgement, visual perception (left neglect), comprehension, and  pragmatics/prosody. SLP recommended intensive speech therapy. 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 Patient's prior level of functioning was [facility] before the aneurysm.        Treatment Provided   Treatment provided  Cognitive-Linquistic      Pain Assessment   Pain Assessment  No/denies pain      Cognitive-Linquistic Treatment   Treatment focused on  Cognition;Patient/family/caregiver education    Skilled Treatment  IDENTIFY COGNITIVE BARRIERS:  Patient states that she is more forgetful.  "losing everything" She states she does not feel as alert and that she doesn't feel right- "out of focus"  Working with the SLP, the patient developed a plan to call her neurologist with these new symptoms and ask for advice.  LANGUAGE/COGNITIVE SKILLS: Follow complex written directions with 82% accuracy independently and 100% accuracy given cues to decode direction and reminder of rules.      Assessment / Recommendations / Plan   Plan  Continue with current plan of care      Progression Toward Goals   Progression toward goals  Progressing toward goals       SLP Education - 03/13/17 0757    Education provided  Yes    Education Details  keep an informal journal    Person(s) Educated  Patient    Methods  Explanation    Comprehension  Verbalized understanding  SLP Long Term Goals - 03-21-17 0802      SLP LONG TERM GOAL #3   Title  Patient will complete complex visual-spatial activities with 80% accuracy.    Status  Partially Met      SLP LONG TERM GOAL #4   Title  Patient will complete attention, executive function skills, and memory strategy activities with 80% accuracy.    Status  Partially Met       Plan - 03/21/17 0759    Clinical Impression Statement  The patient is improving in cognitive skills during therapeutic activities, however, today she is reporting the sense cognitive decline.  She is advised to call her  neurologist and seek advice.  The patient is demonstrating improving attention and processing skills.  She demonstrates improved processing speed.  She is able to read directions for tasks and follow through.  She responds to feedback and can improve performance when cued.  She continues to exhibit reduced vigilance and will benefit from continued SLP to focus on vigilance and accuracy in visual and auditory processing information.      Speech Therapy Frequency  2x / week    Duration  Other (comment)    Treatment/Interventions  Compensatory techniques;Patient/family education;Compensatory strategies;SLP instruction and feedback;Functional tasks    Potential to Achieve Goals  Good    Potential Considerations  Ability to learn/carryover information;Cooperation/participation level;Family/community support    SLP Home Exercise Plan  keep a journal, call neurologist    Consulted and Agree with Plan of Care  Patient       Patient will benefit from skilled therapeutic intervention in order to improve the following deficits and impairments:   Cognitive communication deficit  G-Codes - March 21, 2017 0803    Functional Assessment Tool Used  therapeutic tasks, clinical judgment    Functional Limitations  Memory    Memory Current Status (Y1856)  At least 20 percent but less than 40 percent impaired, limited or restricted    Memory Goal Status (D1497)  At least 1 percent but less than 20 percent impaired, limited or restricted       Problem List Patient Active Problem List   Diagnosis Date Noted  . Primary adenocarcinoma of lower lobe of left lung ([place]) 09/04/2016   [name], MS/CCC- SLP  [name] 21-Mar-2017, 8:03 AM  [place] [facility] SERVICES [address] [place], [place], [address] Phone: [phone]   Fax:  [phone]   Name: Monica Collins MRN: [id number] Date of Birth: 1946-01-12

## 2017-03-19 ENCOUNTER — Ambulatory Visit: Payer: Medicare Other | Admitting: Speech Pathology

## 2017-03-26 ENCOUNTER — Other Ambulatory Visit: Payer: Self-pay

## 2017-03-26 ENCOUNTER — Encounter: Payer: Self-pay | Admitting: Speech Pathology

## 2017-03-26 ENCOUNTER — Ambulatory Visit: Payer: Medicare Other | Admitting: Speech Pathology

## 2017-03-26 DIAGNOSIS — R41841 Cognitive communication deficit: Secondary | ICD-10-CM | POA: Diagnosis not present

## 2017-03-26 NOTE — Therapy (Signed)
[place] [facility] SERVICES [address] [place], [place], [address] Phone: [phone]   Fax:  [phone]  Speech Language Pathology Treatment/Discharge Summary  Patient Details  Name: Monica Collins MRN: [id number] Date of Birth: 1945-10-30 Referring Provider: Dr. Dina Rich   Encounter Date: 03/26/2017  End of Session - 03/26/17 1723    Visit Number  21    Number of Visits  33    Date for SLP Re-Evaluation  03/26/17    SLP Start Time  2563    SLP Stop Time   1700    SLP Time Calculation (min)  47 min    Activity Tolerance  Patient tolerated treatment well       Past Medical History:  Diagnosis Date  . Hypertension     Past Surgical History:  Procedure Laterality Date  . CHOLECYSTECTOMY      There were no vitals filed for this visit.  Subjective Assessment - 03/26/17 1722    Subjective  "I feel much better"    Currently in Pain?  No/denies            ADULT SLP TREATMENT - 03/26/17 0001      General Information   Behavior/Cognition  Alert;Cooperative;Pleasant mood    HPI  Patient is a 71 year old woman who suffered a right MCA aneurysm; right Craniectomy and Clip Ligation of aneurysm performed on 10/23/16. Patient's neurological state deteriorated on 10/29/16, and head CT showed edema around aneurysm clip site. She spontaneously improved to her baseline that same day. On 10/31/16, patient's family noticed patient was having seizure-like activity with pill-rolling hand movements. Patient was monitored on an EEG and no electrographic seizures were noted over 48 hours of continuous monitoring. She was evaluated by SLP for swallowing and is on a regular diet with thin liquids as of 11/04/16. SLP noted that patient has low vocal intensity. Patient was [facility] for oral mech exam, orientation, and memory evaluations but demonstrated deficits in problem solving, safety/judgement, visual perception (left neglect), comprehension, and  pragmatics/prosody. SLP recommended intensive speech therapy. 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 Patient's prior level of functioning was [facility] before the aneurysm.        Treatment Provided   Treatment provided  Cognitive-Linquistic      Pain Assessment   Pain Assessment  No/denies pain      Cognitive-Linquistic Treatment   Treatment focused on  Cognition;Patient/family/caregiver education    Skilled Treatment  IDENTIFY COGNITIVE BARRIERS:  The patient reports that she is feeling better- clearer.  The patient is   able to identify cognitive barriers to completing common activities and develop strategies.  She requires assistance at times, but her daughter is helpful in problem solving without taking over.   LANGUAGE/COGNITIVE SKILLS: Follow written directions after distractor with 80% accuracy independently.  Patient identifies rehearsal as helpful in recalling information.  The patient reports that she is able to independently complete her personal responsibilities.  She is engaging in a variety of activities provide a variety of cognitive stimulation.      Assessment / Recommendations / Plan   Plan  Continue with current plan of care      Progression Toward Goals   Progression toward goals  Progressing toward goals       SLP Education - 03/26/17 1722    Education provided  Yes    Education Details  Remember to problem solve: identify cognitive barriers,  brain storm solutions, and evaluate results.    Person(s) Educated  Patient    Methods  Explanation    Comprehension  Verbalized understanding         SLP Long Term Goals - 04-12-2017 1724      SLP LONG TERM GOAL #1   Title  Patient will identify cognitive barriers and participate in developing functional compensatory strategies.    Status  Achieved      SLP LONG TERM GOAL #2   Title  Patient will demonstrate functional cognitive-communication skills for  independent completion of personal responsibilities.     Status  Achieved      SLP LONG TERM GOAL #3   Title  Patient will complete complex visual-spatial activities with 80% accuracy.    Status  Achieved      SLP LONG TERM GOAL #4   Title  Patient will complete attention, executive function skills, and memory strategy activities with 80% accuracy.    Status  Achieved       Plan - 2017/04/12 1724    Clinical Impression Statement  The patient has met her goals and is ready for discharge.  She demonstrates improved cognitive skills and is able to independently complete personal responsibilities. She is able to participate in a variety of activities that provide cognitive stimulation.  She understands the principle of problem solving by identifying cognitive barriers, brain storming solutions, and evaluating results.    Speech Therapy Frequency  Other (comment) Discharge    Treatment/Interventions  Compensatory techniques;Patient/family education;Compensatory strategies;SLP instruction and feedback;Functional tasks    Potential to Achieve Goals  Good    Potential Considerations  Ability to learn/carryover information;Cooperation/participation level;Family/community support    Consulted and Agree with Plan of Care  Patient       Patient will benefit from skilled therapeutic intervention in order to improve the following deficits and impairments:   Cognitive communication deficit  G-Codes - April 12, 2017 1725    Functional Assessment Tool Used  therapeutic tasks, clinical judgment    Functional Limitations  Memory    Memory Current Status (J2878)  At least 20 percent but less than 40 percent impaired, limited or restricted    Memory Goal Status (M7672)  At least 1 percent but less than 20 percent impaired, limited or restricted    Memory Discharge Status (G9170)  At least 20 percent but less than 40 percent impaired, limited or restricted       Problem List Patient Active Problem List    Diagnosis Date Noted  . Primary adenocarcinoma of lower lobe of left lung ([place]) 09/04/2016   [name], MS/CCC- SLP  [name] 2017-04-12, 5:25 PM  [place] [facility] SERVICES [address] [place], [place], [address] Phone: [phone]   Fax:  [phone]   Name: BERDELLA BACOT MRN: [id number] Date of Birth: 07/04/1945

## 2017-04-04 DIAGNOSIS — J3489 Other specified disorders of nose and nasal sinuses: Secondary | ICD-10-CM | POA: Diagnosis not present

## 2017-04-04 DIAGNOSIS — J310 Chronic rhinitis: Secondary | ICD-10-CM | POA: Diagnosis not present

## 2017-04-04 DIAGNOSIS — C3432 Malignant neoplasm of lower lobe, left bronchus or lung: Secondary | ICD-10-CM | POA: Diagnosis not present

## 2017-04-11 ENCOUNTER — Other Ambulatory Visit: Payer: Self-pay

## 2017-04-11 DIAGNOSIS — F411 Generalized anxiety disorder: Secondary | ICD-10-CM

## 2017-04-11 MED ORDER — ALPRAZOLAM 0.5 MG PO TABS: 0.5000 mg | ORAL_TABLET | Freq: Three times a day (TID) | ORAL | 2 refills | Status: DC | PRN

## 2017-04-29 HISTORY — PX: LUNG CANCER SURGERY: SHX702

## 2017-05-04 ENCOUNTER — Other Ambulatory Visit: Payer: Self-pay | Admitting: Internal Medicine

## 2017-05-13 ENCOUNTER — Emergency Department
Admission: EM | Admit: 2017-05-13 | Discharge: 2017-05-14 | Disposition: A | Discharge status: Home / Self Care | Payer: Medicare Other | Attending: Emergency Medicine | Admitting: Emergency Medicine

## 2017-05-13 ENCOUNTER — Other Ambulatory Visit: Payer: Self-pay

## 2017-05-13 ENCOUNTER — Emergency Department: Payer: Medicare Other

## 2017-05-13 DIAGNOSIS — Z91040 Latex allergy status: Secondary | ICD-10-CM | POA: Insufficient documentation

## 2017-05-13 DIAGNOSIS — R4182 Altered mental status, unspecified: Secondary | ICD-10-CM | POA: Diagnosis not present

## 2017-05-13 DIAGNOSIS — Z87891 Personal history of nicotine dependence: Secondary | ICD-10-CM | POA: Insufficient documentation

## 2017-05-13 DIAGNOSIS — R29810 Facial weakness: Secondary | ICD-10-CM | POA: Diagnosis not present

## 2017-05-13 DIAGNOSIS — R569 Unspecified convulsions: Secondary | ICD-10-CM | POA: Insufficient documentation

## 2017-05-13 DIAGNOSIS — I10 Essential (primary) hypertension: Secondary | ICD-10-CM | POA: Diagnosis not present

## 2017-05-13 DIAGNOSIS — Z7982 Long term (current) use of aspirin: Secondary | ICD-10-CM | POA: Insufficient documentation

## 2017-05-13 DIAGNOSIS — Z85118 Personal history of other malignant neoplasm of bronchus and lung: Secondary | ICD-10-CM | POA: Insufficient documentation

## 2017-05-13 DIAGNOSIS — R410 Disorientation, unspecified: Secondary | ICD-10-CM | POA: Diagnosis not present

## 2017-05-13 DIAGNOSIS — Z9114 Patient's other noncompliance with medication regimen: Secondary | ICD-10-CM | POA: Insufficient documentation

## 2017-05-13 LAB — COMPREHENSIVE METABOLIC PANEL
ALBUMIN: 4.3 g/dL (ref 3.5–5.0)
ALK PHOS: 85 U/L (ref 38–126)
ALT: 26 U/L (ref 14–54)
ANION GAP: 9 (ref 5–15)
AST: 33 U/L (ref 15–41)
BUN: 11 mg/dL (ref 6–20)
CALCIUM: 9.3 mg/dL (ref 8.9–10.3)
CO2: 28 mmol/L (ref 22–32)
Chloride: 102 mmol/L (ref 101–111)
Creatinine, Ser: 0.77 mg/dL (ref 0.44–1.00)
GFR calc Af Amer: >60 mL/min (ref >60)
GFR calc non Af Amer: >60 mL/min (ref >60)
GLUCOSE: 97 mg/dL (ref 65–99)
Potassium: 4.3 mmol/L (ref 3.5–5.1)
SODIUM: 139 mmol/L (ref 135–145)
Total Bilirubin: 0.8 mg/dL (ref 0.3–1.2)
Total Protein: 7.1 g/dL (ref 6.5–8.1)

## 2017-05-13 LAB — CBC
HCT: 42 % (ref 35.0–47.0)
HEMOGLOBIN: 14.5 g/dL (ref 12.0–16.0)
MCH: 32.1 pg (ref 26.0–34.0)
MCHC: 34.6 g/dL (ref 32.0–36.0)
MCV: 92.7 fL (ref 80.0–100.0)
Platelets: 194 10*3/uL (ref 150–440)
RBC: 4.54 MIL/uL (ref 3.80–5.20)
RDW: 12.6 % (ref 11.5–14.5)
WBC: 7.4 10*3/uL (ref 3.6–11.0)

## 2017-05-13 LAB — TROPONIN I: Troponin I: <0.03 ng/mL (ref <0.03)

## 2017-05-13 MED ORDER — SODIUM CHLORIDE 0.9 % IV SOLN
1000.0000 mg | Freq: Once | INTRAVENOUS | Status: AC
  Administered 2017-05-14: 1000 mg via INTRAVENOUS
  Filled 2017-05-13 (×2): qty 10

## 2017-05-13 NOTE — ED Provider Notes (Signed)
[facility] Emergency Department Provider Note ___   First MD Initiated Contact with Patient 05/13/17 2307     (approximate)  I have reviewed the triage vital signs and the nursing notes.   HISTORY  Chief Complaint Altered Mental Status    HPI TERIANN LIVINGOOD is a 72 y.o. female with history of hypertension cerebral aneurysm status post open repair lung CA status post resection presents to the emergency department acute onset of left facial droop confusion slurred speech at approximately 5 PM tonight which was observed by the patient's granddaughter.  Patient has no recollection of the incident.  Patient family bedside states that the patient occasionally will lose awareness for a brief period of time followed by complete regaining of consciousness.  Patient does admit to being noncompliant with Keppra stating that she has been taking 1 tablet/day for the past few months and has not taking any Keppra at all for the past few days.  Patient denies any symptoms at present.   Past Medical History:  Diagnosis Date  . Hypertension     Patient Active Problem List   Diagnosis Date Noted  . Primary adenocarcinoma of lower lobe of left lung ([place]) 09/04/2016    Past Surgical History:  Procedure Laterality Date  . CHOLECYSTECTOMY      Prior to Admission medications   Medication Sig Start Date End Date Taking? Authorizing Provider  ALPRAZolam Duanne Moron) 0.5 MG tablet Take 1 tablet (0.5 mg total) by mouth 3 (three) times daily as needed for anxiety. 04/11/17   Ronnell Freshwater, NP  aspirin 81 MG chewable tablet Chew by mouth daily.    [provider]  atenolol (TENORMIN) 25 MG tablet take 1 TO 2 tablet by mouth once daily for blood pressure 05/06/17   Boscia, Heather E, NP  levETIRAcetam (KEPPRA) 750 MG tablet Take 750 mg by mouth 2 (two) times daily.    [provider]  Multiple Vitamin (MULTIVITAMIN) capsule Take 1 capsule by mouth daily.     [provider]    Allergies Copper-containing compounds; Shellfish allergy; and Latex  No family history on file.  Social History Social History   Tobacco Use  . Smoking status: Former Smoker    Packs/day: 1.00    Years: 50.00    Pack years: 50.00    Types: Cigarettes    Last attempt to quit: 10/21/2016    Years since quitting: 0.5  . Smokeless tobacco: Never Used  Substance Use Topics  . Alcohol use: No    Comment: once a year  . Drug use: Not on file    Review of Systems Constitutional: No fever/chills Eyes: No visual changes. ENT: No sore throat. Cardiovascular: Denies chest pain. Respiratory: Denies shortness of breath. Gastrointestinal: No abdominal pain.  No nausea, no vomiting.  No diarrhea.  No constipation. Genitourinary: Negative for dysuria. Musculoskeletal: Negative for neck pain.  Negative for back pain. Integumentary: Negative for rash. Neurological: Positive for left facial droop confusion loss of awareness (now resolved).  ____________________________________________   PHYSICAL EXAM:  VITAL SIGNS: ED Triage Vitals [05/13/17 1949]  Enc Vitals Group     BP (!) 145/60     Pulse Rate 64     Resp 20     Temp 97.6 F (36.4 C)     Temp Source Oral     SpO2 97 %     Weight 45.4 kg (100 lb)     Height 1.524 m (5')  Head Circumference      Peak Flow      Pain Score      Pain Loc      Pain Edu?      Excl. in [place]?     Constitutional: Alert and oriented. Well appearing and in no acute distress. Eyes: Conjunctivae are normal.  Head: Atraumatic. Mouth/Throat: Mucous membranes are moist.  Oropharynx non-erythematous. Neck: No stridor.   Cardiovascular: Normal rate, regular rhythm. Good peripheral circulation. Grossly normal heart sounds. Respiratory: Normal respiratory effort.  No retractions. Lungs CTAB. Gastrointestinal: Soft and nontender. No distention.  Musculoskeletal: No lower extremity tenderness nor edema. No gross deformities  of extremities. Neurologic:  Normal speech and language. No gross focal neurologic deficits are appreciated.  Skin:  Skin is warm, dry and intact. No rash noted. Psychiatric: Mood and affect are normal. Speech and behavior are normal.  ____________________________________________   LABS (all labs ordered are listed, but only abnormal results are displayed)  Labs Reviewed  CBC  COMPREHENSIVE METABOLIC PANEL  TROPONIN I   ____________________________________________  EKG  ED ECG REPORT I, Harris, the attending physician, personally viewed and interpreted this ECG.   Date: 05/14/2017  EKG Time: 7:52 PM  Rate: 55  Rhythm: Normal sinus rhythm  Axis: Normal  Intervals: Normal  ST&T Change: None  ____________________________________________  RADIOLOGY I, Garfield N Keelie Zemanek, personally viewed and evaluated these images (plain radiographs) as part of my medical decision making, as well as reviewing the written report by the radiologist.  Ct Head Wo Contrast  Result Date: 05/13/2017 CLINICAL DATA:  Pt in with co left sided facial paralysis, was confused per daughter. Symptoms started at 1700 symptoms have relieved, hx of aneurysm repair June 2018. Pt does not recall event, denies any pain or states she feels "normal". Pt did forget to take he keppra x 3 days EXAM: CT HEAD WITHOUT CONTRAST TECHNIQUE: Contiguous axial images were obtained from the base of the skull through the vertex without intravenous contrast. COMPARISON:  12/27/2014 FINDINGS: Brain: No evidence of acute infarction, hemorrhage, hydrocephalus, extra-axial collection or mass lesion/mass effect. Since the prior CT there has been a right frontotemporal craniotomy. Encephalomalacia is noted in the anterior right temporal lobe extending to the deep white matter of the right frontal lobe and adjacent insular cortex. Two adjacent aneurysm clips lie anterior to the right temporal lobe. Vascular: No hyperdense vessel or  unexpected calcification. Skull: Right frontotemporal craniotomy flap. No acute fracture. No skull lesion. Sinuses/Orbits: Visualized globes and orbits are unremarkable. Visualized sinuses and mastoid air cells are clear. Other: None IMPRESSION: 1. No acute intracranial abnormalities. 2. Status post right frontotemporal craniotomy for aneurysm clipping, with associated encephalomalacia. These findings are new from the prior CT. Electronically Signed   By: Lajean Manes M.D.   On: 05/13/2017 20:17    Procedures   ____________________________________________   INITIAL IMPRESSION / ASSESSMENT AND PLAN / ED COURSE  As part of my medical decision making, I reviewed the following data within the electronic MEDICAL RECORD [id number] year old female presented with above-stated history and physical exam concerning for possible TIA CVA versus seizure most likely secondary to medication noncompliance.  CT scan of the head revealed no acute abnormality MRI likewise revealed no acute abnormality.  Patient was given Keppra 1000 mg IV bolus.  Spoke with the patient and family at length regarding necessity of medication compliance at home.  Patient and family informed of signs and symptoms that would warrant immediate return to the emergency department.  FINAL CLINICAL IMPRESSION(S) / ED DIAGNOSES  Final diagnoses:  Seizure ([place])     MEDICATIONS GIVEN DURING THIS VISIT:  Medications - No data to display   ED Discharge Orders    None       Note:  This document was prepared using Dragon voice recognition software and may include unintentional dictation errors.    Gregor Hams, MD 05/14/17 [phone]

## 2017-05-13 NOTE — ED Notes (Addendum)
Pt ambulatory to room 4 without difficulty or distress noted, accomp by family

## 2017-05-13 NOTE — ED Notes (Signed)
No code stroke per Dr. Reita Cliche

## 2017-05-13 NOTE — ED Triage Notes (Signed)
Pt in with co left sided facial paralysis, was confused per daughter. Symptoms started at 1700 symptoms have relieved, hx of aneurysm repair June 2018. Pt does not recall event, denies any pain or states she feels "normal".  Pt did forget to take he keppra x 3 days.

## 2017-05-14 ENCOUNTER — Emergency Department: Payer: Medicare Other

## 2017-05-14 DIAGNOSIS — R410 Disorientation, unspecified: Secondary | ICD-10-CM | POA: Diagnosis not present

## 2017-05-14 DIAGNOSIS — R569 Unspecified convulsions: Secondary | ICD-10-CM | POA: Diagnosis not present

## 2017-05-14 MED ORDER — GADOBENATE DIMEGLUMINE 529 MG/ML IV SOLN
10.0000 mL | Freq: Once | INTRAVENOUS | Status: AC | PRN
  Administered 2017-05-14: 10 mL via INTRAVENOUS

## 2017-05-14 NOTE — ED Notes (Signed)
Patient transported to MRI 

## 2017-06-03 ENCOUNTER — Ambulatory Visit: Payer: Self-pay | Admitting: Nurse Practitioner

## 2017-07-03 DIAGNOSIS — C3432 Malignant neoplasm of lower lobe, left bronchus or lung: Secondary | ICD-10-CM | POA: Diagnosis not present

## 2017-07-03 DIAGNOSIS — R911 Solitary pulmonary nodule: Secondary | ICD-10-CM | POA: Diagnosis not present

## 2017-07-25 ENCOUNTER — Encounter: Payer: Self-pay | Admitting: Nurse Practitioner

## 2017-07-25 ENCOUNTER — Ambulatory Visit (INDEPENDENT_AMBULATORY_CARE_PROVIDER_SITE_OTHER): Payer: Medicare Other | Admitting: Nurse Practitioner

## 2017-07-25 VITALS — BP 124/54 | HR 53 | Resp 16 | Ht 60.0 in | Wt 99.4 lb

## 2017-07-25 DIAGNOSIS — C3490 Malignant neoplasm of unspecified part of unspecified bronchus or lung: Secondary | ICD-10-CM | POA: Diagnosis not present

## 2017-07-25 DIAGNOSIS — F411 Generalized anxiety disorder: Secondary | ICD-10-CM

## 2017-07-25 DIAGNOSIS — N390 Urinary tract infection, site not specified: Secondary | ICD-10-CM | POA: Diagnosis not present

## 2017-07-25 DIAGNOSIS — R300 Dysuria: Secondary | ICD-10-CM

## 2017-07-25 LAB — POCT URINALYSIS DIPSTICK
Bilirubin, UA: NEGATIVE
Glucose, UA: NEGATIVE
KETONES UA: NEGATIVE
NITRITE UA: NEGATIVE
PH UA: 7.5 (ref 5.0–8.0)
PROTEIN UA: NEGATIVE
RBC UA: NEGATIVE
Urobilinogen, UA: 0.2 E.U./dL

## 2017-07-25 MED ORDER — ALPRAZOLAM 0.5 MG PO TABS: 0.5000 mg | ORAL_TABLET | Freq: Three times a day (TID) | ORAL | 2 refills | Status: DC | PRN

## 2017-07-25 MED ORDER — CIPROFLOXACIN HCL 500 MG PO TABS: 500.0000 mg | ORAL_TABLET | Freq: Two times a day (BID) | ORAL | 0 refills | Status: DC

## 2017-07-25 MED ORDER — PHENAZOPYRIDINE HCL 200 MG PO TABS: 200.0000 mg | ORAL_TABLET | Freq: Three times a day (TID) | ORAL | 0 refills | Status: DC | PRN

## 2017-07-25 NOTE — Progress Notes (Signed)
[facility] [place], [place] [address]  Internal MEDICINE  Office Visit Note  Patient Name: Monica Collins  [id number]  [id number]  Date of Service: 08/20/2017   Pt is here for routine follow up.   Chief Complaint  Patient presents with  . Follow-up  . Dysuria    Dysuria   This is a new problem. The current episode started in the past 7 days. The problem occurs intermittently. The problem has been gradually improving. The quality of the pain is described as aching. The pain is at a severity of 2/10. The pain is mild. There has been no fever. There is no history of pyelonephritis. Associated symptoms include frequency and urgency. Pertinent negatives include no chills, nausea or vomiting. She has tried increased fluids for the symptoms. The treatment provided moderate relief.       Current Medication: Outpatient Encounter Medications as of 07/25/2017  Medication Sig  . ALPRAZolam (XANAX) 0.5 MG tablet Take 1 tablet (0.5 mg total) by mouth 3 (three) times daily as needed for anxiety.  Marland Kitchen aspirin 81 MG chewable tablet Chew by mouth daily.  Marland Kitchen atenolol (TENORMIN) 25 MG tablet take 1 TO 2 tablet by mouth once daily for blood pressure  . ciprofloxacin (CIPRO) 500 MG tablet Take 1 tablet (500 mg total) by mouth 2 (two) times daily.  . Multiple Vitamin (MULTIVITAMIN) capsule Take 1 capsule by mouth daily.  . phenazopyridine (PYRIDIUM) 200 MG tablet Take 1 tablet (200 mg total) by mouth 3 (three) times daily as needed for pain.  . [DISCONTINUED] ALPRAZolam (XANAX) 0.5 MG tablet Take 1 tablet (0.5 mg total) by mouth 3 (three) times daily as needed for anxiety.  . [DISCONTINUED] levETIRAcetam (KEPPRA) 750 MG tablet Take 750 mg by mouth 2 (two) times daily.   No facility-administered encounter medications on file as of 07/25/2017.     Surgical History: Past Surgical History:  Procedure Laterality Date  . CHOLECYSTECTOMY      Medical History: Past Medical  History:  Diagnosis Date  . Hypertension     Family History: Family History  Problem Relation Age of Onset  . Cancer Sister   . Cancer Brother     Social History   Socioeconomic History  . Marital status: Married    Spouse name: Not on file  . Number of children: Not on file  . Years of education: Not on file  . Highest education level: Not on file  Occupational History  . Not on file  Social Needs  . Financial resource strain: Not on file  . Food insecurity:    Worry: Not on file    Inability: Not on file  . Transportation needs:    Medical: Not on file    Non-medical: Not on file  Tobacco Use  . Smoking status: Former Smoker    Packs/day: 1.00    Years: 50.00    Pack years: 50.00    Types: Cigarettes    Last attempt to quit: 10/21/2016    Years since quitting: 0.8  . Smokeless tobacco: Never Used  Substance and Sexual Activity  . Alcohol use: No    Comment: once a year  . Drug use: Never  . Sexual activity: Not on file  Lifestyle  . Physical activity:    Days per week: Not on file    Minutes per session: Not on file  . Stress: Not on file  Relationships  . Social connections:    Talks on phone:  Not on file    Gets together: Not on file    Attends religious service: Not on file    Active member of club or organization: Not on file    Attends meetings of clubs or organizations: Not on file    Relationship status: Not on file  . Intimate partner violence:    Fear of current or ex partner: Not on file    Emotionally abused: Not on file    Physically abused: Not on file    Forced sexual activity: Not on file  Other Topics Concern  . Not on file  Social History Narrative  . Not on file      Review of Systems  Constitutional: Positive for fatigue. Negative for activity change, chills and unexpected weight change.  HENT: Negative for congestion, postnasal drip, rhinorrhea, sneezing and sore throat.   Eyes: Negative.  Negative for redness.   Respiratory: Negative for cough, chest tightness, shortness of breath and wheezing.   Cardiovascular: Negative for chest pain and palpitations.  Gastrointestinal: Negative for abdominal pain, constipation, diarrhea, nausea and vomiting.  Endocrine: Negative for cold intolerance, heat intolerance, polydipsia, polyphagia and polyuria.  Genitourinary: Positive for dysuria, frequency and urgency.  Musculoskeletal: Negative for arthralgias, back pain, joint swelling, myalgias and neck pain.  Skin: Negative for rash.  Allergic/Immunologic: Negative for environmental allergies.  Neurological: Positive for weakness and headaches. Negative for tremors and numbness.  Hematological: Negative for adenopathy. Does not bruise/bleed easily.  Psychiatric/Behavioral: Positive for sleep disturbance. Negative for behavioral problems (Depression) and suicidal ideas. The patient is nervous/anxious.     Today's Vitals   07/25/17 1520  BP: (!) 124/54  Pulse: (!) 53  Resp: 16  SpO2: 96%  Weight: 99 lb 6.4 oz (45.1 kg)  Height: 5' (1.524 m)    Physical Exam  Constitutional: She is oriented to person, place, and time. She appears well-developed and well-nourished. No distress.  HENT:  Head: Normocephalic and atraumatic.  Mouth/Throat: Oropharynx is clear and moist. No oropharyngeal exudate.  Eyes: Pupils are equal, round, and reactive to light. EOM are normal.  Neck: Normal range of motion. Neck supple. No JVD present. No tracheal deviation present. No thyromegaly present.  Cardiovascular: Normal rate, regular rhythm and normal heart sounds. Exam reveals no gallop and no friction rub.  No murmur heard. Pulmonary/Chest: Effort normal and breath sounds normal. No respiratory distress. She has no wheezes. She has no rales. She exhibits no tenderness.  Abdominal: Soft. Bowel sounds are normal. There is no tenderness.  Genitourinary:  Genitourinary Comments: Urine sample positive for trace WBC   Musculoskeletal: Normal range of motion.  Lymphadenopathy:    She has no cervical adenopathy.  Neurological: She is alert and oriented to person, place, and time. No cranial nerve deficit.  Patient is at neurological baseline.   Skin: Skin is warm and dry. She is not diaphoretic.  Psychiatric: She has a normal mood and affect. Her behavior is normal. Judgment and thought content normal.  Nursing note and vitals reviewed.   Assessment/Plan: 1. Dysuria - POCT Urinalysis Dipstick positive for trace WBC. Treat for infection. Pyridium as needed. Adjust meds as indicated based on culture and sensitivity results.  - phenazopyridine (PYRIDIUM) 200 MG tablet; Take 1 tablet (200 mg total) by mouth 3 (three) times daily as needed for pain.  Dispense: 10 tablet; Refill: 0 - CULTURE, URINE COMPREHENSIVE  2. Urinary tract infection without hematuria, site unspecified - ciprofloxacin (CIPRO) 500 MG tablet; Take 1 tablet (500 mg  total) by mouth 2 (two) times daily.  Dispense: 14 tablet; Refill: 0 - CULTURE, URINE COMPREHENSIVE  3. Anxiety, generalized OK to continue alprazolam 0.5mg  three times daily if needed for acute anxiety. New prescription provided today.  - ALPRAZolam (XANAX) 0.5 MG tablet; Take 1 tablet (0.5 mg total) by mouth 3 (three) times daily as needed for anxiety.  Dispense: 90 tablet; Refill: 2  4. Malignant neoplasm of lung, unspecified laterality, unspecified part of lung ([place]) Continue regular visits with oncology and pulmonology as scheduled  General Counseling: Earnestine verbalizes understanding of the findings of todays visit and agrees with plan of treatment. I have discussed any further diagnostic evaluation that may be needed or ordered today. We also reviewed her medications today. she has been encouraged to call the office with any questions or concerns that should arise related to todays visit.  This patient was seen by Leretha Pol, FNP- C in Collaboration with Dr Lavera Guise as a part of collaborative care agreement    Orders Placed This Encounter  Procedures  . CULTURE, URINE COMPREHENSIVE  . POCT Urinalysis Dipstick    Meds ordered this encounter  Medications  . ciprofloxacin (CIPRO) 500 MG tablet    Sig: Take 1 tablet (500 mg total) by mouth 2 (two) times daily.    Dispense:  14 tablet    Refill:  0    Order Specific Question:   Supervising Provider    Answer:   Lavera Guise [[id number]]  . phenazopyridine (PYRIDIUM) 200 MG tablet    Sig: Take 1 tablet (200 mg total) by mouth 3 (three) times daily as needed for pain.    Dispense:  10 tablet    Refill:  0    Order Specific Question:   Supervising Provider    Answer:   Lavera Guise [[id number]]  . ALPRAZolam (XANAX) 0.5 MG tablet    Sig: Take 1 tablet (0.5 mg total) by mouth 3 (three) times daily as needed for anxiety.    Dispense:  90 tablet    Refill:  2    Please add refills to current prescription.    Order Specific Question:   Supervising Provider    Answer:   Lavera Guise [[id number]]    Time spent: 98 Minutes     Dr Lavera Guise Internal medicine

## 2017-07-28 LAB — CULTURE, URINE COMPREHENSIVE

## 2017-08-04 ENCOUNTER — Other Ambulatory Visit: Payer: Self-pay

## 2017-08-04 MED ORDER — LEVETIRACETAM 750 MG PO TABS: 750.0000 mg | ORAL_TABLET | Freq: Two times a day (BID) | ORAL | 3 refills | Status: DC

## 2017-08-05 ENCOUNTER — Other Ambulatory Visit: Payer: Self-pay

## 2017-08-05 MED ORDER — LEVETIRACETAM ER 750 MG PO TB24: 750.0000 mg | ORAL_TABLET | Freq: Two times a day (BID) | ORAL | 5 refills | Status: DC

## 2017-08-06 ENCOUNTER — Other Ambulatory Visit: Payer: Self-pay | Admitting: Nurse Practitioner

## 2017-08-06 MED ORDER — LEVETIRACETAM ER 750 MG PO TB24: 750.0000 mg | ORAL_TABLET | Freq: Two times a day (BID) | ORAL | 5 refills | Status: DC

## 2017-08-20 DIAGNOSIS — R300 Dysuria: Secondary | ICD-10-CM | POA: Insufficient documentation

## 2017-08-20 DIAGNOSIS — N390 Urinary tract infection, site not specified: Secondary | ICD-10-CM | POA: Insufficient documentation

## 2017-08-20 DIAGNOSIS — C3490 Malignant neoplasm of unspecified part of unspecified bronchus or lung: Secondary | ICD-10-CM | POA: Insufficient documentation

## 2017-08-20 DIAGNOSIS — F411 Generalized anxiety disorder: Secondary | ICD-10-CM | POA: Insufficient documentation

## 2017-10-02 DIAGNOSIS — Z9889 Other specified postprocedural states: Secondary | ICD-10-CM | POA: Diagnosis not present

## 2017-10-02 DIAGNOSIS — J432 Centrilobular emphysema: Secondary | ICD-10-CM | POA: Diagnosis not present

## 2017-10-02 DIAGNOSIS — F17210 Nicotine dependence, cigarettes, uncomplicated: Secondary | ICD-10-CM | POA: Diagnosis not present

## 2017-10-02 DIAGNOSIS — C3432 Malignant neoplasm of lower lobe, left bronchus or lung: Secondary | ICD-10-CM | POA: Diagnosis not present

## 2017-10-27 ENCOUNTER — Ambulatory Visit: Payer: Self-pay | Admitting: Nurse Practitioner

## 2017-10-27 DIAGNOSIS — I729 Aneurysm of unspecified site: Secondary | ICD-10-CM

## 2017-10-27 HISTORY — DX: Aneurysm of unspecified site: I72.9

## 2017-11-05 ENCOUNTER — Telehealth: Payer: Self-pay

## 2017-11-05 ENCOUNTER — Other Ambulatory Visit: Payer: Self-pay | Admitting: Nurse Practitioner

## 2017-11-05 DIAGNOSIS — F411 Generalized anxiety disorder: Secondary | ICD-10-CM

## 2017-11-05 MED ORDER — ALPRAZOLAM 0.5 MG PO TABS: 0.5000 mg | ORAL_TABLET | Freq: Three times a day (TID) | ORAL | 2 refills | Status: DC | PRN

## 2017-11-05 NOTE — Telephone Encounter (Signed)
PT WAS NOTIFIED. 

## 2017-11-05 NOTE — Telephone Encounter (Signed)
Renewed patient's prescription for alprazolam 0.55mg  TID prn and sent [facility].

## 2017-11-05 NOTE — Progress Notes (Signed)
Renewed patient's prescription for alprazolam 0.55mg  TID prn and sent to [facility].

## 2017-11-07 ENCOUNTER — Other Ambulatory Visit: Payer: Self-pay | Admitting: Nurse Practitioner

## 2017-11-07 DIAGNOSIS — G40909 Epilepsy, unspecified, not intractable, without status epilepticus: Secondary | ICD-10-CM

## 2017-11-07 MED ORDER — LEVETIRACETAM 750 MG PO TABS: 750.0000 mg | ORAL_TABLET | Freq: Two times a day (BID) | ORAL | 1 refills | Status: DC

## 2017-11-07 NOTE — Progress Notes (Signed)
Updated prescription for levetiracetam to 750mg  BID. D/c ER750mg  dose per insurance request.

## 2017-11-07 NOTE — Telephone Encounter (Signed)
error 

## 2017-12-25 ENCOUNTER — Telehealth: Payer: Self-pay | Admitting: Nurse Practitioner

## 2017-12-25 ENCOUNTER — Other Ambulatory Visit: Payer: Self-pay | Admitting: Nurse Practitioner

## 2017-12-25 DIAGNOSIS — G40909 Epilepsy, unspecified, not intractable, without status epilepticus: Secondary | ICD-10-CM

## 2017-12-25 MED ORDER — LEVETIRACETAM 750 MG PO TABS: 750.0000 mg | ORAL_TABLET | Freq: Two times a day (BID) | ORAL | 0 refills | Status: DC

## 2017-12-25 NOTE — Telephone Encounter (Signed)
Ms Monica Collins eldest daughter called regarding her keppra , she is wanting you to call her if at all possible , she is wanting to know about weaning her off the keppra or is she needing to follow up with her neurosurgeon.  Thayer Ohm number is [phone]

## 2017-12-25 NOTE — Progress Notes (Signed)
Renewed keppra 750mg  bid and sent to [facility] in [place]. In order to wean off of this, she really should follow up with neurosurgeon.

## 2017-12-25 NOTE — Telephone Encounter (Signed)
Spoke with her daughter and she is scheduling her to come in sooner in regards to this

## 2017-12-25 NOTE — Telephone Encounter (Signed)
Renewed keppra 750mg  bid and sent to [facility] in [place]. In order to wean off of this, she really should follow up with neurosurgeon.

## 2018-01-01 ENCOUNTER — Ambulatory Visit (INDEPENDENT_AMBULATORY_CARE_PROVIDER_SITE_OTHER): Payer: Medicare Other | Admitting: Nurse Practitioner

## 2018-01-01 ENCOUNTER — Encounter: Payer: Self-pay | Admitting: Nurse Practitioner

## 2018-01-01 VITALS — BP 166/68 | HR 72 | Resp 16 | Ht 60.0 in | Wt 105.0 lb

## 2018-01-01 DIAGNOSIS — Z9889 Other specified postprocedural states: Secondary | ICD-10-CM

## 2018-01-01 DIAGNOSIS — I10 Essential (primary) hypertension: Secondary | ICD-10-CM

## 2018-01-01 DIAGNOSIS — F0630 Mood disorder due to known physiological condition, unspecified: Secondary | ICD-10-CM | POA: Diagnosis not present

## 2018-01-01 NOTE — Progress Notes (Signed)
[facility] [place], [place] [address]  Internal MEDICINE  Office Visit Note  Patient Name: Monica Collins  [id number]  [id number]  Date of Service: 01/07/2018  Chief Complaint  Patient presents with  . Hypertension    The patient states that she would like to come off of her keppra. Was prescribed this while in the hospital a year ago. She had suffered a cerebral aneurysm. There was question as to whether or not she had seizures after she had brain surgery. She was put on the keppra prophylactically. She was told that she should be on this for about a year and then could be taken off of it if she was seizure free, which she has been. She did contact the office of her neurosurgeon who initially put her on the keppra, to discuss coming off of it. Since she has not seen them in several months, they asked that she discuss this with me, her primary care provider.        Current Medication: Outpatient Encounter Medications as of 01/01/2018  Medication Sig  . ALPRAZolam (XANAX) 0.5 MG tablet Take 1 tablet (0.5 mg total) by mouth 3 (three) times daily as needed for anxiety.  Marland Kitchen aspirin 81 MG chewable tablet Chew by mouth daily.  Marland Kitchen atenolol (TENORMIN) 25 MG tablet take 1 TO 2 tablet by mouth once daily for blood pressure  . ciprofloxacin (CIPRO) 500 MG tablet Take 1 tablet (500 mg total) by mouth 2 (two) times daily.  Marland Kitchen levETIRAcetam (KEPPRA) 750 MG tablet Take 1 tablet (750 mg total) by mouth 2 (two) times daily.  . Multiple Vitamin (MULTIVITAMIN) capsule Take 1 capsule by mouth daily.  . phenazopyridine (PYRIDIUM) 200 MG tablet Take 1 tablet (200 mg total) by mouth 3 (three) times daily as needed for pain. (Patient not taking: Reported on 01/01/2018)   No facility-administered encounter medications on file as of 01/01/2018.     Surgical History: Past Surgical History:  Procedure Laterality Date  . CHOLECYSTECTOMY      Medical History: Past Medical History:   Diagnosis Date  . Hypertension     Family History: Family History  Problem Relation Age of Onset  . Cancer Sister   . Cancer Brother     Social History   Socioeconomic History  . Marital status: Married    Spouse name: Not on file  . Number of children: Not on file  . Years of education: Not on file  . Highest education level: Not on file  Occupational History  . Not on file  Social Needs  . Financial resource strain: Not on file  . Food insecurity:    Worry: Not on file    Inability: Not on file  . Transportation needs:    Medical: Not on file    Non-medical: Not on file  Tobacco Use  . Smoking status: Current Some Day Smoker    Packs/day: 12.00    Years: 50.00    Pack years: 600.00    Types: Cigarettes    Last attempt to quit: 10/21/2016    Years since quitting: 1.2  . Smokeless tobacco: Never Used  Substance and Sexual Activity  . Alcohol use: No    Comment: once a year  . Drug use: Never  . Sexual activity: Not on file  Lifestyle  . Physical activity:    Days per week: Not on file    Minutes per session: Not on file  . Stress: Not on file  Relationships  . Social connections:    Talks on phone: Not on file    Gets together: Not on file    Attends religious service: Not on file    Active member of club or organization: Not on file    Attends meetings of clubs or organizations: Not on file    Relationship status: Not on file  . Intimate partner violence:    Fear of current or ex partner: Not on file    Emotionally abused: Not on file    Physically abused: Not on file    Forced sexual activity: Not on file  Other Topics Concern  . Not on file  Social History Narrative  . Not on file      Review of Systems  Constitutional: Positive for fatigue. Negative for activity change, chills and unexpected weight change.  HENT: Positive for rhinorrhea. Negative for congestion, postnasal drip, sneezing and sore throat.   Eyes: Negative.  Negative for  redness.  Respiratory: Negative for cough, chest tightness, shortness of breath and wheezing.   Cardiovascular: Negative for chest pain and palpitations.  Gastrointestinal: Negative for abdominal pain, constipation, diarrhea, nausea and vomiting.  Endocrine: Negative for cold intolerance, heat intolerance, polydipsia, polyphagia and polyuria.  Genitourinary: Negative for dysuria, frequency and urgency.  Musculoskeletal: Negative for arthralgias, back pain, joint swelling, myalgias and neck pain.  Skin: Negative for rash.  Allergic/Immunologic: Negative for environmental allergies.  Neurological: Positive for dizziness, weakness and headaches. Negative for tremors and numbness.       Has noted some decline in her cognitive ability.   Hematological: Negative for adenopathy. Does not bruise/bleed easily.  Psychiatric/Behavioral: Positive for dysphoric mood and sleep disturbance. Negative for behavioral problems (Depression) and suicidal ideas. The patient is nervous/anxious.        Increase depression and crying frequently.     Today's Vitals   01/01/18 1515  BP: (!) 166/68  Pulse: 72  Resp: 16  SpO2: 97%  Weight: 105 lb (47.6 kg)  Height: 5' (1.524 m)    Physical Exam  Constitutional: She is oriented to person, place, and time. She appears well-developed and well-nourished. No distress.  HENT:  Head: Normocephalic and atraumatic.  Nose: Rhinorrhea present.  Mouth/Throat: Oropharynx is clear and moist. No oropharyngeal exudate.  Eyes: Pupils are equal, round, and reactive to light. Conjunctivae and EOM are normal.  Neck: Normal range of motion. Neck supple. No JVD present. No tracheal deviation present. No thyromegaly present.  Cardiovascular: Normal rate, regular rhythm and normal heart sounds. Exam reveals no gallop and no friction rub.  No murmur heard. Pulmonary/Chest: Effort normal and breath sounds normal. No respiratory distress. She has no wheezes. She has no rales. She  exhibits no tenderness.  Abdominal: Soft. Bowel sounds are normal. There is no tenderness.  Musculoskeletal: Normal range of motion.  Lymphadenopathy:    She has no cervical adenopathy.  Neurological: She is alert and oriented to person, place, and time. No cranial nerve deficit.  Patient is at neurological baseline.   Skin: Skin is warm and dry. She is not diaphoretic.  Psychiatric: Her speech is normal and behavior is normal. Judgment and thought content normal. Her mood appears anxious. Cognition and memory are normal. She exhibits a depressed mood.  The patient is weepy. She is not suicidal or homicidal.   Nursing note and vitals reviewed.  Assessment/Plan: 1. Essential hypertension Generally stable. Continue atenolol as prescribed.   2. History of surgery for cerebral aneurysm Currently on keppra  to prevent seizures after surgery. Refer to neurology for further evaluation and treatment and to discuss proper process to wean off keppra.  - Ambulatory referral to Neurology  3. 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 Patient has been referred to neurology for further evaluation and treatment.  - Ambulatory referral to Neurology  General Counseling: Leialoha verbalizes understanding of the findings of todays visit and agrees with plan of treatment. I have discussed any further diagnostic evaluation that may be needed or ordered today. We also reviewed her medications today. she has been encouraged to call the office with any questions or concerns that should arise related to todays visit.  This patient was seen by Leretha Pol FNP Collaboration with Dr Lavera Guise as a part of collaborative care agreement  Orders Placed This Encounter  Procedures  . Ambulatory referral to Neurology     Time spent: [place] [facility]

## 2018-01-07 DIAGNOSIS — Z9889 Other specified postprocedural states: Secondary | ICD-10-CM | POA: Insufficient documentation

## 2018-01-07 DIAGNOSIS — I10 Essential (primary) hypertension: Secondary | ICD-10-CM | POA: Insufficient documentation

## 2018-01-07 DIAGNOSIS — F0630 Mood disorder due to known physiological condition, unspecified: Secondary | ICD-10-CM | POA: Insufficient documentation

## 2018-01-22 ENCOUNTER — Other Ambulatory Visit: Payer: Self-pay | Admitting: Nurse Practitioner

## 2018-01-22 ENCOUNTER — Telehealth: Payer: Self-pay | Admitting: Nurse Practitioner

## 2018-01-22 DIAGNOSIS — G40909 Epilepsy, unspecified, not intractable, without status epilepticus: Secondary | ICD-10-CM

## 2018-01-22 MED ORDER — LEVETIRACETAM 750 MG PO TABS: 750.0000 mg | ORAL_TABLET | Freq: Two times a day (BID) | ORAL | 0 refills | Status: DC

## 2018-01-22 NOTE — Progress Notes (Signed)
Renewed keppra and sent to [facility] in [place].

## 2018-01-22 NOTE — Telephone Encounter (Signed)
I had sent in 90 day prescription for this at her visit 12/25/2017, not sure if it didn't go through. I resent this same prescription to [facility] [place]. It is 90 day supply.

## 2018-01-23 ENCOUNTER — Other Ambulatory Visit: Payer: Self-pay | Admitting: Nurse Practitioner

## 2018-01-23 DIAGNOSIS — G40909 Epilepsy, unspecified, not intractable, without status epilepticus: Secondary | ICD-10-CM

## 2018-01-23 MED ORDER — LEVETIRACETAM 750 MG PO TABS: 750.0000 mg | ORAL_TABLET | Freq: Two times a day (BID) | ORAL | 0 refills | Status: DC

## 2018-01-23 NOTE — Progress Notes (Signed)
Sent new prescription for [facility] to [facility] - sent 90 day prescription.

## 2018-01-27 ENCOUNTER — Other Ambulatory Visit: Payer: Self-pay

## 2018-01-27 DIAGNOSIS — F411 Generalized anxiety disorder: Secondary | ICD-10-CM

## 2018-01-27 MED ORDER — ALPRAZOLAM 0.5 MG PO TABS: 0.5000 mg | ORAL_TABLET | Freq: Three times a day (TID) | ORAL | 2 refills | Status: DC | PRN

## 2018-01-27 NOTE — Telephone Encounter (Signed)
Called in [facility] as per heather xanax 0.5 mg three times daily as needed 90 with 2 pt just had appt

## 2018-02-04 DIAGNOSIS — R569 Unspecified convulsions: Secondary | ICD-10-CM | POA: Diagnosis not present

## 2018-02-12 ENCOUNTER — Encounter: Payer: Self-pay | Admitting: Nurse Practitioner

## 2018-02-12 ENCOUNTER — Ambulatory Visit (INDEPENDENT_AMBULATORY_CARE_PROVIDER_SITE_OTHER): Payer: Medicare Other | Admitting: Nurse Practitioner

## 2018-02-12 ENCOUNTER — Other Ambulatory Visit: Payer: Self-pay | Admitting: Nurse Practitioner

## 2018-02-12 VITALS — BP 137/52 | HR 58 | Resp 16 | Ht 60.0 in | Wt 106.4 lb

## 2018-02-12 DIAGNOSIS — Z0001 Encounter for general adult medical examination with abnormal findings: Secondary | ICD-10-CM

## 2018-02-12 DIAGNOSIS — Z9889 Other specified postprocedural states: Secondary | ICD-10-CM | POA: Diagnosis not present

## 2018-02-12 DIAGNOSIS — Z1231 Encounter for screening mammogram for malignant neoplasm of breast: Secondary | ICD-10-CM

## 2018-02-12 DIAGNOSIS — I10 Essential (primary) hypertension: Secondary | ICD-10-CM

## 2018-02-12 DIAGNOSIS — M1990 Unspecified osteoarthritis, unspecified site: Secondary | ICD-10-CM

## 2018-02-12 DIAGNOSIS — Z1239 Encounter for other screening for malignant neoplasm of breast: Secondary | ICD-10-CM

## 2018-02-12 DIAGNOSIS — R300 Dysuria: Secondary | ICD-10-CM | POA: Diagnosis not present

## 2018-02-12 MED ORDER — DICLOFENAC SODIUM 1 % TD GEL: 4.0000 g | Freq: Four times a day (QID) | TRANSDERMAL | 3 refills | Status: DC

## 2018-02-12 NOTE — Progress Notes (Signed)
[facility] [place], [place] [address]  Internal MEDICINE  Office Visit Note  Patient Name: Monica Collins  [id number]  [id number]  Date of Service: 02/15/2018   Pt is here for routine health maintenance examination  Chief Complaint  Patient presents with  . Medicare Wellness    awv      The patient is now seeing neurologist, is being weaned off of her keppra and onto carbamazepine instead. Was prescribed this while in the hospital a year ago. She had suffered a cerebral aneurysm. There was question as to whether or not she had seizures after she had brain surgery. She was put on the keppra prophylactically. She has a written schedule, provided by the neurologist, weaning off of the keppra.  Overall, the patient states that she is feeling well. Does still have episodes of depression and crying. Does have more good days than bad days.     Current Medication: Outpatient Encounter Medications as of 02/12/2018  Medication Sig  . ALPRAZolam (XANAX) 0.5 MG tablet Take 1 tablet (0.5 mg total) by mouth 3 (three) times daily as needed for anxiety.  . Ascorbic Acid (VITAMIN C PO) Take by mouth.  Marland Kitchen aspirin 81 MG chewable tablet Chew by mouth daily.  Marland Kitchen atenolol (TENORMIN) 25 MG tablet take 1 TO 2 tablet by mouth once daily for blood pressure  . carbamazepine (TEGRETOL) 200 MG tablet Take 200 mg by mouth daily.  Marland Kitchen levETIRAcetam (KEPPRA) 750 MG tablet Take 1 tablet (750 mg total) by mouth 2 (two) times daily. (Patient taking differently: Take 750 mg by mouth 2 (two) times daily. )  . ciprofloxacin (CIPRO) 500 MG tablet Take 1 tablet (500 mg total) by mouth 2 (two) times daily. (Patient not taking: Reported on 02/12/2018)  . diclofenac sodium (VOLTAREN) 1 % GEL Apply 4 g topically 4 (four) times daily.  . Multiple Vitamin (MULTIVITAMIN) capsule Take 1 capsule by mouth daily.  . phenazopyridine (PYRIDIUM) 200 MG tablet Take 1 tablet (200 mg total) by mouth 3 (three)  times daily as needed for pain. (Patient not taking: Reported on 01/01/2018)   No facility-administered encounter medications on file as of 02/12/2018.     Surgical History: Past Surgical History:  Procedure Laterality Date  . CHOLECYSTECTOMY      Medical History: Past Medical History:  Diagnosis Date  . Hypertension     Family History: Family History  Problem Relation Age of Onset  . Cancer Sister   . Cancer Brother       Review of Systems  Constitutional: Positive for fatigue. Negative for activity change, chills and unexpected weight change.  HENT: Positive for rhinorrhea. Negative for congestion, postnasal drip, sneezing and sore throat.   Eyes: Negative.  Negative for redness.  Respiratory: Negative for cough, chest tightness, shortness of breath and wheezing.   Cardiovascular: Negative for chest pain and palpitations.  Gastrointestinal: Negative for abdominal pain, constipation, diarrhea, nausea and vomiting.  Endocrine: Negative for cold intolerance, heat intolerance, polydipsia, polyphagia and polyuria.  Genitourinary: Negative for dysuria, frequency and urgency.  Musculoskeletal: Negative for arthralgias, back pain, joint swelling, myalgias and neck pain.  Skin: Negative for rash.  Allergic/Immunologic: Negative for environmental allergies.  Neurological: Positive for dizziness, weakness and headaches. Negative for tremors and numbness.       Has noted some decline in her cognitive ability.   Hematological: Negative for adenopathy. Does not bruise/bleed easily.  Psychiatric/Behavioral: Positive for dysphoric mood and sleep disturbance. Negative for behavioral  problems (Depression) and suicidal ideas. The patient is nervous/anxious.        Improved since her last visit    Today's Vitals   02/12/18 1157  BP: (!) 137/52  Pulse: (!) 58  Resp: 16  SpO2: 94%  Weight: 106 lb 6.4 oz (48.3 kg)  Height: 5' (1.524 m)    Physical Exam  Constitutional: She is  oriented to person, place, and time. She appears well-developed and well-nourished. No distress.  HENT:  Head: Normocephalic and atraumatic.  Nose: Rhinorrhea present.  Mouth/Throat: Oropharynx is clear and moist. No oropharyngeal exudate.  Eyes: Pupils are equal, round, and reactive to light. Conjunctivae and EOM are normal.  Neck: Normal range of motion. Neck supple. No JVD present. No tracheal deviation present. No thyromegaly present.  Cardiovascular: Normal rate, regular rhythm, normal heart sounds and intact distal pulses. Exam reveals no gallop and no friction rub.  No murmur heard. Pulmonary/Chest: Effort normal and breath sounds normal. No respiratory distress. She has no wheezes. She has no rales. She exhibits no tenderness. Right breast exhibits no inverted nipple, no mass, no nipple discharge, no skin change and no tenderness. Left breast exhibits no inverted nipple, no mass, no nipple discharge, no skin change and no tenderness.  Abdominal: Soft. Bowel sounds are normal. There is no tenderness.  Musculoskeletal: Normal range of motion.  Lymphadenopathy:    She has no cervical adenopathy.  Neurological: She is alert and oriented to person, place, and time. No cranial nerve deficit.  Patient is at neurological baseline.   Skin: Skin is warm and dry. Capillary refill takes 2 to 3 seconds. She is not diaphoretic.  Psychiatric: Her speech is normal and behavior is normal. Judgment and thought content normal. Her mood appears anxious. Cognition and memory are normal. She exhibits a depressed mood.  The patient is weepy. She is not suicidal or homicidal.   Nursing note and vitals reviewed.  Depression screen [facility] 2/9 02/12/2018 01/01/2018 07/25/2017  Decreased Interest 0 0 0  Down, Depressed, Hopeless 0 1 0  PHQ - 2 Score 0 1 0    Functional Status Survey: Is the patient deaf or have difficulty hearing?: No Does the patient have difficulty seeing, even when wearing glasses/contacts?:  No Does the patient have difficulty concentrating, remembering, or making decisions?: Yes Does the patient have difficulty walking or climbing stairs?: Yes(new medication makes her dizzy) Does the patient have difficulty dressing or bathing?: No Does the patient have difficulty doing errands alone such as visiting a doctor's office or shopping?: No  MMSE - [place] Exam 02/12/2018  Orientation to time 4  Orientation to Place 5  Registration 3  Attention/ Calculation 5  Recall 3  Language- name 2 objects 2  Language- repeat 1  Language- follow 3 step command 3  Language- read & follow direction 1  Write a sentence 1  Copy design 1  Total score 29    Fall Risk  02/12/2018 01/01/2018 07/25/2017 01/03/2016  Falls in the past year? No No No Yes  Comment - - - [facility] Telephone Survey: data to providers prior to load  Number falls in past yr: - - - 1  Comment - - - [facility] Telephone Survey Actual Response = 1  Injury with Fall? - - - No     LABS: Recent Results (from the past 2160 hour(s))  UA/M w/rflx Culture, Routine     Status: Abnormal   Collection Time: 02/12/18 12:38 PM  Result Value Ref  Range   Specific Gravity, UA 1.025 1.005 - 1.030   pH, UA 5.0 5.0 - 7.5   Color, UA Yellow Yellow   Appearance Ur Turbid (A) Clear   Leukocytes, UA 1+ (A) Negative   Protein, UA Negative Negative/Trace   Glucose, UA Negative Negative   Ketones, UA Negative Negative   RBC, UA Negative Negative   Bilirubin, UA Negative Negative   Urobilinogen, Ur 0.2 0.2 - 1.0 mg/dL   Nitrite, UA Negative Negative   Microscopic Examination See below:     Comment: Microscopic was indicated and was performed.   Urinalysis Reflex Comment     Comment: This specimen has reflexed to a Urine Culture.  Microscopic Examination     Status: Abnormal   Collection Time: 02/12/18 12:38 PM  Result Value Ref Range   WBC, UA 11-30 (A) 0 - 5 /hpf   RBC, UA 0-2 0 - 2 /hpf   Epithelial Cells (non renal) >10 (A) 0 - 10  /hpf   Casts None seen None seen /lpf   Crystals Present (A) N/A   Crystal Type Calcium Oxalate N/A   Mucus, UA Present Not Estab.   Bacteria, UA Many (A) None seen/Few  Urine Culture, Reflex     Status: None   Collection Time: 02/12/18 12:38 PM  Result Value Ref Range   Urine Culture, Routine Final report    Organism ID, Bacteria No growth     Assessment/Plan:  1. Encounter for general adult medical examination with abnormal findings Medicare wellness visit today  2. Essential hypertension Generally stable. Continue atenolol as prescribed  3. History of surgery for cerebral aneurysm Now seeing neurologist and in the process of weaning process from keppra to tegretol. Advised her to continue regular visits as scheduled.  4. Osteoarthritis, unspecified osteoarthritis type, unspecified site Use voltaren gel as needed and as prescribed . - diclofenac sodium (VOLTAREN) 1 % GEL; Apply 4 g topically 4 (four) times daily.  Dispense: 100 g; Refill: 3  5. Screening for breast cancer Order for mammogram today  6. Dysuria - UA/M w/rflx Culture, Routine   General Counseling: Driana verbalizes understanding of the findings of todays visit and agrees with plan of treatment. I have discussed any further diagnostic evaluation that may be needed or ordered today. We also reviewed her medications today. she has been encouraged to call the office with any questions or concerns that should arise related to todays visit.    Counseling:  Hypertension Counseling:   The following hypertensive lifestyle modification were recommended and discussed:  1. Limiting alcohol intake to less than 1 oz/day of ethanol:(24 oz of beer or 8 oz of wine or 2 oz of 100-proof whiskey). 2. Take baby ASA 81 mg daily. 3. Importance of regular aerobic exercise and losing weight. 4. Reduce dietary saturated fat and cholesterol intake for overall cardiovascular health. 5. Maintaining adequate dietary potassium,  calcium, and magnesium intake. 6. Regular monitoring of the blood pressure. 7. Reduce sodium intake to less than 100 mmol/day (less than 2.3 gm of sodium or less than 6 gm of sodium choride)   This patient was seen by [place] with Dr Lavera Guise as a part of collaborative care agreement  Orders Placed This Encounter  Procedures  . Microscopic Examination  . Urine Culture, Reflex  . UA/M w/rflx Culture, Routine    Meds ordered this encounter  Medications  . diclofenac sodium (VOLTAREN) 1 % GEL    Sig: Apply 4 g  topically 4 (four) times daily.    Dispense:  100 g    Refill:  3    Order Specific Question:   Supervising Provider    Answer:   Lavera Guise [[id number]]    Time spent: [place], MD  Internal Medicine

## 2018-02-15 DIAGNOSIS — Z1239 Encounter for other screening for malignant neoplasm of breast: Secondary | ICD-10-CM

## 2018-02-15 DIAGNOSIS — Z79899 Other long term (current) drug therapy: Secondary | ICD-10-CM | POA: Insufficient documentation

## 2018-02-15 DIAGNOSIS — M1990 Unspecified osteoarthritis, unspecified site: Secondary | ICD-10-CM | POA: Insufficient documentation

## 2018-02-15 LAB — MICROSCOPIC EXAMINATION: CASTS: NONE SEEN /LPF

## 2018-02-15 LAB — UA/M W/RFLX CULTURE, ROUTINE
BILIRUBIN UA: NEGATIVE
Glucose, UA: NEGATIVE
Ketones, UA: NEGATIVE
Nitrite, UA: NEGATIVE
PH UA: 5 (ref 5.0–7.5)
PROTEIN UA: NEGATIVE
RBC, UA: NEGATIVE
Specific Gravity, UA: 1.025 (ref 1.005–1.030)
Urobilinogen, Ur: 0.2 mg/dL (ref 0.2–1.0)

## 2018-02-15 LAB — URINE CULTURE, REFLEX: ORGANISM ID, BACTERIA: NO GROWTH

## 2018-03-30 ENCOUNTER — Other Ambulatory Visit: Payer: Self-pay | Admitting: Nurse Practitioner

## 2018-03-30 DIAGNOSIS — F411 Generalized anxiety disorder: Secondary | ICD-10-CM

## 2018-03-30 MED ORDER — ALPRAZOLAM 0.5 MG PO TABS: 0.5000 mg | ORAL_TABLET | Freq: Three times a day (TID) | ORAL | 1 refills | Status: DC | PRN

## 2018-04-02 DIAGNOSIS — C3432 Malignant neoplasm of lower lobe, left bronchus or lung: Secondary | ICD-10-CM | POA: Diagnosis not present

## 2018-04-03 ENCOUNTER — Ambulatory Visit: Payer: Self-pay | Admitting: Nurse Practitioner

## 2018-04-14 ENCOUNTER — Encounter: Payer: Self-pay | Admitting: Nurse Practitioner

## 2018-04-14 ENCOUNTER — Ambulatory Visit (INDEPENDENT_AMBULATORY_CARE_PROVIDER_SITE_OTHER): Payer: Medicare Other | Admitting: Nurse Practitioner

## 2018-04-14 VITALS — BP 131/55 | HR 76 | Temp 97.5°F | Resp 16 | Ht 60.0 in | Wt 104.6 lb

## 2018-04-14 DIAGNOSIS — L209 Atopic dermatitis, unspecified: Secondary | ICD-10-CM | POA: Diagnosis not present

## 2018-04-14 DIAGNOSIS — G40909 Epilepsy, unspecified, not intractable, without status epilepticus: Secondary | ICD-10-CM | POA: Diagnosis not present

## 2018-04-14 DIAGNOSIS — R197 Diarrhea, unspecified: Secondary | ICD-10-CM

## 2018-04-14 DIAGNOSIS — R110 Nausea: Secondary | ICD-10-CM | POA: Diagnosis not present

## 2018-04-14 MED ORDER — CIPROFLOXACIN HCL 250 MG PO TABS: 250.0000 mg | ORAL_TABLET | Freq: Two times a day (BID) | ORAL | 1 refills | Status: DC

## 2018-04-14 MED ORDER — ONDANSETRON HCL 4 MG PO TABS: 4.0000 mg | ORAL_TABLET | Freq: Three times a day (TID) | ORAL | 0 refills | Status: DC | PRN

## 2018-04-14 MED ORDER — DIPHENOXYLATE-ATROPINE 2.5-0.025 MG PO TABS: 1.0000 | ORAL_TABLET | Freq: Four times a day (QID) | ORAL | 0 refills | Status: DC | PRN

## 2018-04-14 MED ORDER — CLOTRIMAZOLE-BETAMETHASONE 1-0.05 % EX CREA: 1.0000 "application " | TOPICAL_CREAM | Freq: Two times a day (BID) | CUTANEOUS | 1 refills | Status: DC

## 2018-04-14 MED ORDER — METRONIDAZOLE 250 MG PO TABS: 250.0000 mg | ORAL_TABLET | Freq: Two times a day (BID) | ORAL | 0 refills | Status: DC

## 2018-04-14 NOTE — Progress Notes (Signed)
[facility] [place], [place] [address]  Internal MEDICINE  Office Visit Note  Patient Name: Monica Collins  [id number]  [id number]  Date of Service: 04/15/2018   Pt is here for a sick visit.no   Chief Complaint  Patient presents with  . Rash    pt has lumps on both arms   . Diarrhea    has had diarrhea for about 10 days. no fever or chills     The patient is here for sick visit. Started a week ago last Friday. She is having 10 to 15 loose, explosive loose bowel movements every day since this started. She does have stomach cramps prior to each episode of diarrhea. She denies fever. She has decreased appetite. In fact, when she eats, she has episode of diarrhea.        Current Medication:  Outpatient Encounter Medications as of 04/14/2018  Medication Sig  . ALPRAZolam (XANAX) 0.5 MG tablet Take 1 tablet (0.5 mg total) by mouth 3 (three) times daily as needed for anxiety.  . Ascorbic Acid (VITAMIN C PO) Take by mouth.  Marland Kitchen aspirin 81 MG chewable tablet Chew by mouth daily.  Marland Kitchen atenolol (TENORMIN) 25 MG tablet take 1 TO 2 tablet by mouth once daily for blood pressure  . carbamazepine (TEGRETOL) 200 MG tablet Take 200 mg by mouth daily.  . diclofenac sodium (VOLTAREN) 1 % GEL Apply 4 g topically 4 (four) times daily.  Marland Kitchen levETIRAcetam (KEPPRA) 750 MG tablet Take 1 tablet (750 mg total) by mouth 2 (two) times daily. (Patient taking differently: Take 750 mg by mouth 2 (two) times daily. )  . Multiple Vitamin (MULTIVITAMIN) capsule Take 1 capsule by mouth daily.  . ciprofloxacin (CIPRO) 250 MG tablet Take 1 tablet (250 mg total) by mouth 2 (two) times daily.  . clotrimazole-betamethasone (LOTRISONE) cream Apply 1 application topically 2 (two) times daily.  . diphenoxylate-atropine (LOMOTIL) 2.5-0.025 MG tablet Take 1 tablet by mouth 4 (four) times daily as needed for diarrhea or loose stools.  . metroNIDAZOLE (FLAGYL) 250 MG tablet Take 1 tablet (250 mg  total) by mouth 2 (two) times daily.  . ondansetron (ZOFRAN) 4 MG tablet Take 1 tablet (4 mg total) by mouth every 8 (eight) hours as needed for nausea or vomiting.  . phenazopyridine (PYRIDIUM) 200 MG tablet Take 1 tablet (200 mg total) by mouth 3 (three) times daily as needed for pain. (Patient not taking: Reported on 01/01/2018)  . [DISCONTINUED] ciprofloxacin (CIPRO) 500 MG tablet Take 1 tablet (500 mg total) by mouth 2 (two) times daily. (Patient not taking: Reported on 02/12/2018)   No facility-administered encounter medications on file as of 04/14/2018.       Medical History: Past Medical History:  Diagnosis Date  . Hypertension      Today's Vitals   04/14/18 1401  BP: (!) 131/55  Pulse: 76  Resp: 16  Temp: (!) 97.5 F (36.4 C)  SpO2: 96%  Weight: 104 lb 9.6 oz (47.4 kg)  Height: 5' (1.524 m)    Review of Systems  Constitutional: Positive for activity change, appetite change and fatigue. Negative for chills and unexpected weight change.  HENT: Negative for congestion, postnasal drip, rhinorrhea, sneezing and sore throat.   Respiratory: Negative for cough, chest tightness and shortness of breath.   Cardiovascular: Negative for chest pain and palpitations.  Gastrointestinal: Positive for diarrhea, nausea and vomiting. Negative for abdominal pain and constipation.  Endocrine: Negative for cold intolerance, heat intolerance,  polydipsia and polyuria.  Musculoskeletal: Positive for myalgias. Negative for arthralgias, back pain, joint swelling and neck pain.  Skin: Positive for rash.       Present in the bends of her elbows. Rash is red and itchy and flaky in appearance.   Allergic/Immunologic: Negative for environmental allergies.  Neurological: Positive for weakness. Negative for tremors and numbness.  Hematological: Negative for adenopathy. Does not bruise/bleed easily.  Psychiatric/Behavioral: Negative for behavioral problems (Depression), sleep disturbance and suicidal  ideas. The patient is nervous/anxious.     Physical Exam Vitals signs and nursing note reviewed.  Constitutional:      General: She is not in acute distress.    Appearance: She is well-developed. She is ill-appearing. She is not diaphoretic.  HENT:     Head: Normocephalic and atraumatic.     Nose: Nose normal.     Mouth/Throat:     Mouth: Mucous membranes are moist.     Pharynx: Oropharynx is clear. No oropharyngeal exudate.  Eyes:     Pupils: Pupils are equal, round, and reactive to light.  Neck:     Musculoskeletal: Normal range of motion and neck supple.     Thyroid: No thyromegaly.     Vascular: No JVD.     Trachea: No tracheal deviation.  Cardiovascular:     Rate and Rhythm: Normal rate and regular rhythm.     Heart sounds: Normal heart sounds. No murmur. No friction rub. No gallop.   Pulmonary:     Effort: Pulmonary effort is normal. No respiratory distress.     Breath sounds: Normal breath sounds. No wheezing or rales.  Chest:     Chest wall: No tenderness.  Abdominal:     Palpations: Abdomen is soft.     Tenderness: There is abdominal tenderness.     Comments: Mild, generalized tenderness with palpation. Hyperactive bowel sounds.  Musculoskeletal: Normal range of motion.  Lymphadenopathy:     Cervical: No cervical adenopathy.  Skin:    General: Skin is warm and dry.     Findings: Rash present.     Comments: Fine, red, raised rash in antecubital fossa, bilaterally. Slightly warm to touch. Skin intact with no drainage present.   Neurological:     Mental Status: She is alert and oriented to person, place, and time. Mental status is at baseline.     Cranial Nerves: No cranial nerve deficit.  Psychiatric:        Mood and Affect: Mood normal.        Behavior: Behavior normal.        Thought Content: Thought content normal.        Judgment: Judgment normal.   Assessment/Plan: 1. Diarrhea, unspecified type Suspect bacterial in origin. Start cipro 250mg  and flagyl  250mg  twice daily for 10 days. Add lomotil up to four times daily if needed for diarrhea. Will send for stool studies if no better in next week. BLANd/BRAT diet recommended. Advance as tolerated. - ciprofloxacin (CIPRO) 250 MG tablet; Take 1 tablet (250 mg total) by mouth 2 (two) times daily.  Dispense: 20 tablet; Refill: 1 - metroNIDAZOLE (FLAGYL) 250 MG tablet; Take 1 tablet (250 mg total) by mouth 2 (two) times daily.  Dispense: 20 tablet; Refill: 0 - diphenoxylate-atropine (LOMOTIL) 2.5-0.025 MG tablet; Take 1 tablet by mouth 4 (four) times daily as needed for diarrhea or loose stools.  Dispense: 30 tablet; Refill: 0  2. Nausea zofran 4mg  may be taken up to three times daily if needed  for nausea. BRAT diet recommended. Advance as tolerate.d  - ondansetron (ZOFRAN) 4 MG tablet; Take 1 tablet (4 mg total) by mouth every 8 (eight) hours as needed for nausea or vomiting.  Dispense: 30 tablet; Refill: 0  3. Atopic dermatitis, unspecified type Apply lotrisone cream to affected areas twice daily for next 7 t o 10 days.  - clotrimazole-betamethasone (LOTRISONE) cream; Apply 1 application topically 2 (two) times daily.  Dispense: 45 g; Refill: 1  4. Seizure disorder ([place]) meds as prescribed per neurology. Visits to neurology as scheduled.   General Counseling: Channah verbalizes understanding of the findings of todays visit and agrees with plan of treatment. I have discussed any further diagnostic evaluation that may be needed or ordered today. We also reviewed her medications today. she has been encouraged to call the office with any questions or concerns that should arise related to todays visit.    Counseling:  Rest and increase fluids. Continue using OTC medication to control symptoms.   The 'BRAT' diet is suggested, then progress to diet as tolerated as symptoms abate. Call if bloody stools, persistent diarrhea, vomiting, fever or abdominal pain.  This patient was seen by [place] with Dr Lavera Guise as a part of collaborative care agreement  Meds ordered this encounter  Medications  . ciprofloxacin (CIPRO) 250 MG tablet    Sig: Take 1 tablet (250 mg total) by mouth 2 (two) times daily.    Dispense:  20 tablet    Refill:  1    Order Specific Question:   Supervising Provider    Answer:   Lavera Guise [[id number]]  . metroNIDAZOLE (FLAGYL) 250 MG tablet    Sig: Take 1 tablet (250 mg total) by mouth 2 (two) times daily.    Dispense:  20 tablet    Refill:  0    Order Specific Question:   Supervising Provider    Answer:   Lavera Guise [[id number]]  . diphenoxylate-atropine (LOMOTIL) 2.5-0.025 MG tablet    Sig: Take 1 tablet by mouth 4 (four) times daily as needed for diarrhea or loose stools.    Dispense:  30 tablet    Refill:  0    Order Specific Question:   Supervising Provider    Answer:   Lavera Guise [[id number]]  . ondansetron (ZOFRAN) 4 MG tablet    Sig: Take 1 tablet (4 mg total) by mouth every 8 (eight) hours as needed for nausea or vomiting.    Dispense:  30 tablet    Refill:  0    Order Specific Question:   Supervising Provider    Answer:   Lavera Guise [[id number]]  . clotrimazole-betamethasone (LOTRISONE) cream    Sig: Apply 1 application topically 2 (two) times daily.    Dispense:  45 g    Refill:  1    Order Specific Question:   Supervising Provider    Answer:   Lavera Guise [[id number]]    Time spent: 25 Minutes

## 2018-04-15 DIAGNOSIS — L209 Atopic dermatitis, unspecified: Secondary | ICD-10-CM | POA: Insufficient documentation

## 2018-04-15 DIAGNOSIS — R110 Nausea: Secondary | ICD-10-CM | POA: Insufficient documentation

## 2018-04-15 DIAGNOSIS — R197 Diarrhea, unspecified: Secondary | ICD-10-CM | POA: Insufficient documentation

## 2018-04-15 DIAGNOSIS — G40909 Epilepsy, unspecified, not intractable, without status epilepticus: Secondary | ICD-10-CM | POA: Insufficient documentation

## 2018-04-24 ENCOUNTER — Ambulatory Visit (INDEPENDENT_AMBULATORY_CARE_PROVIDER_SITE_OTHER): Payer: Medicare Other | Admitting: Nurse Practitioner

## 2018-04-24 ENCOUNTER — Encounter: Payer: Self-pay | Admitting: Nurse Practitioner

## 2018-04-24 VITALS — BP 142/70 | HR 58 | Resp 16 | Ht 60.0 in | Wt 105.6 lb

## 2018-04-24 DIAGNOSIS — F411 Generalized anxiety disorder: Secondary | ICD-10-CM | POA: Diagnosis not present

## 2018-04-24 DIAGNOSIS — L209 Atopic dermatitis, unspecified: Secondary | ICD-10-CM

## 2018-04-24 MED ORDER — TRIAMCINOLONE ACETONIDE 0.025 % EX CREA: 1.0000 "application " | TOPICAL_CREAM | Freq: Two times a day (BID) | CUTANEOUS | 2 refills | Status: DC

## 2018-04-24 MED ORDER — ALPRAZOLAM 0.5 MG PO TABS: 0.5000 mg | ORAL_TABLET | Freq: Three times a day (TID) | ORAL | 1 refills | Status: DC | PRN

## 2018-04-24 NOTE — Progress Notes (Signed)
[facility] [place], [place] [address]  Internal MEDICINE  Office Visit Note  Patient Name: Monica Collins  [id number]  [id number]  Date of Service: 04/26/2018   Pt is here for a sick visit.   Chief Complaint  Patient presents with  . Rash    red rash all over body, has had it for about a month and it is showing no improvement, may possibly be coming from a medication  . Edema    swelling in the ankles and feet believes it could be from Cipro. swelling started when pt started the antibiotic symptoms started two days after starting the medication  . Quality Metric Gaps    pt does not get the flu vaccine     The patient states that she noted swelling of her ankles about two or three days after starting on cipro. She continued to take flagy after that. Swelling improved some, however, she started to develop patchy, hive-like rash on both legs. Rash is very itchy. Skin is intact. She has also noted very itchy skin on her back without any rash present.        Current Medication:  Outpatient Encounter Medications as of 04/24/2018  Medication Sig  . ALPRAZolam (XANAX) 0.5 MG tablet Take 1 tablet (0.5 mg total) by mouth 3 (three) times daily as needed for anxiety.  . Ascorbic Acid (VITAMIN C PO) Take by mouth.  Marland Kitchen aspirin 81 MG chewable tablet Chew by mouth daily.  Marland Kitchen atenolol (TENORMIN) 25 MG tablet take 1 TO 2 tablet by mouth once daily for blood pressure  . carbamazepine (TEGRETOL) 200 MG tablet Take 200 mg by mouth daily.  . ciprofloxacin (CIPRO) 250 MG tablet Take 1 tablet (250 mg total) by mouth 2 (two) times daily.  . clotrimazole-betamethasone (LOTRISONE) cream Apply 1 application topically 2 (two) times daily.  . diclofenac sodium (VOLTAREN) 1 % GEL Apply 4 g topically 4 (four) times daily.  . diphenoxylate-atropine (LOMOTIL) 2.5-0.025 MG tablet Take 1 tablet by mouth 4 (four) times daily as needed for diarrhea or loose stools.  . levETIRAcetam  (KEPPRA) 750 MG tablet Take 1 tablet (750 mg total) by mouth 2 (two) times daily. (Patient taking differently: Take 750 mg by mouth 2 (two) times daily. )  . metroNIDAZOLE (FLAGYL) 250 MG tablet Take 1 tablet (250 mg total) by mouth 2 (two) times daily.  . Multiple Vitamin (MULTIVITAMIN) capsule Take 1 capsule by mouth daily.  . ondansetron (ZOFRAN) 4 MG tablet Take 1 tablet (4 mg total) by mouth every 8 (eight) hours as needed for nausea or vomiting.  . phenazopyridine (PYRIDIUM) 200 MG tablet Take 1 tablet (200 mg total) by mouth 3 (three) times daily as needed for pain.  . [DISCONTINUED] ALPRAZolam (XANAX) 0.5 MG tablet Take 1 tablet (0.5 mg total) by mouth 3 (three) times daily as needed for anxiety.  . [DISCONTINUED] ALPRAZolam (XANAX) 0.5 MG tablet Take 1 tablet (0.5 mg total) by mouth 3 (three) times daily as needed for anxiety.  . triamcinolone (KENALOG) 0.025 % cream Apply 1 application topically 2 (two) times daily.   No facility-administered encounter medications on file as of 04/24/2018.       Medical History: Past Medical History:  Diagnosis Date  . Hypertension      Today's Vitals   04/24/18 1509  BP: (!) 142/70  Pulse: (!) 58  Resp: 16  SpO2: 93%  Weight: 105 lb 9.6 oz (47.9 kg)  Height: 5' (1.524 m)  Review of Systems  Constitutional: Positive for fatigue. Negative for chills and unexpected weight change.  HENT: Negative for congestion, postnasal drip, sneezing and sore throat.   Respiratory: Negative for cough, chest tightness and shortness of breath.   Cardiovascular: Negative for chest pain and palpitations.  Gastrointestinal: Negative for abdominal pain, constipation, diarrhea, nausea and vomiting.       Abdominal cramping and diarrhea have resolved since her most recent visit   Endocrine: Negative for cold intolerance, heat intolerance, polydipsia and polyuria.  Musculoskeletal: Negative for arthralgias, back pain, joint swelling and neck pain.  Skin:  Positive for rash.       Very itchy and dry skin present on both legs and on her back.   Allergic/Immunologic: Negative for environmental allergies.  Neurological: Negative for tremors, numbness and headaches.  Hematological: Negative for adenopathy. Does not bruise/bleed easily.  Psychiatric/Behavioral: Negative for behavioral problems (Depression), sleep disturbance and suicidal ideas. The patient is nervous/anxious.     Physical Exam Vitals signs and nursing note reviewed.  Constitutional:      General: She is not in acute distress.    Appearance: Normal appearance. She is well-developed. She is not diaphoretic.  HENT:     Head: Normocephalic and atraumatic.     Mouth/Throat:     Pharynx: No oropharyngeal exudate.  Neck:     Musculoskeletal: Normal range of motion and neck supple.     Thyroid: No thyromegaly.     Vascular: No JVD.     Trachea: No tracheal deviation.  Cardiovascular:     Rate and Rhythm: Normal rate and regular rhythm.     Heart sounds: Normal heart sounds. No murmur. No friction rub. No gallop.   Pulmonary:     Effort: Pulmonary effort is normal. No respiratory distress.     Breath sounds: No wheezing or rales.  Chest:     Chest wall: No tenderness.  Abdominal:     General: Bowel sounds are normal.     Palpations: Abdomen is soft.  Musculoskeletal: Normal range of motion.  Lymphadenopathy:     Cervical: No cervical adenopathy.  Skin:    General: Skin is warm and dry.  Neurological:     Mental Status: She is alert and oriented to person, place, and time.     Cranial Nerves: No cranial nerve deficit.  Psychiatric:        Attention and Perception: Attention and perception normal.        Mood and Affect: Affect normal. Mood is anxious.        Speech: Speech normal.        Behavior: Behavior normal.        Thought Content: Thought content normal.        Cognition and Memory: Cognition normal.        Judgment: Judgment normal.   Assessment/Plan: 1.  Atopic dermatitis, unspecified type Most likely related to significantly dry skin. Start triamcinolone 0.025% cream. Apply twice daily as needed. Recommend she mix this with hydrating lotion to help prevent further lesions from occurring.  - triamcinolone (KENALOG) 0.025 % cream; Apply 1 application topically 2 (two) times daily.  Dispense: 80 g; Refill: 2  2. Anxiety, generalized May continue alprazolam 0.5mg  up to three times daily as needed. New prescription provided today.  - ALPRAZolam (XANAX) 0.5 MG tablet; Take 1 tablet (0.5 mg total) by mouth 3 (three) times daily as needed for anxiety.  Dispense: 90 tablet; Refill: 1  General Counseling: Keshana verbalizes understanding  of the findings of todays visit and agrees with plan of treatment. I have discussed any further diagnostic evaluation that may be needed or ordered today. We also reviewed her medications today. she has been encouraged to call the office with any questions or concerns that should arise related to todays visit.    Counseling:  Reviewed risks and possible side effects associated with taking opiates, benzodiazepines and other CNS depressants. Combination of these could cause dizziness and drowsiness. Advised patient not to drive or operate machinery when taking these medications, as patient's and other's life can be at risk and will have consequences. Patient verbalized understanding in this matter. Dependence and abuse for these drugs will be monitored closely. A Controlled substance policy and procedure is on file which allows [facility] to order a urine drug screen test at any visit. Patient understands and agrees with the plan  This patient was seen by Leretha Pol FNP Collaboration with Dr Lavera Guise as a part of collaborative care agreement  Meds ordered this encounter  Medications  . triamcinolone (KENALOG) 0.025 % cream    Sig: Apply 1 application topically 2 (two) times daily.    Dispense:  80 g     Refill:  2    Order Specific Question:   Supervising Provider    Answer:   Lavera Guise [[id number]]  . DISCONTD: ALPRAZolam (XANAX) 0.5 MG tablet    Sig: Take 1 tablet (0.5 mg total) by mouth 3 (three) times daily as needed for anxiety.    Dispense:  90 tablet    Refill:  1    Please add refills to current prescription.    Order Specific Question:   Supervising Provider    Answer:   Lavera Guise [[id number]]  . ALPRAZolam (XANAX) 0.5 MG tablet    Sig: Take 1 tablet (0.5 mg total) by mouth 3 (three) times daily as needed for anxiety.    Dispense:  90 tablet    Refill:  1    Order Specific Question:   Supervising Provider    Answer:   Lavera Guise [[id number]]    Time spent: 15 Minutes

## 2018-05-19 ENCOUNTER — Ambulatory Visit (INDEPENDENT_AMBULATORY_CARE_PROVIDER_SITE_OTHER): Payer: Medicare Other | Admitting: Nurse Practitioner

## 2018-05-19 ENCOUNTER — Encounter: Payer: Self-pay | Admitting: Nurse Practitioner

## 2018-05-19 VITALS — BP 158/70 | HR 67 | Resp 16 | Ht 60.0 in | Wt 103.0 lb

## 2018-05-19 DIAGNOSIS — Z9889 Other specified postprocedural states: Secondary | ICD-10-CM | POA: Diagnosis not present

## 2018-05-19 DIAGNOSIS — I10 Essential (primary) hypertension: Secondary | ICD-10-CM | POA: Diagnosis not present

## 2018-05-19 DIAGNOSIS — F411 Generalized anxiety disorder: Secondary | ICD-10-CM | POA: Diagnosis not present

## 2018-05-19 DIAGNOSIS — G40909 Epilepsy, unspecified, not intractable, without status epilepticus: Secondary | ICD-10-CM

## 2018-05-19 MED ORDER — ALPRAZOLAM 0.5 MG PO TABS: 0.5000 mg | ORAL_TABLET | Freq: Three times a day (TID) | ORAL | 1 refills | Status: DC | PRN

## 2018-05-19 MED ORDER — ATENOLOL 25 MG PO TABS: ORAL_TABLET | ORAL | 3 refills | Status: DC

## 2018-05-19 NOTE — Progress Notes (Signed)
[facility] [place], [place] [address]  Internal MEDICINE  Office Visit Note  Patient Name: Monica Collins  [id number]  [id number]  Date of Service: 05/27/2018  Chief Complaint  Patient presents with  . Medical Management of Chronic Issues    3 month follow up, medication refills  . Hypertension    The patient is here for routine follow up visit. Blood pressure elevated today. States she is taking blood pressure medication every day. Monitors blood pressure at home and is doing well. Has weaned herself off of all anti-seizure medications. States that she is feeling better than she has in a very long time. Has not disclosed her discontinuation of keppra with neurologist. States provider would be unhappy with this decision, however, she does not plant to go back on anti-seizure medications at this time.       Current Medication: Outpatient Encounter Medications as of 05/19/2018  Medication Sig  . Ascorbic Acid (VITAMIN C PO) Take by mouth.  Marland Kitchen aspirin 81 MG chewable tablet Chew by mouth daily.  Marland Kitchen atenolol (TENORMIN) 25 MG tablet Take 1 to 2 tablets po QD for HTN  . clotrimazole-betamethasone (LOTRISONE) cream Apply 1 application topically 2 (two) times daily.  . diclofenac sodium (VOLTAREN) 1 % GEL Apply 4 g topically 4 (four) times daily.  . Multiple Vitamin (MULTIVITAMIN) capsule Take 1 capsule by mouth daily.  . ondansetron (ZOFRAN) 4 MG tablet Take 1 tablet (4 mg total) by mouth every 8 (eight) hours as needed for nausea or vomiting.  . triamcinolone (KENALOG) 0.025 % cream Apply 1 application topically 2 (two) times daily.  . [DISCONTINUED] ALPRAZolam (XANAX) 0.25 MG tablet Take 1 tablet by mouth.  . [DISCONTINUED] atenolol (TENORMIN) 25 MG tablet take 1 TO 2 tablet by mouth once daily for blood pressure  . [DISCONTINUED] atenolol (TENORMIN) 25 MG tablet Take 1 to 2 tablets po QD for HTN  . ALPRAZolam (XANAX) 0.5 MG tablet Take 1 tablet (0.5 mg total)  by mouth 3 (three) times daily as needed for anxiety.  . ciprofloxacin (CIPRO) 250 MG tablet Take 1 tablet (250 mg total) by mouth 2 (two) times daily. (Patient not taking: Reported on 05/19/2018)  . diphenoxylate-atropine (LOMOTIL) 2.5-0.025 MG tablet Take 1 tablet by mouth 4 (four) times daily as needed for diarrhea or loose stools. (Patient not taking: Reported on 05/19/2018)  . Ginger, Zingiber officinalis, (GINGER EXTRACT) 250 MG CAPS Take 1 tablet by mouth.  . metroNIDAZOLE (FLAGYL) 250 MG tablet Take 1 tablet (250 mg total) by mouth 2 (two) times daily. (Patient not taking: Reported on 05/19/2018)  . phenazopyridine (PYRIDIUM) 200 MG tablet Take 1 tablet (200 mg total) by mouth 3 (three) times daily as needed for pain. (Patient not taking: Reported on 05/19/2018)  . [DISCONTINUED] ALPRAZolam (XANAX) 0.5 MG tablet Take 1 tablet (0.5 mg total) by mouth 3 (three) times daily as needed for anxiety. (Patient not taking: Reported on 05/19/2018)  . [DISCONTINUED] carbamazepine (TEGRETOL) 200 MG tablet Take 200 mg by mouth daily.  . [DISCONTINUED] levETIRAcetam (KEPPRA) 750 MG tablet Take 1 tablet (750 mg total) by mouth 2 (two) times daily. (Patient not taking: Reported on 05/19/2018)   No facility-administered encounter medications on file as of 05/19/2018.     Surgical History: Past Surgical History:  Procedure Laterality Date  . CHOLECYSTECTOMY      Medical History: Past Medical History:  Diagnosis Date  . Hypertension     Family History: Family History  Problem  Relation Age of Onset  . Cancer Sister   . Cancer Brother     Social History   Socioeconomic History  . Marital status: Married    Spouse name: Not on file  . Number of children: Not on file  . Years of education: Not on file  . Highest education level: Not on file  Occupational History  . Not on file  Social Needs  . Financial resource strain: Not on file  . Food insecurity:    Worry: Not on file    Inability: Not  on file  . Transportation needs:    Medical: Not on file    Non-medical: Not on file  Tobacco Use  . Smoking status: Current Some Day Smoker    Packs/day: 12.00    Years: 50.00    Pack years: 600.00    Types: Cigarettes    Last attempt to quit: 10/21/2016    Years since quitting: 1.5  . Smokeless tobacco: Never Used  Substance and Sexual Activity  . Alcohol use: No    Comment: once a year  . Drug use: Never  . Sexual activity: Not on file  Lifestyle  . Physical activity:    Days per week: Not on file    Minutes per session: Not on file  . Stress: Not on file  Relationships  . Social connections:    Talks on phone: Not on file    Gets together: Not on file    Attends religious service: Not on file    Active member of club or organization: Not on file    Attends meetings of clubs or organizations: Not on file    Relationship status: Not on file  . Intimate partner violence:    Fear of current or ex partner: Not on file    Emotionally abused: Not on file    Physically abused: Not on file    Forced sexual activity: Not on file  Other Topics Concern  . Not on file  Social History Narrative  . Not on file      Review of Systems  Constitutional: Positive for fatigue. Negative for activity change, chills and unexpected weight change.  HENT: Negative for congestion, postnasal drip, rhinorrhea, sneezing and sore throat.   Respiratory: Negative for cough, chest tightness, shortness of breath and wheezing.   Cardiovascular: Negative for chest pain and palpitations.       Elevated blood pressure today  Gastrointestinal: Positive for nausea. Negative for abdominal pain, constipation, diarrhea and vomiting.  Endocrine: Negative for cold intolerance, heat intolerance, polydipsia and polyuria.  Musculoskeletal: Negative for arthralgias, back pain, joint swelling, myalgias and neck pain.  Skin: Negative for rash.  Allergic/Immunologic: Negative for environmental allergies.   Neurological: Positive for dizziness, weakness and headaches. Negative for tremors and numbness.       Has noted some decline in her cognitive ability.   Hematological: Negative for adenopathy. Does not bruise/bleed easily.  Psychiatric/Behavioral: Positive for dysphoric mood and sleep disturbance. Negative for behavioral problems (Depression) and suicidal ideas. The patient is nervous/anxious.        Improved since her last visit    Today's Vitals   05/19/18 1205  BP: (!) 158/70  Pulse: 67  Resp: 16  SpO2: 97%  Weight: 103 lb (46.7 kg)  Height: 5' (1.524 m)   Body mass index is 20.12 kg/m.  Physical Exam Vitals signs and nursing note reviewed.  Constitutional:      General: She is not in acute  distress.    Appearance: Normal appearance. She is well-developed. She is not diaphoretic.  HENT:     Head: Normocephalic and atraumatic.     Mouth/Throat:     Pharynx: No oropharyngeal exudate.  Eyes:     Conjunctiva/sclera: Conjunctivae normal.     Pupils: Pupils are equal, round, and reactive to light.  Neck:     Musculoskeletal: Normal range of motion and neck supple.     Thyroid: No thyromegaly.     Vascular: No carotid bruit or JVD.     Trachea: No tracheal deviation.  Cardiovascular:     Rate and Rhythm: Normal rate and regular rhythm.     Heart sounds: Normal heart sounds. No murmur. No friction rub. No gallop.   Pulmonary:     Effort: Pulmonary effort is normal. No respiratory distress.     Breath sounds: Normal breath sounds. No wheezing or rales.  Chest:     Chest wall: No tenderness.     Breasts:        Right: No inverted nipple, mass, nipple discharge, skin change or tenderness.        Left: No inverted nipple, mass, nipple discharge, skin change or tenderness.  Abdominal:     General: Bowel sounds are normal.     Palpations: Abdomen is soft.     Tenderness: There is no abdominal tenderness.  Musculoskeletal: Normal range of motion.  Lymphadenopathy:      Cervical: No cervical adenopathy.  Skin:    General: Skin is warm and dry.     Capillary Refill: Capillary refill takes 2 to 3 seconds.  Neurological:     Mental Status: She is alert and oriented to person, place, and time.     Cranial Nerves: No cranial nerve deficit.     Comments: Patient is at neurological baseline.   Psychiatric:        Mood and Affect: Mood is anxious and depressed.        Speech: Speech normal.        Behavior: Behavior normal.        Thought Content: Thought content normal.        Judgment: Judgment normal.     Comments: Improved mood    Assessment/Plan: 1. Essential hypertension Continue atenolol 25mg  tablets. Take tow tablets when needed for elevated blood pressure. DASH diet reviewed.  - atenolol (TENORMIN) 25 MG tablet; Take 1 to 2 tablets po QD for HTN  Dispense: 60 tablet; Refill: 3  2. Anxiety, generalized May continue alprazolam 0.5mg  tablets up to three times daily if needed for acute anxiety. New prescription sent to her pharmacy.  - ALPRAZolam (XANAX) 0.5 MG tablet; Take 1 tablet (0.5 mg total) by mouth 3 (three) times daily as needed for anxiety.  Dispense: 90 tablet; Refill: 1  3. History of surgery for cerebral aneurysm Patient has seen neurosurgeon and preventive medications were changed, however, patient has weaned herself off completely. Will monitor closely for evidence of decline or presence of seizures.   4. Seizure disorder [facility]) Patient has seen neurosurgeon and preventive medications were changed, however, patient has weaned herself off completely. Will monitor closely for evidence of decline or presence of seizures.    General Counseling: Sharia verbalizes understanding of the findings of todays visit and agrees with plan of treatment. I have discussed any further diagnostic evaluation that may be needed or ordered today. We also reviewed her medications today. she has been encouraged to call the office with any questions  or concerns  that should arise related to todays visit.  Hypertension Counseling:   The following hypertensive lifestyle modification were recommended and discussed:  1. Limiting alcohol intake to less than 1 oz/day of ethanol:(24 oz of beer or 8 oz of wine or 2 oz of 100-proof whiskey). 2. Take baby ASA 81 mg daily. 3. Importance of regular aerobic exercise and losing weight. 4. Reduce dietary saturated fat and cholesterol intake for overall cardiovascular health. 5. Maintaining adequate dietary potassium, calcium, and magnesium intake. 6. Regular monitoring of the blood pressure. 7. Reduce sodium intake to less than 100 mmol/day (less than 2.3 gm of sodium or less than 6 gm of sodium choride)   This patient was seen by Collin with Dr Lavera Guise as a part of collaborative care agreement  Meds ordered this encounter  Medications  . ALPRAZolam (XANAX) 0.5 MG tablet    Sig: Take 1 tablet (0.5 mg total) by mouth 3 (three) times daily as needed for anxiety.    Dispense:  90 tablet    Refill:  1    Order Specific Question:   Supervising Provider    Answer:   Lavera Guise [[id number]]  . DISCONTD: atenolol (TENORMIN) 25 MG tablet    Sig: Take 1 to 2 tablets po QD for HTN    Dispense:  60 tablet    Refill:  3    Order Specific Question:   Supervising Provider    Answer:   Lavera Guise [place]  . atenolol (TENORMIN) 25 MG tablet    Sig: Take 1 to 2 tablets po QD for HTN    Dispense:  60 tablet    Refill:  3    Order Specific Question:   Supervising Provider    Answer:   Lavera Guise [[id number]]    Time spent: 73 Minutes      Dr Lavera Guise Internal medicine

## 2018-05-21 DIAGNOSIS — G40509 Epileptic seizures related to external causes, not intractable, without status epilepticus: Secondary | ICD-10-CM | POA: Diagnosis not present

## 2018-06-29 ENCOUNTER — Other Ambulatory Visit: Payer: Self-pay | Admitting: Nurse Practitioner

## 2018-06-29 DIAGNOSIS — F411 Generalized anxiety disorder: Secondary | ICD-10-CM

## 2018-06-29 MED ORDER — ALPRAZOLAM 0.5 MG PO TABS: 0.5000 mg | ORAL_TABLET | Freq: Three times a day (TID) | ORAL | 1 refills | Status: DC | PRN

## 2018-06-29 NOTE — Progress Notes (Signed)
Sent new prescription for alprazolam 0.5mg  tid prn to [facility] [address] per patient request.

## 2018-08-17 ENCOUNTER — Other Ambulatory Visit: Payer: Self-pay | Admitting: Nurse Practitioner

## 2018-08-17 DIAGNOSIS — I10 Essential (primary) hypertension: Secondary | ICD-10-CM

## 2018-08-17 MED ORDER — ATENOLOL 25 MG PO TABS: ORAL_TABLET | ORAL | 3 refills | Status: DC

## 2018-08-18 ENCOUNTER — Ambulatory Visit: Payer: Self-pay | Admitting: Nurse Practitioner

## 2018-08-29 ENCOUNTER — Other Ambulatory Visit: Payer: Self-pay | Admitting: Nurse Practitioner

## 2018-08-29 DIAGNOSIS — F411 Generalized anxiety disorder: Secondary | ICD-10-CM

## 2018-08-29 MED ORDER — ALPRAZOLAM 0.5 MG PO TABS: 0.5000 mg | ORAL_TABLET | Freq: Three times a day (TID) | ORAL | 2 refills | Status: DC | PRN

## 2018-08-29 NOTE — Progress Notes (Signed)
Resent prescription for alprazolam to [facility] on [address].

## 2018-08-29 NOTE — Progress Notes (Signed)
Renewed patient's prescription for alprazolam 0.5mg  TID prn and sent to [facility].

## 2018-10-22 ENCOUNTER — Ambulatory Visit (INDEPENDENT_AMBULATORY_CARE_PROVIDER_SITE_OTHER): Payer: Medicare Other | Admitting: Nurse Practitioner

## 2018-10-22 ENCOUNTER — Encounter: Payer: Self-pay | Admitting: Nurse Practitioner

## 2018-10-22 ENCOUNTER — Other Ambulatory Visit: Payer: Self-pay

## 2018-10-22 VITALS — BP 128/80 | HR 78 | Resp 16 | Ht 60.0 in | Wt 106.0 lb

## 2018-10-22 DIAGNOSIS — I10 Essential (primary) hypertension: Secondary | ICD-10-CM | POA: Diagnosis not present

## 2018-10-22 DIAGNOSIS — F411 Generalized anxiety disorder: Secondary | ICD-10-CM

## 2018-10-22 DIAGNOSIS — G2581 Restless legs syndrome: Secondary | ICD-10-CM | POA: Diagnosis not present

## 2018-10-22 DIAGNOSIS — Z79899 Other long term (current) drug therapy: Secondary | ICD-10-CM

## 2018-10-22 LAB — POCT URINE DRUG SCREEN
POC Amphetamine UR: NOT DETECTED
POC BENZODIAZEPINES UR: POSITIVE — AB
POC Barbiturate UR: NOT DETECTED
POC Cocaine UR: NOT DETECTED
POC Ecstasy UR: NOT DETECTED
POC Marijuana UR: NOT DETECTED
POC Methadone UR: NOT DETECTED
POC Methamphetamine UR: NOT DETECTED
POC Opiate Ur: NOT DETECTED
POC Oxycodone UR: NOT DETECTED
POC PHENCYCLIDINE UR: NOT DETECTED
POC TRICYCLICS UR: NOT DETECTED

## 2018-10-22 MED ORDER — GABAPENTIN 100 MG PO CAPS: 100.0000 mg | ORAL_CAPSULE | Freq: Every day | ORAL | 1 refills | Status: DC

## 2018-10-22 MED ORDER — ATENOLOL 25 MG PO TABS: ORAL_TABLET | ORAL | 3 refills | Status: DC

## 2018-10-22 NOTE — Progress Notes (Signed)
[facility] [place], [place] [address]  Internal MEDICINE  Office Visit Note  Patient Name: Monica Collins  [id number]  [id number]  Date of Service: 10/22/2018  Chief Complaint  Patient presents with  . Hypertension    The patient states that she has been having trouble with restless legs. Hurting her at night, will wake her up from sleep. Has been going on for two or three weeks now. This has happened in the past. Had taken gabapentin 100mg  at night and this really helped the leg pain without negative side effects.       Current Medication: Outpatient Encounter Medications as of 10/22/2018  Medication Sig  . ALPRAZolam (XANAX) 0.5 MG tablet Take 1 tablet (0.5 mg total) by mouth 3 (three) times daily as needed for anxiety.  . Ascorbic Acid (VITAMIN C PO) Take by mouth.  Marland Kitchen aspirin 81 MG chewable tablet Chew by mouth daily.  Marland Kitchen atenolol (TENORMIN) 25 MG tablet Take 1 to 2 tablets po QD for HTN  . clotrimazole-betamethasone (LOTRISONE) cream Apply 1 application topically 2 (two) times daily.  . diclofenac sodium (VOLTAREN) 1 % GEL Apply 4 g topically 4 (four) times daily.  . diphenoxylate-atropine (LOMOTIL) 2.5-0.025 MG tablet Take 1 tablet by mouth 4 (four) times daily as needed for diarrhea or loose stools.  . Ginger, Zingiber officinalis, (GINGER EXTRACT) 250 MG CAPS Take 1 tablet by mouth.  . Multiple Vitamin (MULTIVITAMIN) capsule Take 1 capsule by mouth daily.  . ondansetron (ZOFRAN) 4 MG tablet Take 1 tablet (4 mg total) by mouth every 8 (eight) hours as needed for nausea or vomiting.  . triamcinolone (KENALOG) 0.025 % cream Apply 1 application topically 2 (two) times daily.  . [DISCONTINUED] atenolol (TENORMIN) 25 MG tablet Take 1 to 2 tablets po QD for HTN  . gabapentin (NEURONTIN) 100 MG capsule Take 1 capsule (100 mg total) by mouth at bedtime.  . metroNIDAZOLE (FLAGYL) 250 MG tablet Take 1 tablet (250 mg total) by mouth 2 (two) times daily.  (Patient not taking: Reported on 10/22/2018)  . [DISCONTINUED] ciprofloxacin (CIPRO) 250 MG tablet Take 1 tablet (250 mg total) by mouth 2 (two) times daily. (Patient not taking: Reported on 10/22/2018)  . [DISCONTINUED] phenazopyridine (PYRIDIUM) 200 MG tablet Take 1 tablet (200 mg total) by mouth 3 (three) times daily as needed for pain. (Patient not taking: Reported on 10/22/2018)   No facility-administered encounter medications on file as of 10/22/2018.     Surgical History: Past Surgical History:  Procedure Laterality Date  . CHOLECYSTECTOMY      Medical History: Past Medical History:  Diagnosis Date  . Hypertension     Family History: Family History  Problem Relation Age of Onset  . Cancer Sister   . Cancer Brother     Social History   Socioeconomic History  . Marital status: Married    Spouse name: Not on file  . Number of children: Not on file  . Years of education: Not on file  . Highest education level: Not on file  Occupational History  . Not on file  Social Needs  . Financial resource strain: Not on file  . Food insecurity    Worry: Not on file    Inability: Not on file  . Transportation needs    Medical: Not on file    Non-medical: Not on file  Tobacco Use  . Smoking status: Current Some Day Smoker    Packs/day: 12.00    Years:  50.00    Pack years: 600.00    Types: Cigarettes    Last attempt to quit: 10/21/2016    Years since quitting: 2.0  . Smokeless tobacco: Never Used  Substance and Sexual Activity  . Alcohol use: No    Comment: once a year  . Drug use: Never  . Sexual activity: Not on file  Lifestyle  . Physical activity    Days per week: Not on file    Minutes per session: Not on file  . Stress: Not on file  Relationships  . Social Herbalist on phone: Not on file    Gets together: Not on file    Attends religious service: Not on file    Active member of club or organization: Not on file    Attends meetings of clubs or  organizations: Not on file    Relationship status: Not on file  . Intimate partner violence    Fear of current or ex partner: Not on file    Emotionally abused: Not on file    Physically abused: Not on file    Forced sexual activity: Not on file  Other Topics Concern  . Not on file  Social History Narrative  . Not on file      Review of Systems  Constitutional: Negative for activity change, chills, fatigue and unexpected weight change.  HENT: Negative for congestion, postnasal drip, rhinorrhea, sneezing and sore throat.   Respiratory: Negative for cough, chest tightness, shortness of breath and wheezing.   Cardiovascular: Negative for chest pain and palpitations.       Elevated blood pressure today  Gastrointestinal: Positive for nausea. Negative for abdominal pain, constipation, diarrhea and vomiting.  Endocrine: Negative for cold intolerance, heat intolerance, polydipsia and polyuria.  Musculoskeletal: Positive for myalgias. Negative for arthralgias, back pain, joint swelling and neck pain.       Pain and cramping in the legs, especially at night.   Skin: Negative for rash.  Allergic/Immunologic: Negative for environmental allergies.  Neurological: Positive for weakness. Negative for dizziness, tremors, numbness and headaches.  Hematological: Negative for adenopathy. Does not bruise/bleed easily.  Psychiatric/Behavioral: Positive for dysphoric mood and sleep disturbance. Negative for behavioral problems (Depression) and suicidal ideas. The patient is nervous/anxious.        Improved since her last visit    Today's Vitals   10/22/18 1009  BP: 128/80  Pulse: 78  Resp: 16  SpO2: 98%  Weight: 106 lb (48.1 kg)  Height: 5' (1.524 m)   Body mass index is 20.7 kg/m.  Physical Exam Vitals signs and nursing note reviewed.  Constitutional:      General: She is not in acute distress.    Appearance: Normal appearance. She is well-developed. She is not diaphoretic.  HENT:      Head: Normocephalic and atraumatic.     Mouth/Throat:     Pharynx: No oropharyngeal exudate.  Eyes:     Conjunctiva/sclera: Conjunctivae normal.     Pupils: Pupils are equal, round, and reactive to light.  Neck:     Musculoskeletal: Normal range of motion and neck supple.     Thyroid: No thyromegaly.     Vascular: No carotid bruit or JVD.     Trachea: No tracheal deviation.  Cardiovascular:     Rate and Rhythm: Normal rate and regular rhythm.     Heart sounds: Normal heart sounds. No murmur. No friction rub. No gallop.   Pulmonary:     Effort:  Pulmonary effort is normal. No respiratory distress.     Breath sounds: Normal breath sounds. No wheezing or rales.  Chest:     Chest wall: No tenderness.     Breasts:        Right: No inverted nipple, mass, nipple discharge, skin change or tenderness.        Left: No inverted nipple, mass, nipple discharge, skin change or tenderness.  Abdominal:     General: Bowel sounds are normal.     Palpations: Abdomen is soft.     Tenderness: There is no abdominal tenderness.  Musculoskeletal: Normal range of motion.  Lymphadenopathy:     Cervical: No cervical adenopathy.  Skin:    General: Skin is warm and dry.     Capillary Refill: Capillary refill takes 2 to 3 seconds.  Neurological:     Mental Status: She is alert and oriented to person, place, and time.     Cranial Nerves: No cranial nerve deficit.     Comments: Patient is at neurological baseline.   Psychiatric:        Mood and Affect: Mood is anxious and depressed.        Speech: Speech normal.        Behavior: Behavior normal.        Thought Content: Thought content normal.        Judgment: Judgment normal.     Comments: Improved mood    Assessment/Plan: 1. Essential hypertension Stable. Continue blood pressure medication as prescribed.  - atenolol (TENORMIN) 25 MG tablet; Take 1 to 2 tablets po QD for HTN  Dispense: 60 tablet; Refill: 3  2. Restless legs syndrome (RLS) Start  gabapentin 100mg  at bedtime to help restless legs.  - gabapentin (NEURONTIN) 100 MG capsule; Take 1 capsule (100 mg total) by mouth at bedtime.  Dispense: 90 capsule; Refill: 1  3. Anxiety, generalized May continue to take alprazolam 0.5mg  three times daily if needed for anxiety. No new prescription needed today.   4. Encounter for long-term (current) use of high-risk medication - POCT Urine Drug Screen appropriately positive for B[facility] only.   General Counseling: Griselda verbalizes understanding of the findings of todays visit and agrees with plan of treatment. I have discussed any further diagnostic evaluation that may be needed or ordered today. We also reviewed her medications today. she has been encouraged to call the office with any questions or concerns that should arise related to todays visit.  Hypertension Counseling:   The following hypertensive lifestyle modification were recommended and discussed:  1. Limiting alcohol intake to less than 1 oz/day of ethanol:(24 oz of beer or 8 oz of wine or 2 oz of 100-proof whiskey). 2. Take baby ASA 81 mg daily. 3. Importance of regular aerobic exercise and losing weight. 4. Reduce dietary saturated fat and cholesterol intake for overall cardiovascular health. 5. Maintaining adequate dietary potassium, calcium, and magnesium intake. 6. Regular monitoring of the blood pressure. 7. Reduce sodium intake to less than 100 mmol/day (less than 2.3 gm of sodium or less than 6 gm of sodium choride)   This patient was seen by [place] with Dr Lavera Guise as a part of collaborative care agreement  Orders Placed This Encounter  Procedures  . POCT Urine Drug Screen    Meds ordered this encounter  Medications  . gabapentin (NEURONTIN) 100 MG capsule    Sig: Take 1 capsule (100 mg total) by mouth at bedtime.    Dispense:  90 capsule    Refill:  1    Order Specific Question:   Supervising Provider    Answer:   Lavera Guise  [[id number]]  . atenolol (TENORMIN) 25 MG tablet    Sig: Take 1 to 2 tablets po QD for HTN    Dispense:  60 tablet    Refill:  3    Order Specific Question:   Supervising Provider    Answer:   Lavera Guise [[id number]]    Time spent: 64 Minutes      Dr Lavera Guise Internal medicine

## 2018-11-26 DIAGNOSIS — C3432 Malignant neoplasm of lower lobe, left bronchus or lung: Secondary | ICD-10-CM | POA: Diagnosis not present

## 2018-11-27 DIAGNOSIS — C3490 Malignant neoplasm of unspecified part of unspecified bronchus or lung: Secondary | ICD-10-CM | POA: Diagnosis not present

## 2018-11-30 ENCOUNTER — Other Ambulatory Visit: Payer: Self-pay | Admitting: Nurse Practitioner

## 2018-11-30 DIAGNOSIS — F411 Generalized anxiety disorder: Secondary | ICD-10-CM

## 2018-11-30 MED ORDER — ALPRAZOLAM 0.5 MG PO TABS: 0.5000 mg | ORAL_TABLET | Freq: Three times a day (TID) | ORAL | 2 refills | Status: DC | PRN

## 2018-11-30 NOTE — Progress Notes (Signed)
Approved patient's prescription for alprazolam 0.5mg  up to three times daily as needed for acute anxiety. Sent to [facility] on [address].

## 2019-02-16 ENCOUNTER — Other Ambulatory Visit: Payer: Self-pay

## 2019-02-16 ENCOUNTER — Encounter: Payer: Self-pay | Admitting: Nurse Practitioner

## 2019-02-16 ENCOUNTER — Ambulatory Visit (INDEPENDENT_AMBULATORY_CARE_PROVIDER_SITE_OTHER): Payer: Medicare Other | Admitting: Nurse Practitioner

## 2019-02-16 VITALS — BP 142/66 | HR 53 | Temp 96.1°F | Resp 16 | Ht 60.0 in | Wt 109.0 lb

## 2019-02-16 DIAGNOSIS — Z0001 Encounter for general adult medical examination with abnormal findings: Secondary | ICD-10-CM | POA: Insufficient documentation

## 2019-02-16 DIAGNOSIS — R300 Dysuria: Secondary | ICD-10-CM

## 2019-02-16 DIAGNOSIS — F17210 Nicotine dependence, cigarettes, uncomplicated: Secondary | ICD-10-CM | POA: Diagnosis not present

## 2019-02-16 DIAGNOSIS — G2581 Restless legs syndrome: Secondary | ICD-10-CM | POA: Diagnosis not present

## 2019-02-16 DIAGNOSIS — I10 Essential (primary) hypertension: Secondary | ICD-10-CM

## 2019-02-16 DIAGNOSIS — Z1231 Encounter for screening mammogram for malignant neoplasm of breast: Secondary | ICD-10-CM | POA: Insufficient documentation

## 2019-02-16 DIAGNOSIS — F411 Generalized anxiety disorder: Secondary | ICD-10-CM

## 2019-02-16 DIAGNOSIS — F17209 Nicotine dependence, unspecified, with unspecified nicotine-induced disorders: Secondary | ICD-10-CM | POA: Insufficient documentation

## 2019-02-16 MED ORDER — ALPRAZOLAM 0.5 MG PO TABS: 0.5000 mg | ORAL_TABLET | Freq: Three times a day (TID) | ORAL | 2 refills | Status: DC | PRN

## 2019-02-16 MED ORDER — GABAPENTIN 100 MG PO CAPS: 100.0000 mg | ORAL_CAPSULE | Freq: Every day | ORAL | 1 refills | Status: DC

## 2019-02-16 MED ORDER — ATENOLOL 25 MG PO TABS: ORAL_TABLET | ORAL | 3 refills | Status: DC

## 2019-02-16 NOTE — Progress Notes (Signed)
[facility] [place], [place] [address]  Internal MEDICINE  Office Visit Note  Patient Name: Monica Collins  [id number]  [id number]  Date of Service: 02/16/2019   Pt is here for routine health maintenance examination  Chief Complaint  Patient presents with  . Annual Exam  . Hypertension  . Quality Metric Gaps    mammogram     The patient is here for health maintenance exam. Her blood pressure is well controlled. She is due to have check of routine, fasting labs. She is also due to have screening mammogram. She states that she is currently smoking most days. She smokes about 1/2 pack of cigarettes per day. She has history of lung cancer. We discussed the risks involved in her continuing to smoke. She voiced understanding. She states she has tried many different things to help with smoking cessation, but she has been unable to quit. She has no concerns or complaints today.     Current Medication: Outpatient Encounter Medications as of 02/16/2019  Medication Sig  . ALPRAZolam (XANAX) 0.5 MG tablet Take 1 tablet (0.5 mg total) by mouth 3 (three) times daily as needed for anxiety.  . Ascorbic Acid (VITAMIN C PO) Take by mouth.  Marland Kitchen aspirin 81 MG chewable tablet Chew by mouth daily.  Marland Kitchen atenolol (TENORMIN) 25 MG tablet Take 1 to 2 tablets po QD for HTN  . clotrimazole-betamethasone (LOTRISONE) cream Apply 1 application topically 2 (two) times daily.  . diclofenac sodium (VOLTAREN) 1 % GEL Apply 4 g topically 4 (four) times daily.  . diphenoxylate-atropine (LOMOTIL) 2.5-0.025 MG tablet Take 1 tablet by mouth 4 (four) times daily as needed for diarrhea or loose stools.  . gabapentin (NEURONTIN) 100 MG capsule Take 1 capsule (100 mg total) by mouth at bedtime.  . Ginger, Zingiber officinalis, (GINGER EXTRACT) 250 MG CAPS Take 1 tablet by mouth.  . Multiple Vitamin (MULTIVITAMIN) capsule Take 1 capsule by mouth daily.  . ondansetron (ZOFRAN) 4 MG tablet Take 1  tablet (4 mg total) by mouth every 8 (eight) hours as needed for nausea or vomiting.  . triamcinolone (KENALOG) 0.025 % cream Apply 1 application topically 2 (two) times daily.  . [DISCONTINUED] ALPRAZolam (XANAX) 0.5 MG tablet Take 1 tablet (0.5 mg total) by mouth 3 (three) times daily as needed for anxiety.  . [DISCONTINUED] atenolol (TENORMIN) 25 MG tablet Take 1 to 2 tablets po QD for HTN  . [DISCONTINUED] gabapentin (NEURONTIN) 100 MG capsule Take 1 capsule (100 mg total) by mouth at bedtime.  . [DISCONTINUED] metroNIDAZOLE (FLAGYL) 250 MG tablet Take 1 tablet (250 mg total) by mouth 2 (two) times daily. (Patient not taking: Reported on 10/22/2018)   No facility-administered encounter medications on file as of 02/16/2019.     Surgical History: Past Surgical History:  Procedure Laterality Date  . CHOLECYSTECTOMY      Medical History: Past Medical History:  Diagnosis Date  . Hypertension     Family History: Family History  Problem Relation Age of Onset  . Cancer Sister   . Cancer Brother       Review of Systems  Constitutional: Negative for activity change, chills, fatigue and unexpected weight change.  HENT: Negative for congestion, postnasal drip, rhinorrhea, sneezing and sore throat.   Respiratory: Negative for cough, chest tightness, shortness of breath and wheezing.   Cardiovascular: Negative for chest pain and palpitations.       Blood pressure is doing well.   Gastrointestinal: Positive for nausea.  Negative for abdominal pain, constipation, diarrhea and vomiting.  Endocrine: Negative for cold intolerance, heat intolerance, polydipsia and polyuria.  Musculoskeletal: Negative for arthralgias, back pain, joint swelling, myalgias and neck pain.  Skin: Negative for rash.  Allergic/Immunologic: Negative for environmental allergies.  Neurological: Positive for weakness. Negative for dizziness, tremors, numbness and headaches.  Hematological: Negative for adenopathy. Does  not bruise/bleed easily.  Psychiatric/Behavioral: Positive for dysphoric mood and sleep disturbance. Negative for behavioral problems (Depression) and suicidal ideas. The patient is nervous/anxious.        Improved since her last visit    Today's Vitals   02/16/19 1412  BP: (!) 142/66  Pulse: (!) 53  Resp: 16  Temp: (!) 96.1 F (35.6 C)  SpO2: 97%  Weight: 109 lb (49.4 kg)  Height: 5' (1.524 m)   Body mass index is 21.29 kg/m.  Physical Exam Vitals signs and nursing note reviewed.  Constitutional:      General: She is not in acute distress.    Appearance: Normal appearance. She is well-developed. She is not diaphoretic.  HENT:     Head: Normocephalic and atraumatic.     Mouth/Throat:     Pharynx: No oropharyngeal exudate.  Eyes:     Conjunctiva/sclera: Conjunctivae normal.     Pupils: Pupils are equal, round, and reactive to light.  Neck:     Musculoskeletal: Normal range of motion and neck supple.     Thyroid: No thyromegaly.     Vascular: No carotid bruit or JVD.     Trachea: No tracheal deviation.  Cardiovascular:     Rate and Rhythm: Normal rate and regular rhythm.     Pulses: Normal pulses.     Heart sounds: Normal heart sounds. No murmur. No friction rub. No gallop.   Pulmonary:     Effort: Pulmonary effort is normal. No respiratory distress.     Breath sounds: Normal breath sounds. No wheezing or rales.  Chest:     Chest wall: No tenderness.     Breasts:        Right: No inverted nipple, mass, nipple discharge, skin change or tenderness.        Left: No inverted nipple, mass, nipple discharge, skin change or tenderness.  Abdominal:     General: Bowel sounds are normal.     Palpations: Abdomen is soft.     Tenderness: There is no abdominal tenderness.  Musculoskeletal: Normal range of motion.  Lymphadenopathy:     Cervical: No cervical adenopathy.  Skin:    General: Skin is warm and dry.     Capillary Refill: Capillary refill takes 2 to 3 seconds.   Neurological:     Mental Status: She is alert and oriented to person, place, and time.     Cranial Nerves: No cranial nerve deficit.     Comments: Patient is at neurological baseline.   Psychiatric:        Mood and Affect: Mood is anxious and depressed.        Speech: Speech normal.        Behavior: Behavior normal.        Thought Content: Thought content normal.        Judgment: Judgment normal.     Comments: Improved mood    Depression screen [facility] 2/9 02/16/2019 10/22/2018 05/19/2018 04/24/2018 04/14/2018  Decreased Interest 0 - 0 0 0  Down, Depressed, Hopeless 0 0 0 0 0  PHQ - 2 Score 0 0 0 0 0  Functional Status Survey: Is the patient deaf or have difficulty hearing?: Yes Does the patient have difficulty seeing, even when wearing glasses/contacts?: No Does the patient have difficulty concentrating, remembering, or making decisions?: No Does the patient have difficulty walking or climbing stairs?: No Does the patient have difficulty dressing or bathing?: No Does the patient have difficulty doing errands alone such as visiting a doctor's office or shopping?: No  MMSE - [place] Exam 02/16/2019 02/12/2018  Orientation to time 5 4  Orientation to Place 5 5  Registration 3 3  Attention/ Calculation 5 5  Recall 3 3  Language- name 2 objects 2 2  Language- repeat 1 1  Language- follow 3 step command 3 3  Language- read & follow direction 1 1  Write a sentence 1 1  Copy design 1 1  Total score 30 29    Fall Risk  02/16/2019 10/22/2018 05/19/2018 04/24/2018 04/14/2018  Falls in the past year? 0 0 1 1 1   Comment - - - - -  Number falls in past yr: - - 0 0 0  Comment - - - - -  Injury with Fall? - - 0 0 0      Assessment/Plan: 1. Encounter for general adult medical examination with abnormal findings Annual health maintenance exam today  2. Essential hypertension Stable. Continue bp medication as prescribed  - atenolol (TENORMIN) 25 MG tablet; Take 1 to 2  tablets po QD for HTN  Dispense: 180 tablet; Refill: 3  3. Restless legs syndrome (RLS) Gabapentin 100mg  at bedtime as needed  - gabapentin (NEURONTIN) 100 MG capsule; Take 1 capsule (100 mg total) by mouth at bedtime.  Dispense: 90 capsule; Refill: 1  4. Anxiety, generalized May take alprazolam 0.5mg  up to three times daily if needed for acute anxiety. New prescription sent to her pharmacy - ALPRAZolam (XANAX) 0.5 MG tablet; Take 1 tablet (0.5 mg total) by mouth 3 (three) times daily as needed for anxiety.  Dispense: 90 tablet; Refill: 2  5. Cigarette smoker Discussed risks of continued smoking, especially with history of lung cancer and CVA. She is not ready to quit at this time.   6. Encounter for screening mammogram for malignant neoplasm of breast - MM DIGITAL SCREENING BILATERAL; Future  7. Dysuria - UA/M w/rflx Culture, Routine   General Counseling: Carmencita verbalizes understanding of the findings of todays visit and agrees with plan of treatment. I have discussed any further diagnostic evaluation that may be needed or ordered today. We also reviewed her medications today. she has been encouraged to call the office with any questions or concerns that should arise related to todays visit.    Counseling:  Hypertension Counseling:   The following hypertensive lifestyle modification were recommended and discussed:  1. Limiting alcohol intake to less than 1 oz/day of ethanol:(24 oz of beer or 8 oz of wine or 2 oz of 100-proof whiskey). 2. Take baby ASA 81 mg daily. 3. Importance of regular aerobic exercise and losing weight. 4. Reduce dietary saturated fat and cholesterol intake for overall cardiovascular health. 5. Maintaining adequate dietary potassium, calcium, and magnesium intake. 6. Regular monitoring of the blood pressure. 7. Reduce sodium intake to less than 100 mmol/day (less than 2.3 gm of sodium or less than 6 gm of sodium choride)   This patient was seen by [place] with Dr Lavera Guise as a part of collaborative care agreement  Orders Placed This Encounter  Procedures  .  MM DIGITAL SCREENING BILATERAL  . UA/M w/rflx Culture, Routine    Meds ordered this encounter  Medications  . ALPRAZolam (XANAX) 0.5 MG tablet    Sig: Take 1 tablet (0.5 mg total) by mouth 3 (three) times daily as needed for anxiety.    Dispense:  90 tablet    Refill:  2    Order Specific Question:   Supervising Provider    Answer:   Lavera Guise [[id number]]  . atenolol (TENORMIN) 25 MG tablet    Sig: Take 1 to 2 tablets po QD for HTN    Dispense:  180 tablet    Refill:  3    Order Specific Question:   Supervising Provider    Answer:   Lavera Guise [place]  . gabapentin (NEURONTIN) 100 MG capsule    Sig: Take 1 capsule (100 mg total) by mouth at bedtime.    Dispense:  90 capsule    Refill:  1    Order Specific Question:   Supervising Provider    Answer:   Lavera Guise [[id number]]    Time spent: [place], MD  Internal Medicine

## 2019-02-17 NOTE — Progress Notes (Signed)
Waiting on urine culture results.

## 2019-02-18 LAB — URINE CULTURE, REFLEX

## 2019-02-18 LAB — UA/M W/RFLX CULTURE, ROUTINE
Bilirubin, UA: NEGATIVE
Glucose, UA: NEGATIVE
Ketones, UA: NEGATIVE
Nitrite, UA: NEGATIVE
Protein,UA: NEGATIVE
RBC, UA: NEGATIVE
Specific Gravity, UA: 1.011 (ref 1.005–1.030)
Urobilinogen, Ur: 0.2 mg/dL (ref 0.2–1.0)
pH, UA: 6 (ref 5.0–7.5)

## 2019-02-18 LAB — MICROSCOPIC EXAMINATION
Casts: NONE SEEN /lpf
Epithelial Cells (non renal): >10 /hpf — AB (ref 0–10)

## 2019-02-18 NOTE — Progress Notes (Signed)
Considered contaminiated. No treatment recommended.

## 2019-05-18 ENCOUNTER — Other Ambulatory Visit: Payer: Self-pay | Admitting: Nurse Practitioner

## 2019-05-18 DIAGNOSIS — F411 Generalized anxiety disorder: Secondary | ICD-10-CM

## 2019-05-18 MED ORDER — ALPRAZOLAM 0.5 MG PO TABS: 0.5000 mg | ORAL_TABLET | Freq: Three times a day (TID) | ORAL | 3 refills | Status: DC | PRN

## 2019-05-18 NOTE — Progress Notes (Signed)
Renewed prescription for alprazlam per patient's request and sent to [facility]. She has appontment last week January, 2021.

## 2019-05-20 ENCOUNTER — Telehealth: Payer: Self-pay

## 2019-05-20 ENCOUNTER — Ambulatory Visit: Payer: No Typology Code available for payment source | Admitting: Adult Health

## 2019-05-20 NOTE — Telephone Encounter (Signed)
CONFIRMED 05-24-19 OV AS VIRTUAL °

## 2019-05-24 ENCOUNTER — Encounter: Payer: Self-pay | Admitting: Nurse Practitioner

## 2019-05-24 ENCOUNTER — Ambulatory Visit (INDEPENDENT_AMBULATORY_CARE_PROVIDER_SITE_OTHER): Payer: Medicare Other | Admitting: Nurse Practitioner

## 2019-05-24 ENCOUNTER — Other Ambulatory Visit: Payer: Self-pay

## 2019-05-24 VITALS — BP 157/62 | HR 69 | Ht 60.0 in | Wt 106.0 lb

## 2019-05-24 DIAGNOSIS — F411 Generalized anxiety disorder: Secondary | ICD-10-CM | POA: Diagnosis not present

## 2019-05-24 DIAGNOSIS — G40909 Epilepsy, unspecified, not intractable, without status epilepticus: Secondary | ICD-10-CM

## 2019-05-24 DIAGNOSIS — I10 Essential (primary) hypertension: Secondary | ICD-10-CM

## 2019-05-24 MED ORDER — ALPRAZOLAM 0.5 MG PO TABS: ORAL_TABLET | ORAL | 2 refills | Status: DC

## 2019-05-24 NOTE — Progress Notes (Signed)
[facility] [place], [place] [address]  Internal MEDICINE  Telephone Visit  Patient Name: Monica Collins  [id number]  [id number]  Date of Service: 05/24/2019  I connected with the patient at 4:04pm by telephone and verified the patients identity using two identifiers.   I discussed the limitations, risks, security and privacy concerns of performing an evaluation and management service by telephone and the availability of in person appointments. I also discussed with the patient that there may be a patient responsible charge related to the service.  The patient expressed understanding and agrees to proceed.    Chief Complaint  Patient presents with  . Telephone Assessment  . Telephone Screen  . Hypertension  . Anxiety    The patient has been contacted via telephone for follow up visit due to concerns for spread of novel coronavirus. The patient presents for follow up. The patient states that she is having some increased stress due to isolation from [place]. She sates that she is not comfortable with the "virtual world" but he is making due. Her blood pressure is mildly elevated today. She states that she also had some increased stress due to family concerns. Her brother recently passed away. He had cancer all through his body and passed away quickly and unexpectedly. She did have to take an extra alprazolam on some of these days which helped her tremendously. She states that when she did hav increased stress, she experienced "seizure-like" activity. She becomes numb, void of expression, she does not understand what is going on around her and is unable to communicate. Usually lasts for only a few seconds, not longer than 30 seconds. If she took extra alprazolam, she would not have activity.       Current Medication: Outpatient Encounter Medications as of 05/24/2019  Medication Sig  . ALPRAZolam (XANAX) 0.5 MG tablet Take 1 tablet po TID prn anxiety. May take 4th  dose po QD prn severe anxiety.  . Ascorbic Acid (VITAMIN C PO) Take by mouth.  Marland Kitchen aspirin 81 MG chewable tablet Chew by mouth daily.  Marland Kitchen atenolol (TENORMIN) 25 MG tablet Take 1 to 2 tablets po QD for HTN  . clotrimazole-betamethasone (LOTRISONE) cream Apply 1 application topically 2 (two) times daily.  . diclofenac sodium (VOLTAREN) 1 % GEL Apply 4 g topically 4 (four) times daily.  . diphenoxylate-atropine (LOMOTIL) 2.5-0.025 MG tablet Take 1 tablet by mouth 4 (four) times daily as needed for diarrhea or loose stools.  . gabapentin (NEURONTIN) 100 MG capsule Take 1 capsule (100 mg total) by mouth at bedtime.  . Ginger, Zingiber officinalis, (GINGER EXTRACT) 250 MG CAPS Take 1 tablet by mouth.  . Multiple Vitamin (MULTIVITAMIN) capsule Take 1 capsule by mouth daily.  . ondansetron (ZOFRAN) 4 MG tablet Take 1 tablet (4 mg total) by mouth every 8 (eight) hours as needed for nausea or vomiting.  . triamcinolone (KENALOG) 0.025 % cream Apply 1 application topically 2 (two) times daily.  . [DISCONTINUED] ALPRAZolam (XANAX) 0.5 MG tablet Take 1 tablet (0.5 mg total) by mouth 3 (three) times daily as needed for anxiety.   No facility-administered encounter medications on file as of 05/24/2019.    Surgical History: Past Surgical History:  Procedure Laterality Date  . CHOLECYSTECTOMY      Medical History: Past Medical History:  Diagnosis Date  . Hypertension     Family History: Family History  Problem Relation Age of Onset  . Cancer Sister   . Cancer Brother  Social History   Socioeconomic History  . Marital status: Married    Spouse name: Not on file  . Number of children: Not on file  . Years of education: Not on file  . Highest education level: Not on file  Occupational History  . Not on file  Tobacco Use  . Smoking status: Current Some Day Smoker    Packs/day: 12.00    Years: 50.00    Pack years: 600.00    Types: Cigarettes    Last attempt to quit: 10/21/2016    Years  since quitting: 2.5  . Smokeless tobacco: Former Systems developer    Types: Snuff  . Tobacco comment: 10 CIGARETTES A DAY   Substance and Sexual Activity  . Alcohol use: No    Comment: once a year  . Drug use: Never  . Sexual activity: Not on file  Other Topics Concern  . Not on file  Social History Narrative  . Not on file   Social Determinants of Health   Financial Resource Strain:   . Difficulty of Paying Living Expenses: Not on file  Food Insecurity:   . Worried About Charity fundraiser in the Last Year: Not on file  . Ran Out of Food in the Last Year: Not on file  Transportation Needs:   . Lack of Transportation (Medical): Not on file  . Lack of Transportation (Non-Medical): Not on file  Physical Activity:   . Days of Exercise per Week: Not on file  . Minutes of Exercise per Session: Not on file  Stress:   . Feeling of Stress : Not on file  Social Connections:   . Frequency of Communication with Friends and Family: Not on file  . Frequency of Social Gatherings with Friends and Family: Not on file  . Attends Religious Services: Not on file  . Active Member of Clubs or Organizations: Not on file  . Attends Archivist Meetings: Not on file  . Marital Status: Not on file  Intimate Partner Violence:   . Fear of Current or Ex-Partner: Not on file  . Emotionally Abused: Not on file  . Physically Abused: Not on file  . Sexually Abused: Not on file      Review of Systems  Constitutional: Negative for activity change, chills, fatigue and unexpected weight change.  HENT: Negative for congestion, postnasal drip, rhinorrhea, sneezing and sore throat.   Respiratory: Negative for cough, chest tightness, shortness of breath and wheezing.   Cardiovascular: Negative for chest pain and palpitations.       Blood pressure is doing well.   Gastrointestinal: Positive for nausea. Negative for abdominal pain, constipation, diarrhea and vomiting.  Endocrine: Negative for cold  intolerance, heat intolerance, polydipsia and polyuria.  Musculoskeletal: Negative for arthralgias, back pain, joint swelling, myalgias and neck pain.  Skin: Negative for rash.  Allergic/Immunologic: Negative for environmental allergies.  Neurological: Positive for seizures and weakness. Negative for dizziness, tremors, numbness and headaches.  Hematological: Negative for adenopathy. Does not bruise/bleed easily.  Psychiatric/Behavioral: Positive for dysphoric mood and sleep disturbance. Negative for behavioral problems (Depression) and suicidal ideas. The patient is nervous/anxious.        Improved since her last visit    Today's Vitals   05/24/19 1539  BP: (!) 157/62  Pulse: 69  Weight: 106 lb (48.1 kg)  Height: 5' (1.524 m)    Observation/Objective:   The patient is alert and oriented. She is pleasant and answers all questions appropriately. Breathing is non-labored.  She is in no acute distress at this time. She sounds worried and a bit upset on the telephone.    Assessment/Plan: 1. Essential hypertension Generally stable. Continue bp medication as prescribed .  2. Anxiety, generalized Increased dosing of alprazolam 0.5. May take a fourth dose of alprazolam as needed for days when she is experiencing severe stress and anxiety.  - ALPRAZolam (XANAX) 0.5 MG tablet; Take 1 tablet po TID prn anxiety. May take 4th dose po QD prn severe anxiety.  Dispense: 105 tablet; Refill: 2  3. Seizure disorder ([place]) Potential petit absence seizure activity. Discussed new referral to neurology if symptoms persist or worsen. She agrees with this plan.   General Counseling: Chastidy verbalizes understanding of the findings of today's phone visit and agrees with plan of treatment. I have discussed any further diagnostic evaluation that may be needed or ordered today. We also reviewed her medications today. she has been encouraged to call the office with any questions or concerns that should arise  related to todays visit.  Hypertension Counseling:   The following hypertensive lifestyle modification were recommended and discussed:  1. Limiting alcohol intake to less than 1 oz/day of ethanol:(24 oz of beer or 8 oz of wine or 2 oz of 100-proof whiskey). 2. Take baby ASA 81 mg daily. 3. Importance of regular aerobic exercise and losing weight. 4. Reduce dietary saturated fat and cholesterol intake for overall cardiovascular health. 5. Maintaining adequate dietary potassium, calcium, and magnesium intake. 6. Regular monitoring of the blood pressure. 7. Reduce sodium intake to less than 100 mmol/day (less than 2.3 gm of sodium or less than 6 gm of sodium choride)   This patient was seen by [place] with Dr Lavera Guise as a part of collaborative care agreement  Meds ordered this encounter  Medications  . ALPRAZolam (XANAX) 0.5 MG tablet    Sig: Take 1 tablet po TID prn anxiety. May take 4th dose po QD prn severe anxiety.    Dispense:  105 tablet    Refill:  2    Please note that I added 4th dose on days with severe anxiety.    Order Specific Question:   Supervising Provider    Answer:   Lavera Guise [[id number]]    Time spent: 66 Minutes    Dr Lavera Guise Internal medicine

## 2019-06-08 DIAGNOSIS — Z902 Acquired absence of lung [part of]: Secondary | ICD-10-CM | POA: Diagnosis not present

## 2019-06-08 DIAGNOSIS — C3432 Malignant neoplasm of lower lobe, left bronchus or lung: Secondary | ICD-10-CM | POA: Diagnosis not present

## 2019-06-08 DIAGNOSIS — Z9889 Other specified postprocedural states: Secondary | ICD-10-CM | POA: Diagnosis not present

## 2019-06-08 DIAGNOSIS — F17210 Nicotine dependence, cigarettes, uncomplicated: Secondary | ICD-10-CM | POA: Diagnosis not present

## 2019-06-25 ENCOUNTER — Other Ambulatory Visit: Payer: Self-pay | Admitting: Nurse Practitioner

## 2019-06-25 DIAGNOSIS — F17210 Nicotine dependence, cigarettes, uncomplicated: Secondary | ICD-10-CM

## 2019-06-25 MED ORDER — NICOTINE 14 MG/24HR TD PT24: 14.0000 mg | MEDICATED_PATCH | Freq: Every day | TRANSDERMAL | 1 refills | Status: DC

## 2019-06-25 NOTE — Progress Notes (Signed)
Patient enrlled in [facility] tobacco cessation program.  Sent prescription for nicotine patches 14mg /day. Patient understands how to use them and will contact me if there are any problems.

## 2019-07-26 ENCOUNTER — Other Ambulatory Visit: Payer: Self-pay | Admitting: Nurse Practitioner

## 2019-07-26 DIAGNOSIS — N390 Urinary tract infection, site not specified: Secondary | ICD-10-CM

## 2019-07-26 MED ORDER — CIPROFLOXACIN HCL 500 MG PO TABS: 500.0000 mg | ORAL_TABLET | Freq: Two times a day (BID) | ORAL | 0 refills | Status: DC

## 2019-07-26 NOTE — Progress Notes (Signed)
Sent cipro 500mg  bid for 7 days to [facility] [address].due to uti type symptoms

## 2019-07-28 ENCOUNTER — Other Ambulatory Visit: Payer: Self-pay | Admitting: Nurse Practitioner

## 2019-07-28 DIAGNOSIS — N390 Urinary tract infection, site not specified: Secondary | ICD-10-CM

## 2019-07-28 MED ORDER — CEPHALEXIN 250 MG PO CAPS: 250.0000 mg | ORAL_CAPSULE | Freq: Three times a day (TID) | ORAL | 0 refills | Status: DC

## 2019-07-28 NOTE — Progress Notes (Signed)
Changed cipro to cephalexin 250mg  three times daily for next 10 days. Sent to [facility] [address].

## 2019-08-09 ENCOUNTER — Other Ambulatory Visit: Payer: Self-pay | Admitting: Nurse Practitioner

## 2019-08-09 DIAGNOSIS — B379 Candidiasis, unspecified: Secondary | ICD-10-CM

## 2019-08-09 MED ORDER — FLUCONAZOLE 150 MG PO TABS: ORAL_TABLET | ORAL | 0 refills | Status: DC

## 2019-08-09 NOTE — Progress Notes (Signed)
Patient c/o yeast infection after treatment with antibiotics. Sent diflucan to [facility]. Take once. May repeat in three days for persistent symptoms.

## 2019-09-01 ENCOUNTER — Other Ambulatory Visit: Payer: Self-pay | Admitting: Nurse Practitioner

## 2019-09-01 DIAGNOSIS — F411 Generalized anxiety disorder: Secondary | ICD-10-CM

## 2019-09-01 MED ORDER — ALPRAZOLAM 0.5 MG PO TABS: ORAL_TABLET | ORAL | 2 refills | Status: DC

## 2019-09-01 NOTE — Progress Notes (Signed)
Refilled prescription for alprazolam TID prn and sent to [facility] [address] per patient request.

## 2019-09-16 ENCOUNTER — Telehealth: Payer: Self-pay

## 2019-09-16 NOTE — Telephone Encounter (Signed)
Confirmed and screened for 09-21-19 ov.

## 2019-09-21 ENCOUNTER — Other Ambulatory Visit: Payer: Self-pay

## 2019-09-21 ENCOUNTER — Ambulatory Visit (INDEPENDENT_AMBULATORY_CARE_PROVIDER_SITE_OTHER): Payer: Medicare Other | Admitting: Nurse Practitioner

## 2019-09-21 ENCOUNTER — Encounter: Payer: Self-pay | Admitting: Nurse Practitioner

## 2019-09-21 VITALS — BP 146/71 | HR 72 | Temp 97.3°F | Resp 16 | Ht 60.0 in | Wt 105.6 lb

## 2019-09-21 DIAGNOSIS — G40909 Epilepsy, unspecified, not intractable, without status epilepticus: Secondary | ICD-10-CM | POA: Diagnosis not present

## 2019-09-21 DIAGNOSIS — Z9889 Other specified postprocedural states: Secondary | ICD-10-CM

## 2019-09-21 DIAGNOSIS — F411 Generalized anxiety disorder: Secondary | ICD-10-CM

## 2019-09-21 DIAGNOSIS — I10 Essential (primary) hypertension: Secondary | ICD-10-CM

## 2019-09-21 NOTE — Progress Notes (Signed)
[facility] [place], [place] [address]  Internal MEDICINE  Office Visit Note  Patient Name: Monica Collins  [id number]  [id number]  Date of Service: 09/26/2019  Chief Complaint  Patient presents with  . Hypertension    The patient presents for follow up visit. she continues to experience "seizure-like" activity. She becomes numb, void of expression, she does not understand what is going on around her and is unable to communicate. Usually lasts for only a few seconds, not longer than 30 seconds. She states that these absent type seizure have become less frequent. However, she has episodes of panic with racing heart. These come out of nowhere. These new symptoms effect her memory. She does have history of cerebral aneurysm.  She is concerned that where the aneurysm was may be contributing to these new symptoms and making them more severe. She is currently not taking any anti-seizure medications at this time. She had been on keppra for some time after having surgery to repair the aneurysm. She had many negative side effects from keppra and gradually weaned herself off of this. New symptoms have become more severe the longer she is off the anti seizure medication. We discussed this back in January, 2021. We agreed that, if symptoms worsened or persisted, she would see new neurologist. She denies headaches. She does state that she has trouble with her belly, going between diarrhea and constipation. She has not had a colonoscopy.       Current Medication: Outpatient Encounter Medications as of 09/21/2019  Medication Sig  . ALPRAZolam (XANAX) 0.5 MG tablet Take 1 tablet po TID prn anxiety. May take 4th dose po QD prn severe anxiety.  . Ascorbic Acid (VITAMIN C PO) Take by mouth.  Marland Kitchen aspirin 81 MG chewable tablet Chew by mouth daily.  Marland Kitchen atenolol (TENORMIN) 25 MG tablet Take 1 to 2 tablets po QD for HTN  . clotrimazole-betamethasone (LOTRISONE) cream Apply 1 application  topically 2 (two) times daily.  . diclofenac sodium (VOLTAREN) 1 % GEL Apply 4 g topically 4 (four) times daily.  . diphenoxylate-atropine (LOMOTIL) 2.5-0.025 MG tablet Take 1 tablet by mouth 4 (four) times daily as needed for diarrhea or loose stools.  . fluconazole (DIFLUCAN) 150 MG tablet Take 1 tablet po once. May repeat dose in 3 days as needed for persistent symptoms.  Marland Kitchen gabapentin (NEURONTIN) 100 MG capsule Take 1 capsule (100 mg total) by mouth at bedtime.  . Ginger, Zingiber officinalis, (GINGER EXTRACT) 250 MG CAPS Take 1 tablet by mouth.  . Multiple Vitamin (MULTIVITAMIN) capsule Take 1 capsule by mouth daily.  . nicotine (NICODERM CQ - DOSED IN MG/24 HOURS) 14 mg/24hr patch Place 1 patch (14 mg total) onto the skin daily.  . ondansetron (ZOFRAN) 4 MG tablet Take 1 tablet (4 mg total) by mouth every 8 (eight) hours as needed for nausea or vomiting.  . triamcinolone (KENALOG) 0.025 % cream Apply 1 application topically 2 (two) times daily.  . [DISCONTINUED] cephALEXin (KEFLEX) 250 MG capsule Take 1 capsule (250 mg total) by mouth 3 (three) times daily. (Patient not taking: Reported on 09/21/2019)   No facility-administered encounter medications on file as of 09/21/2019.    Surgical History: Past Surgical History:  Procedure Laterality Date  . CHOLECYSTECTOMY      Medical History: Past Medical History:  Diagnosis Date  . Hypertension     Family History: Family History  Problem Relation Age of Onset  . Cancer Sister   . Cancer  Brother     Social History   Socioeconomic History  . Marital status: Married    Spouse name: Not on file  . Number of children: Not on file  . Years of education: Not on file  . Highest education level: Not on file  Occupational History  . Not on file  Tobacco Use  . Smoking status: Current Some Day Smoker    Packs/day: 12.00    Years: 50.00    Pack years: 600.00    Types: Cigarettes    Last attempt to quit: 10/21/2016    Years since  quitting: 2.9  . Smokeless tobacco: Never Used  . Tobacco comment: 10 CIGARETTES A DAY   Substance and Sexual Activity  . Alcohol use: No    Comment: once a year  . Drug use: Never  . Sexual activity: Not on file  Other Topics Concern  . Not on file  Social History Narrative  . Not on file   Social Determinants of Health   Financial Resource Strain:   . Difficulty of Paying Living Expenses:   Food Insecurity:   . Worried About Charity fundraiser in the Last Year:   . Arboriculturist in the Last Year:   Transportation Needs:   . Film/video editor (Medical):   Marland Kitchen Lack of Transportation (Non-Medical):   Physical Activity:   . Days of Exercise per Week:   . Minutes of Exercise per Session:   Stress:   . Feeling of Stress :   Social Connections:   . Frequency of Communication with Friends and Family:   . Frequency of Social Gatherings with Friends and Family:   . Attends Religious Services:   . Active Member of Clubs or Organizations:   . Attends Archivist Meetings:   Marland Kitchen Marital Status:   Intimate Partner Violence:   . Fear of Current or Ex-Partner:   . Emotionally Abused:   Marland Kitchen Physically Abused:   . Sexually Abused:       Review of Systems  Constitutional: Negative for activity change, chills, fatigue and unexpected weight change.  HENT: Negative for congestion, postnasal drip, rhinorrhea, sneezing and sore throat.   Respiratory: Negative for cough, chest tightness, shortness of breath and wheezing.   Cardiovascular: Negative for chest pain and palpitations.       Blood pressure is generally well managed with current medication.   Gastrointestinal: Positive for nausea. Negative for abdominal pain, constipation, diarrhea and vomiting.  Endocrine: Negative for cold intolerance, heat intolerance, polydipsia and polyuria.  Musculoskeletal: Negative for arthralgias, back pain, joint swelling, myalgias and neck pain.  Skin: Negative for rash.   Allergic/Immunologic: Negative for environmental allergies.  Neurological: Positive for seizures and weakness. Negative for dizziness, tremors, numbness and headaches.       Worsening symptoms.   Hematological: Negative for adenopathy. Does not bruise/bleed easily.  Psychiatric/Behavioral: Positive for dysphoric mood and sleep disturbance. Negative for behavioral problems (Depression) and suicidal ideas. The patient is nervous/anxious.        Improved since her last visit    Today's Vitals   09/21/19 1505  BP: (!) 146/71  Pulse: 72  Resp: 16  Temp: (!) 97.3 F (36.3 C)  SpO2: 95%  Weight: 105 lb 9.6 oz (47.9 kg)  Height: 5' (1.524 m)   Body mass index is 20.62 kg/m.  Physical Exam Vitals and nursing note reviewed.  Constitutional:      General: She is not in acute distress.  Appearance: Normal appearance. She is well-developed. She is not diaphoretic.  HENT:     Head: Normocephalic and atraumatic.     Nose: Nose normal.     Mouth/Throat:     Pharynx: No oropharyngeal exudate.  Eyes:     Pupils: Pupils are equal, round, and reactive to light.  Neck:     Thyroid: No thyromegaly.     Vascular: No JVD.     Trachea: No tracheal deviation.  Cardiovascular:     Rate and Rhythm: Normal rate and regular rhythm.     Heart sounds: Normal heart sounds. No murmur. No friction rub. No gallop.   Pulmonary:     Effort: Pulmonary effort is normal. No respiratory distress.     Breath sounds: Normal breath sounds. No wheezing or rales.  Chest:     Chest wall: No tenderness.  Abdominal:     Palpations: Abdomen is soft.  Musculoskeletal:        General: Normal range of motion.     Cervical back: Normal range of motion and neck supple.  Lymphadenopathy:     Cervical: No cervical adenopathy.  Skin:    General: Skin is warm and dry.  Neurological:     Mental Status: She is alert and oriented to person, place, and time. Mental status is at baseline.     Cranial Nerves: No cranial  nerve deficit.     Motor: Weakness present.  Psychiatric:        Attention and Perception: Attention and perception normal.        Mood and Affect: Affect normal. Mood is anxious and depressed.        Speech: Speech normal.        Behavior: Behavior normal. Behavior is cooperative.        Thought Content: Thought content normal.        Cognition and Memory: Cognition and memory normal.        Judgment: Judgment normal.    Assessment/Plan: 1. Essential hypertension Generally stable. Continue bp medication as prescribed   2. Seizure disorder ([place]) Symptoms worsening. Refer to neurology for further evaluation.  - Ambulatory referral to Neurology  3. History of surgery for cerebral aneurysm Refer to neurology for further evaluation.  - Ambulatory referral to Neurology  4. Generalized anxiety disorder May continue alprazolam 0.5mg  as needed and as prescribed.   General Counseling: Shadell verbalizes understanding of the findings of todays visit and agrees with plan of treatment. I have discussed any further diagnostic evaluation that may be needed or ordered today. We also reviewed her medications today. she has been encouraged to call the office with any questions or concerns that should arise related to todays visit.  This patient was seen by Leretha Pol FNP Collaboration with Dr Lavera Guise as a part of collaborative care agreement  Orders Placed This Encounter  Procedures  . Ambulatory referral to Neurology     Total time spent: 30 Minutes   Time spent includes review of chart, medications, test results, and follow up plan with the patient.      Dr Lavera Guise Internal medicine

## 2019-10-12 ENCOUNTER — Other Ambulatory Visit: Payer: Self-pay | Admitting: Nurse Practitioner

## 2019-10-12 DIAGNOSIS — B379 Candidiasis, unspecified: Secondary | ICD-10-CM

## 2019-10-12 MED ORDER — FLUCONAZOLE 150 MG PO TABS: ORAL_TABLET | ORAL | 5 refills | Status: DC

## 2019-10-12 NOTE — Progress Notes (Signed)
Patient having issues with GERD and stomach upset since being on antibiotics for uti. Will have her start diflucan 150mg  weekly for next several months. If she is developing cramping in diarrhea, will consider c. Diff as possible diagnosis and will send for testing. Patient to contact me with progress.

## 2019-10-13 ENCOUNTER — Telehealth: Payer: Self-pay

## 2019-10-25 ENCOUNTER — Other Ambulatory Visit: Payer: Self-pay | Admitting: Acute Care

## 2019-10-25 DIAGNOSIS — Z87898 Personal history of other specified conditions: Secondary | ICD-10-CM | POA: Insufficient documentation

## 2019-10-25 DIAGNOSIS — R569 Unspecified convulsions: Secondary | ICD-10-CM | POA: Diagnosis not present

## 2019-10-25 DIAGNOSIS — I671 Cerebral aneurysm, nonruptured: Secondary | ICD-10-CM

## 2019-10-27 NOTE — Telephone Encounter (Signed)
Send message to [employer]

## 2019-11-10 ENCOUNTER — Ambulatory Visit
Admission: RE | Admit: 2019-11-10 | Discharge: 2019-11-10 | Disposition: A | Discharge status: Home / Self Care | Payer: Medicare Other | Source: Ambulatory Visit | Attending: Acute Care | Admitting: Acute Care

## 2019-11-10 ENCOUNTER — Other Ambulatory Visit: Payer: Self-pay

## 2019-11-10 DIAGNOSIS — I671 Cerebral aneurysm, nonruptured: Secondary | ICD-10-CM | POA: Diagnosis not present

## 2019-12-03 DIAGNOSIS — Z87898 Personal history of other specified conditions: Secondary | ICD-10-CM | POA: Diagnosis not present

## 2019-12-03 DIAGNOSIS — I671 Cerebral aneurysm, nonruptured: Secondary | ICD-10-CM | POA: Diagnosis not present

## 2019-12-10 DIAGNOSIS — R569 Unspecified convulsions: Secondary | ICD-10-CM | POA: Diagnosis not present

## 2019-12-17 ENCOUNTER — Telehealth: Payer: Self-pay

## 2019-12-17 NOTE — Telephone Encounter (Signed)
Lmom to confirm and screen for 12-21-19 ov. 

## 2019-12-20 ENCOUNTER — Other Ambulatory Visit: Payer: Self-pay | Admitting: Nurse Practitioner

## 2019-12-20 DIAGNOSIS — F411 Generalized anxiety disorder: Secondary | ICD-10-CM

## 2019-12-20 MED ORDER — ALPRAZOLAM 0.5 MG PO TABS: ORAL_TABLET | ORAL | 3 refills | Status: DC

## 2019-12-21 ENCOUNTER — Ambulatory Visit: Payer: No Typology Code available for payment source | Admitting: Nurse Practitioner

## 2019-12-21 DIAGNOSIS — R569 Unspecified convulsions: Secondary | ICD-10-CM | POA: Diagnosis not present

## 2019-12-29 DIAGNOSIS — Z87898 Personal history of other specified conditions: Secondary | ICD-10-CM | POA: Diagnosis not present

## 2019-12-29 DIAGNOSIS — R569 Unspecified convulsions: Secondary | ICD-10-CM | POA: Diagnosis not present

## 2019-12-29 DIAGNOSIS — I671 Cerebral aneurysm, nonruptured: Secondary | ICD-10-CM | POA: Diagnosis not present

## 2020-01-06 DIAGNOSIS — R569 Unspecified convulsions: Secondary | ICD-10-CM | POA: Diagnosis not present

## 2020-01-06 DIAGNOSIS — I671 Cerebral aneurysm, nonruptured: Secondary | ICD-10-CM | POA: Diagnosis not present

## 2020-01-06 DIAGNOSIS — R000 Tachycardia, unspecified: Secondary | ICD-10-CM | POA: Diagnosis not present

## 2020-01-06 DIAGNOSIS — R002 Palpitations: Secondary | ICD-10-CM | POA: Insufficient documentation

## 2020-01-06 DIAGNOSIS — Z87898 Personal history of other specified conditions: Secondary | ICD-10-CM | POA: Diagnosis not present

## 2020-01-06 DIAGNOSIS — I639 Cerebral infarction, unspecified: Secondary | ICD-10-CM | POA: Diagnosis not present

## 2020-01-12 DIAGNOSIS — H903 Sensorineural hearing loss, bilateral: Secondary | ICD-10-CM | POA: Diagnosis not present

## 2020-01-12 DIAGNOSIS — H6123 Impacted cerumen, bilateral: Secondary | ICD-10-CM | POA: Diagnosis not present

## 2020-01-12 DIAGNOSIS — G9608 Other cranial cerebrospinal fluid leak: Secondary | ICD-10-CM | POA: Diagnosis not present

## 2020-02-21 ENCOUNTER — Ambulatory Visit: Payer: No Typology Code available for payment source | Admitting: Nurse Practitioner

## 2020-03-03 DIAGNOSIS — R002 Palpitations: Secondary | ICD-10-CM | POA: Diagnosis not present

## 2020-03-06 DIAGNOSIS — Z23 Encounter for immunization: Secondary | ICD-10-CM | POA: Diagnosis not present

## 2020-03-13 DIAGNOSIS — Z23 Encounter for immunization: Secondary | ICD-10-CM | POA: Diagnosis not present

## 2020-03-21 ENCOUNTER — Ambulatory Visit (INDEPENDENT_AMBULATORY_CARE_PROVIDER_SITE_OTHER): Payer: Medicare Other | Admitting: Nurse Practitioner

## 2020-03-21 ENCOUNTER — Other Ambulatory Visit: Payer: Self-pay

## 2020-03-21 ENCOUNTER — Encounter: Payer: Self-pay | Admitting: Nurse Practitioner

## 2020-03-21 VITALS — BP 134/48 | HR 80 | Temp 97.4°F | Resp 16 | Ht 60.0 in | Wt 114.0 lb

## 2020-03-21 DIAGNOSIS — Z0001 Encounter for general adult medical examination with abnormal findings: Secondary | ICD-10-CM | POA: Diagnosis not present

## 2020-03-21 DIAGNOSIS — I10 Essential (primary) hypertension: Secondary | ICD-10-CM

## 2020-03-21 DIAGNOSIS — Z1231 Encounter for screening mammogram for malignant neoplasm of breast: Secondary | ICD-10-CM | POA: Diagnosis not present

## 2020-03-21 DIAGNOSIS — R300 Dysuria: Secondary | ICD-10-CM | POA: Diagnosis not present

## 2020-03-21 DIAGNOSIS — F411 Generalized anxiety disorder: Secondary | ICD-10-CM | POA: Diagnosis not present

## 2020-03-21 DIAGNOSIS — R2990 Unspecified symptoms and signs involving the nervous system: Secondary | ICD-10-CM

## 2020-03-21 MED ORDER — ALPRAZOLAM 0.5 MG PO TABS: ORAL_TABLET | ORAL | 3 refills | Status: DC

## 2020-03-21 MED ORDER — ATENOLOL 25 MG PO TABS: ORAL_TABLET | ORAL | 3 refills | Status: DC

## 2020-03-21 NOTE — Progress Notes (Signed)
[facility] [place], [place] [address]  Internal MEDICINE  Office Visit Note  Patient Name: Monica Collins  [id number]  [id number]  Date of Service: 04/16/2020   Pt is here for routine health maintenance examination   Chief Complaint  Patient presents with  . Medicare Wellness  . Hypertension  . Quality Metric Gaps    mammogram,Hep C, flu  . controlled substance form    reviewed with PT     The patient is here for health maintenance exam. She states that she is doing well. She has seen cardiology. Has mildly irregular heart rhythm and takes atenolol for this. Has been on this for some time. Has also followed up with neurology since her most recent visit with me. Had routine EEG which was negative for seizure activity. Will have 72 hour EEG for further evaluaiton. She was referred to ENT due to sensorineural hearing loss and cardiology for irregular heart rhythm. She is due to have screening mammogram. She is also due to have routine, fasting labs. She is currently taking alprazolam 0.5mg  tablets up to three times daily. She will take a fourth dose on days when anxiety is overwhelming and worsens neurological symptoms. She is in process of weaning herself down thedosing of this medication.    Current Medication: Outpatient Encounter Medications as of 03/21/2020  Medication Sig  . ALPRAZolam (XANAX) 0.5 MG tablet Take 1 tablet po TID prn anxiety. May take 4th dose po QD prn severe anxiety.  . Ascorbic Acid (VITAMIN C PO) Take by mouth.  Marland Kitchen aspirin 81 MG chewable tablet Chew by mouth daily.  Marland Kitchen atenolol (TENORMIN) 25 MG tablet Take 1 to 2 tablets po QD for HTN  . [DISCONTINUED] ALPRAZolam (XANAX) 0.5 MG tablet Take 1 tablet po TID prn anxiety. May take 4th dose po QD prn severe anxiety.  . [DISCONTINUED] atenolol (TENORMIN) 25 MG tablet Take 1 to 2 tablets po QD for HTN  . [DISCONTINUED] clotrimazole-betamethasone (LOTRISONE) cream Apply 1 application  topically 2 (two) times daily. (Patient not taking: Reported on 03/21/2020)  . [DISCONTINUED] diclofenac sodium (VOLTAREN) 1 % GEL Apply 4 g topically 4 (four) times daily. (Patient not taking: Reported on 03/21/2020)  . [DISCONTINUED] diphenoxylate-atropine (LOMOTIL) 2.5-0.025 MG tablet Take 1 tablet by mouth 4 (four) times daily as needed for diarrhea or loose stools. (Patient not taking: Reported on 03/21/2020)  . [DISCONTINUED] fluconazole (DIFLUCAN) 150 MG tablet Take 1 tablet po once weekly (Patient not taking: Reported on 03/21/2020)  . [DISCONTINUED] gabapentin (NEURONTIN) 100 MG capsule Take 1 capsule (100 mg total) by mouth at bedtime. (Patient not taking: Reported on 03/21/2020)  . [DISCONTINUED] Ginger, Zingiber officinalis, (GINGER EXTRACT) 250 MG CAPS Take 1 tablet by mouth. (Patient not taking: Reported on 03/21/2020)  . [DISCONTINUED] Multiple Vitamin (MULTIVITAMIN) capsule Take 1 capsule by mouth daily. (Patient not taking: Reported on 03/21/2020)  . [DISCONTINUED] nicotine (NICODERM CQ - DOSED IN MG/24 HOURS) 14 mg/24hr patch Place 1 patch (14 mg total) onto the skin daily. (Patient not taking: Reported on 03/21/2020)  . [DISCONTINUED] ondansetron (ZOFRAN) 4 MG tablet Take 1 tablet (4 mg total) by mouth every 8 (eight) hours as needed for nausea or vomiting. (Patient not taking: Reported on 03/21/2020)  . [DISCONTINUED] triamcinolone (KENALOG) 0.025 % cream Apply 1 application topically 2 (two) times daily. (Patient not taking: Reported on 03/21/2020)   No facility-administered encounter medications on file as of 03/21/2020.    Surgical History: Past Surgical History:  Procedure  Laterality Date  . CHOLECYSTECTOMY      Medical History: Past Medical History:  Diagnosis Date  . Hypertension     Family History: Family History  Problem Relation Age of Onset  . Cancer Sister   . Cancer Brother       Review of Systems  Constitutional: Positive for fatigue. Negative for  activity change, chills and unexpected weight change.  HENT: Negative for congestion, postnasal drip, rhinorrhea, sneezing and sore throat.   Respiratory: Negative for cough, chest tightness, shortness of breath and wheezing.   Cardiovascular: Negative for chest pain and palpitations.       Blood pressure is generally well managed with current medication.   Gastrointestinal: Negative for abdominal pain, constipation, diarrhea, nausea and vomiting.  Endocrine: Negative for cold intolerance, heat intolerance, polydipsia and polyuria.  Genitourinary: Negative for dysuria, frequency and urgency.  Musculoskeletal: Negative for arthralgias, back pain, joint swelling, myalgias and neck pain.  Skin: Negative for rash.  Allergic/Immunologic: Negative for environmental allergies.  Neurological: Positive for seizures and weakness. Negative for dizziness, tremors, numbness and headaches.       Patient is now seeing neurologist  Hematological: Negative for adenopathy. Does not bruise/bleed easily.  Psychiatric/Behavioral: Positive for dysphoric mood and sleep disturbance. Negative for behavioral problems (Depression) and suicidal ideas. The patient is nervous/anxious.        Improved since her last visit     Today's Vitals   03/21/20 1532  BP: (!) 134/48  Pulse: 80  Resp: 16  Temp: (!) 97.4 F (36.3 C)  SpO2: 97%  Weight: 114 lb (51.7 kg)  Height: 5' (1.524 m)   Body mass index is 22.26 kg/m.  Physical Exam Vitals and nursing note reviewed.  Constitutional:      General: She is not in acute distress.    Appearance: Normal appearance. She is well-developed and well-nourished. She is not diaphoretic.  HENT:     Head: Normocephalic and atraumatic.     Nose: Nose normal.     Mouth/Throat:     Mouth: Oropharynx is clear and moist.     Pharynx: No oropharyngeal exudate.  Eyes:     Extraocular Movements: EOM normal.     Pupils: Pupils are equal, round, and reactive to light.  Neck:      Thyroid: No thyromegaly.     Vascular: No carotid bruit or JVD.     Trachea: No tracheal deviation.  Cardiovascular:     Rate and Rhythm: Normal rate and regular rhythm.     Pulses: Normal pulses.     Heart sounds: Normal heart sounds. No murmur heard. No friction rub. No gallop.   Pulmonary:     Effort: Pulmonary effort is normal. No respiratory distress.     Breath sounds: Normal breath sounds. No wheezing or rales.  Chest:     Chest wall: No tenderness.  Abdominal:     General: Bowel sounds are normal.     Palpations: Abdomen is soft.     Tenderness: There is no abdominal tenderness.  Musculoskeletal:        General: Normal range of motion.     Cervical back: Normal range of motion and neck supple.  Lymphadenopathy:     Cervical: No cervical adenopathy.  Skin:    General: Skin is warm and dry.     Capillary Refill: Capillary refill takes 2 to 3 seconds.  Neurological:     Mental Status: She is alert and oriented to person, place, and time.  Mental status is at baseline.     Cranial Nerves: No cranial nerve deficit.  Psychiatric:        Attention and Perception: Attention and perception normal.        Mood and Affect: Affect normal. Mood is anxious.        Speech: Speech normal.        Behavior: Behavior normal. Behavior is cooperative.        Thought Content: Thought content normal.        Cognition and Memory: Cognition and memory normal.        Judgment: Judgment normal.    Depression screen [facility] 2/9 03/21/2020 09/21/2019 05/24/2019 02/16/2019 10/22/2018  Decreased Interest 0 0 0 0 -  Down, Depressed, Hopeless 0 1 0 0 0  PHQ - 2 Score 0 1 0 0 0    Functional Status Survey: Is the patient deaf or have difficulty hearing?: Yes Does the patient have difficulty seeing, even when wearing glasses/contacts?: No Does the patient have difficulty concentrating, remembering, or making decisions?: Yes (remembering) Does the patient have difficulty walking or climbing stairs?:  No Does the patient have difficulty dressing or bathing?: No Does the patient have difficulty doing errands alone such as visiting a doctor's office or shopping?: No  MMSE - Mini Mental State Exam 03/21/2020 02/16/2019 02/12/2018  Orientation to time 5 5 4   Orientation to Place 5 5 5   Registration 3 3 3   Attention/ Calculation 5 5 5   Recall 3 3 3   Language- name 2 objects 2 2 2   Language- repeat 1 1 1   Language- follow 3 step command 3 3 3   Language- read & follow direction 1 1 1   Write a sentence 1 1 1   Copy design 1 1 1   Total score 30 30 29     Fall Risk  03/21/2020 09/21/2019 05/24/2019 02/16/2019 10/22/2018  Falls in the past year? 0 0 0 0 0  Comment - - - - -  Number falls in past yr: - - - - -  Comment - - - - -  Injury with Fall? - - - - -  Risk for fall due to : No Fall Risks - - - -  Follow up Falls evaluation completed - - - -     LABS: Recent Results (from the past 2160 hour(s))  UA/M w/rflx Culture, Routine     Status: Abnormal   Collection Time: 03/21/20  4:41 PM   Specimen: Urine   Urine  Result Value Ref Range   Specific Gravity, UA 1.019 1.005 - 1.030   pH, UA 6.0 5.0 - 7.5   Color, UA Yellow Yellow   Appearance Ur Cloudy (A) Clear   Leukocytes,UA Trace (A) Negative   Protein,UA Negative Negative/Trace   Glucose, UA Negative Negative   Ketones, UA Negative Negative   RBC, UA Negative Negative   Bilirubin, UA Negative Negative   Urobilinogen, Ur 0.2 0.2 - 1.0 mg/dL   Nitrite, UA Negative Negative   Microscopic Examination See below:     Comment: Microscopic was indicated and was performed.   Urinalysis Reflex Comment     Comment: This specimen has reflexed to a Urine Culture.  Microscopic Examination     Status: Abnormal   Collection Time: 03/21/20  4:41 PM   Urine  Result Value Ref Range   WBC, UA None seen 0 - 5 /hpf   RBC 3-10 (A) 0 - 2 /hpf   Epithelial Cells (non renal) >10 (A)  0 - 10 /hpf   Casts None seen None seen /lpf   Bacteria, UA  Few None seen/Few  Urine Culture, Reflex     Status: Abnormal   Collection Time: 03/21/20  4:41 PM   Urine  Result Value Ref Range   Urine Culture, Routine CANCELED (A)     Comment: Test not performed. Specimen not received at refrigerated temperature.  Result canceled by the ancillary.     Assessment/Plan: 1. Encounter for general adult medical examination with abnormal findings Annual health maintenance exam today. Order slip given to have routine, fasting labs drawn.   2. Essential hypertension Stable. Continue atenolol 25mg  daily. contineu regular visits with cardiology as scheduled.  - atenolol (TENORMIN) 25 MG tablet; Take 1 to 2 tablets po QD for HTN  Dispense: 180 tablet; Refill: 3  3. Neurological complaint conitneu regular visits with neurology as scheduled.   4. Anxiety, generalized Ok to continue with alprazolam 0.5mg  up to three times daily as needed for acute anxiety. May have fourth dose on days with severe anxiety. Discussed risks of long term use of BZO. Patient has agreed to try weaning down dosing and frequency of alprazolam dosing.  - ALPRAZolam (XANAX) 0.5 MG tablet; Take 1 tablet po TID prn anxiety. May take 4th dose po QD prn severe anxiety.  Dispense: 105 tablet; Refill: 3  5. Encounter for screening mammogram for malignant neoplasm of breast - MM DIGITAL SCREENING BILATERAL; Future  6. Dysuria - UA/M w/rflx Culture, Routine  General Counseling: Ellieanna verbalizes understanding of the findings of todays visit and agrees with plan of treatment. I have discussed any further diagnostic evaluation that may be needed or ordered today. We also reviewed her medications today. she has been encouraged to call the office with any questions or concerns that should arise related to todays visit.    Counseling:  Reviewed risks and possible side effects associated with taking opiates, benzodiazepines and other CNS depressants. Combination of these could cause dizziness  and drowsiness. Advised patient not to drive or operate machinery when taking these medications, as patient's and other's life can be at risk and will have consequences. Patient verbalized understanding in this matter. Dependence and abuse for these drugs will be monitored closely. A Controlled substance policy and procedure is on file which allows [facility] to order a urine drug screen test at any visit. Patient understands and agrees with the plan  This patient was seen by Leretha Pol FNP Collaboration with Dr Lavera Guise as a part of collaborative care agreement  Orders Placed This Encounter  Procedures  . Microscopic Examination  . Urine Culture, Reflex  . MM DIGITAL SCREENING BILATERAL  . UA/M w/rflx Culture, Routine    Meds ordered this encounter  Medications  . atenolol (TENORMIN) 25 MG tablet    Sig: Take 1 to 2 tablets po QD for HTN    Dispense:  180 tablet    Refill:  3    Order Specific Question:   Supervising Provider    Answer:   Lavera Guise [place]  . ALPRAZolam (XANAX) 0.5 MG tablet    Sig: Take 1 tablet po TID prn anxiety. May take 4th dose po QD prn severe anxiety.    Dispense:  105 tablet    Refill:  3    Please note that I added 4th dose on days with severe anxiety.    Order Specific Question:   Supervising Provider    Answer:   Humphrey Rolls,  FOZIA M [[id number]    Total time spent: 8 Minutes  Time spent includes review of chart, medications, test results, and follow up plan with the patient.     Lavera Guise, MD  Internal Medicine

## 2020-03-25 NOTE — Progress Notes (Signed)
Suspect contamination.

## 2020-03-27 LAB — MICROSCOPIC EXAMINATION
Casts: NONE SEEN /lpf
Epithelial Cells (non renal): >10 /hpf — AB (ref 0–10)
WBC, UA: NONE SEEN /hpf (ref 0–5)

## 2020-03-27 LAB — UA/M W/RFLX CULTURE, ROUTINE
Bilirubin, UA: NEGATIVE
Glucose, UA: NEGATIVE
Ketones, UA: NEGATIVE
Nitrite, UA: NEGATIVE
Protein,UA: NEGATIVE
RBC, UA: NEGATIVE
Specific Gravity, UA: 1.019 (ref 1.005–1.030)
Urobilinogen, Ur: 0.2 mg/dL (ref 0.2–1.0)
pH, UA: 6 (ref 5.0–7.5)

## 2020-03-27 LAB — URINE CULTURE, REFLEX

## 2020-03-29 ENCOUNTER — Telehealth: Payer: Self-pay

## 2020-03-29 NOTE — Telephone Encounter (Signed)
I sent her refills on 11/23. She contacted me as well. And then contacted the pharmacy and they had new proscription.

## 2020-04-16 DIAGNOSIS — R2990 Unspecified symptoms and signs involving the nervous system: Secondary | ICD-10-CM | POA: Insufficient documentation

## 2020-06-06 DIAGNOSIS — R002 Palpitations: Secondary | ICD-10-CM | POA: Diagnosis not present

## 2020-06-06 DIAGNOSIS — Z87898 Personal history of other specified conditions: Secondary | ICD-10-CM | POA: Diagnosis not present

## 2020-06-06 DIAGNOSIS — Z23 Encounter for immunization: Secondary | ICD-10-CM | POA: Diagnosis not present

## 2020-06-22 ENCOUNTER — Ambulatory Visit: Payer: No Typology Code available for payment source | Admitting: Hospice and Palliative Medicine

## 2020-07-10 ENCOUNTER — Other Ambulatory Visit: Payer: Self-pay | Admitting: Nurse Practitioner

## 2020-07-10 DIAGNOSIS — F411 Generalized anxiety disorder: Secondary | ICD-10-CM

## 2020-07-19 ENCOUNTER — Other Ambulatory Visit: Payer: Self-pay | Admitting: Nurse Practitioner

## 2020-07-19 DIAGNOSIS — F411 Generalized anxiety disorder: Secondary | ICD-10-CM

## 2020-07-19 MED ORDER — ALPRAZOLAM 0.5 MG PO TABS: ORAL_TABLET | ORAL | 0 refills | Status: DC

## 2020-07-19 NOTE — Progress Notes (Signed)
Renewed single 30 day prescription for alprazolam 0.5mg  up to tid prn anxiety. Sent to [facility] [address]. Patient notified. Upcoming appointment 08/01/2020

## 2020-08-01 ENCOUNTER — Ambulatory Visit (INDEPENDENT_AMBULATORY_CARE_PROVIDER_SITE_OTHER): Payer: Medicare Other | Admitting: Nurse Practitioner

## 2020-08-01 ENCOUNTER — Encounter: Payer: Self-pay | Admitting: Nurse Practitioner

## 2020-08-01 ENCOUNTER — Telehealth: Payer: Self-pay | Admitting: Nurse Practitioner

## 2020-08-01 ENCOUNTER — Other Ambulatory Visit: Payer: Self-pay

## 2020-08-01 VITALS — BP 128/74 | HR 63 | Temp 98.1°F | Ht 61.0 in | Wt 113.1 lb

## 2020-08-01 DIAGNOSIS — G40909 Epilepsy, unspecified, not intractable, without status epilepticus: Secondary | ICD-10-CM

## 2020-08-01 DIAGNOSIS — Z7689 Persons encountering health services in other specified circumstances: Secondary | ICD-10-CM | POA: Diagnosis not present

## 2020-08-01 DIAGNOSIS — Z85118 Personal history of other malignant neoplasm of bronchus and lung: Secondary | ICD-10-CM | POA: Diagnosis not present

## 2020-08-01 DIAGNOSIS — F411 Generalized anxiety disorder: Secondary | ICD-10-CM | POA: Diagnosis not present

## 2020-08-01 MED ORDER — LORAZEPAM 0.5 MG PO TABS: 0.5000 mg | ORAL_TABLET | Freq: Every evening | ORAL | 1 refills | Status: DC | PRN

## 2020-08-01 MED ORDER — ALPRAZOLAM 0.5 MG PO TABS: ORAL_TABLET | ORAL | 1 refills | Status: DC

## 2020-08-01 NOTE — Telephone Encounter (Signed)
Heather the pharmacist called and wanted to verify a 15 day supply of 30 pills of Lorazepam. Please advise, thanks.

## 2020-08-01 NOTE — Telephone Encounter (Signed)
Per Leretha Pol advised Nira Conn -pharmacist at [facility] to hold RX for Alprazalam until previous rx of Alprazalam up. Ok to give Lorazepam now for 30 tabs as original rx. AS< CMA

## 2020-08-01 NOTE — Progress Notes (Signed)
New Patient Office Visit  Subjective:  Patient ID: Monica Collins, female    DOB: February 23, 1946  Age: 75 y.o. MRN: [id number]  CC:  Chief Complaint  Patient presents with  . New Patient (Initial Visit)    HPI [name] presents to establish care with new primary care office. She has chosen to stay with her current PCP in new office to maintain continuity of care. The patient has been having seizures, she and her husband refer to them as "absent" seizures. During these episodes, she has recurrent movement of her hand, generally pointing over and over at someone or something. She is unable to communicate with anyone and she does not make eye contact. She doesn't respond to questions during these episodes. The episodes will last for about 2 minutes and then resolve on her own. After they resolve, the patient states that she is unaware that anything happened at all. She and her husband both report that this happens more often if she does not take her prescribed, PRN alprazolam. She currently takes 0.5mg  up to three times daily if needed for anxiety.  Of note, several years ago, when patient first came to see me as a provider from another provider, she was taking alprazolam 1mg  up to four times daily. She also took Vicodin 10/325mg  tablets three to four times daily. These had been prescribed for irritable bowel syndrome and belly pain which accompanied this. Over a relatively short period of time, we were able to work together to come off pain medication altogether. We also were able to cut back the dose of alprazolam to 0.5mg , three times daily as needed only. 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 She was in [place] for some time. This is when she started having the seizures. She had been discharged from the hospital on two different seizure prevention medications. She routinely saw a neurologist. She was eventually weaned down to keppra only.  She continued to take the alprazolam 0.5mg  three times daily prn. After many months on this medication, she started having some neurological side effects associated with taking the keppra.  The patient and her husband wanted to wean her off the keppra to see if it was even helping. As PCP, I did not feel comfortable with doing this and suggested they consult with neurology again to develop a safe weaning process to come off the medication, or if it was safe at all. She was able to safely wean off both keppra and previously depakote. A routine EEG was performed 12/2019 which was within normal limits. She was to have a 72 hour EEG. She did not have this test and has not followed up with the neurologist. At this point she she and her husband decline a new referral to neurology.  The patient is interested in weaning off of her alprazolam altogether. I reviewed her PDMP profile. Her overdose risk score is 070 and her fill history has been consistently appropriate. We discussed several alternative medications we could use to substitute for alprazolam and discussed a slow framework for weaning process. It is unclear to me whether seizure activity is occurring when she doesn't take her alprazolam, or if taking the alprazolam helps to stop the seizure activity when it is happening. We did discuss other medications in the same class with alprazolam are often used to treat acute seizures, such as ativan and valium.  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 She was seen for this and treated in the cancer center at [facility]. She should be going for yearly scans of her lungs and following up with oncologists there yearly. Based on current progress notes, she was due to have this done in March 2022. She will call to schedule appointment.  The patient has no current concerns or complaints. Blood pressure is doing well. She denies chest pain, chest pressure, unusual shortness of breath, or palpitations at  this time. She denies nausea, vomiting, or changes in her bowel habits.   Past Medical History:  Diagnosis Date  . Hypertension     Past Surgical History:  Procedure Laterality Date  . CHOLECYSTECTOMY      Family History  Problem Relation Age of Onset  . Cancer Sister   . Cancer Brother     Social History   Socioeconomic History  . Marital status: Married    Spouse name: Not on file  . Number of children: Not on file  . Years of education: Not on file  . Highest education level: Not on file  Occupational History  . Not on file  Tobacco Use  . Smoking status: Current Some Day Smoker    Packs/day: 12.00    Years: 50.00    Pack years: 600.00    Types: Cigarettes    Last attempt to quit: 10/21/2016    Years since quitting: 3.7  . Smokeless tobacco: Never Used  . Tobacco comment: 10 CIGARETTES A DAY   Vaping Use  . Vaping Use: Some days  Substance and Sexual Activity  . Alcohol use: No    Comment: once a year  . Drug use: Never  . Sexual activity: Not Currently    Birth control/protection: Post-menopausal  Other Topics Concern  . Not on file  Social History Narrative  . Not on file   Social Determinants of Health   Financial Resource Strain: Not on file  Food Insecurity: Not on file  Transportation Needs: Not on file  Physical Activity: Not on file  Stress: Not on file  Social Connections: Not on file  Intimate Partner Violence: Not on file    ROS Review of Systems  Constitutional: Negative for activity change, chills and fever.  HENT: Positive for rhinorrhea. Negative for congestion, sinus pressure, sinus pain, sore throat and tinnitus.   Eyes: Negative.   Respiratory: Negative for cough, chest tightness, shortness of breath and wheezing.   Cardiovascular: Negative for chest pain and palpitations.  Gastrointestinal: Negative for constipation, diarrhea, nausea and vomiting.  Endocrine: Negative.   Genitourinary: Negative.   Musculoskeletal: Negative  for arthralgias, back pain and myalgias.  Skin: Negative for rash.  Allergic/Immunologic: Negative for environmental allergies.  Neurological: Positive for seizures, weakness and light-headedness. Negative for dizziness and headaches.  Hematological: Negative for adenopathy.  Psychiatric/Behavioral: The patient is nervous/anxious.   All other systems reviewed and are negative.   Objective:   Today's Vitals   08/01/20 1424  BP: 128/74  Pulse: 63  Temp: 98.1 F (36.7 C)  SpO2: 97%  Weight: 113 lb 1.6 oz (51.3 kg)  Height: 5\' 1"  (1.549 m)   Body mass index is 21.37 kg/m.   Physical Exam Vitals and nursing note reviewed.  Constitutional:      Appearance: Normal appearance. She is well-developed.  HENT:     Head: Normocephalic and atraumatic.     Nose: Nose normal.  Eyes:     Extraocular Movements: Extraocular movements intact.     Conjunctiva/sclera:  Conjunctivae normal.     Pupils: Pupils are equal, round, and reactive to light.  Neck:     Vascular: No carotid bruit.  Cardiovascular:     Rate and Rhythm: Normal rate and regular rhythm.     Pulses: Normal pulses.     Heart sounds: Normal heart sounds.  Pulmonary:     Effort: Pulmonary effort is normal.     Breath sounds: Normal breath sounds.  Abdominal:     General: Bowel sounds are normal.     Palpations: Abdomen is soft.     Tenderness: There is no abdominal tenderness.  Musculoskeletal:        General: Normal range of motion.     Cervical back: Normal range of motion and neck supple.  Skin:    General: Skin is warm and dry.  Neurological:     Mental Status: She is alert and oriented to person, place, and time. Mental status is at baseline.  Psychiatric:        Attention and Perception: Attention and perception normal.        Mood and Affect: Affect normal. Mood is anxious.        Speech: Speech normal.        Behavior: Behavior normal. Behavior is cooperative.        Thought Content: Thought content normal.         Cognition and Memory: Cognition and memory normal.        Judgment: Judgment normal.     Assessment & Plan:  1. Encounter to establish care Appointment today to establish care in new primary care office, staying with current PCP to maintain continuity of care.   2. Seizure disorder (HCC) Add lorazepam 0.5mg  tablet at night only to help with seizure like activity. Recommended a neurological consultation due to reported increased episodes of seizure like activity. Patient and her husband both decline this referral at this time. They will monitor the symptoms and log them. If continues to increase, will accept the referral.  - LORazepam (ATIVAN) 0.5 MG tablet; Take 1 tablet (0.5 mg total) by mouth at bedtime and may repeat dose one time if needed.  Dispense: 30 tablet; Refill: 1  3. Anxiety, generalized Will decrease frequency of alprazolam 0.5mg  tablets to twice daily if needed for acute anxiety. Will have her titrate these doses to 1/2 tablet if possible. May add second 1/2 tablet if needed. Lorazepam 0.5mg  tablet added at night to take as needed for anxiety/seizure activity. We did discuss the risk factors and potential side effects of taking benzodiazepines for long periods of time. She and her husband both voiced understanding. Plan, moving forward, is to gradually wean the dosing and frequency of benzodiazepines.  - ALPRAZolam (XANAX) 0.5 MG tablet; Take 1 tablet po BID prn anxiety  Dispense: 60 tablet; Refill: 1 - LORazepam (ATIVAN) 0.5 MG tablet; Take 1 tablet (0.5 mg total) by mouth at bedtime and may repeat dose one time if needed.  Dispense: 30 tablet; Refill: 1  4. History of cancer of lower lobe bronchus or lung Patient will call cancer center at [facility] to schedule follow up appointment with oncologist.   Problem List Items Addressed This Visit      Nervous and Auditory   Seizure disorder ([place])   Relevant Medications   LORazepam (ATIVAN) 0.5 MG tablet     Other   Anxiety,  generalized   Relevant Medications   ALPRAZolam (XANAX) 0.5 MG tablet   LORazepam (ATIVAN) 0.5 MG  tablet   Encounter to establish care - Primary   History of cancer of lower lobe bronchus or lung      Outpatient Encounter Medications as of 08/01/2020  Medication Sig  . Ascorbic Acid (VITAMIN C PO) Take by mouth.  Marland Kitchen aspirin 81 MG chewable tablet Chew by mouth daily.  Marland Kitchen atenolol (TENORMIN) 25 MG tablet Take 1 to 2 tablets po QD for HTN  . LORazepam (ATIVAN) 0.5 MG tablet Take 1 tablet (0.5 mg total) by mouth at bedtime and may repeat dose one time if needed.  . [DISCONTINUED] ALPRAZolam (XANAX) 0.5 MG tablet Take 1 tablet po TID prn anxiety.  . ALPRAZolam (XANAX) 0.5 MG tablet Take 1 tablet po BID prn anxiety   No facility-administered encounter medications on file as of 08/01/2020.   Time spent with the patient was approximately 60 minutes. This time included reviewing progress notes, labs, imaging studies, and discussing plan for follow up.   Follow-up: Return in about 4 weeks (around 08/29/2020) for anxierty - needs FBW at time of visit or a week before.   Ronnell Freshwater, NP

## 2020-08-01 NOTE — Patient Instructions (Signed)
Seizure, Adult A seizure is a sudden burst of abnormal electrical and chemical activity in the brain. Seizures usually last from 30 seconds to 2 minutes.  What are the causes? Common causes of this condition include:  Fever or infection.  Problems that affect the brain. These may include: ? A brain or head injury. ? Bleeding in the brain. ? A brain tumor.  Low levels of blood sugar or salt.  Kidney problems or liver problems.  Conditions that are passed from parent to child (are inherited).  Problems with a substance, such as: ? Having a reaction to a drug or a medicine. ? Stopping the use of a substance all of a sudden (withdrawal).  A stroke.  Disorders that affect how you develop. Sometimes, the cause may not be known.  What increases the risk?  Having someone in your family who has epilepsy. In this condition, seizures happen again and again over time. They have no clear cause.  Having had a tonic-clonic seizure before. This type of seizure causes you to: ? Tighten the muscles of the whole body. ? Lose consciousness.  Having had a head injury or strokes before.  Having had a lack of oxygen at birth. What are the signs or symptoms? There are many types of seizures. The symptoms vary depending on the type of seizure you have. Symptoms during a seizure  Shaking that you cannot control (convulsions) with fast, jerky movements of muscles.  Stiffness of the body.  Breathing problems.  Feeling mixed up (confused).  Staring or not responding to sound or touch.  Head nodding.  Eyes that blink, flutter, or move fast.  Drooling, grunting, or making clicking sounds with your mouth  Losing control of when you pee or poop. Symptoms before a seizure  Feeling afraid, nervous, or worried.  Feeling like you may vomit.  Feeling like: ? You are moving when you are not. ? Things around you are moving when they are not.  Feeling like you saw or heard something before  (dj vu).  Odd tastes or smells.  Changes in how you see. You may see flashing lights or spots. Symptoms after a seizure  Feeling confused.  Feeling sleepy.  Headache.  Sore muscles. How is this treated? If your seizure stops on its own, you will not need treatment. If your seizure lasts longer than 5 minutes, you will normally need treatment. Treatment may include:  Medicines given through an IV tube.  Avoiding things, such as medicines, that are known to cause your seizures.  Medicines to prevent seizures.  A device to prevent or control seizures.  Surgery.  A diet low in carbohydrates and high in fat (ketogenic diet). Follow these instructions at home: Medicines  Take over-the-counter and prescription medicines only as told by your doctor.  Avoid foods or drinks that may keep your medicine from working, such as alcohol. Activity  Follow instructions about driving, swimming, or doing things that would be dangerous if you had another seizure. Wait until your doctor says it is safe for you to do these things.  If you live in the [place] ask your local department of motor vehicles when you can drive.  Get a lot of rest. Teaching others  Teach friends and family what to do when you have a seizure. They should: ? Help you get down to the ground. ? Protect your head and body. ? Loosen any clothing around your neck. ? Turn you on your side. ? Know whether or not   you need emergency care. ? Stay with you until you are better.  Also, tell them what not to do if you have a seizure. Tell them: ? They should not hold you down. ? They should not put anything in your mouth.   General instructions  Avoid anything that gives you seizures.  Keep a seizure diary. Write down: ? What you remember about each seizure. ? What you think caused each seizure.  Keep all follow-up visits. Contact a doctor if:  You have another seizure or seizures. Call the doctor each time you  have a seizure.  The pattern of your seizures changes.  You keep having seizures with treatment.  You have symptoms of being sick or having an infection.  You are not able to take your medicine. Get help right away if:  You have any of these problems: ? A seizure that lasts longer than 5 minutes. ? Many seizures in a row and you do not feel better between seizures. ? A seizure that makes it harder to breathe. ? A seizure and you can no longer speak or use part of your body.  You do not wake up right after a seizure.  You get hurt during a seizure.  You feel confused or have pain right after a seizure. These symptoms may be an emergency. Get help right away. Call your local emergency services (911 in the [place]  Do not wait to see if the symptoms will go away.  Do not drive yourself to the hospital. Summary  A seizure is a sudden burst of abnormal electrical and chemical activity in the brain. Seizures normally last from 30 seconds to 2 minutes.  Causes of seizures include illness, injury to the head, low levels of blood sugar or salt, and certain conditions.  Most seizures will stop on their own in less than 5 minutes. Seizures that last longer than 5 minutes are a medical emergency and need treatment right away.  Many medicines are used to treat seizures. Take over-the-counter and prescription medicines only as told by your doctor. This information is not intended to replace advice given to you by your health care provider. Make sure you discuss any questions you have with your health care provider. Document Revised: 10/22/2019 Document Reviewed: 10/22/2019 Elsevier Patient Education  2021 Elsevier Inc.  

## 2020-08-07 DIAGNOSIS — Z0000 Encounter for general adult medical examination without abnormal findings: Secondary | ICD-10-CM | POA: Insufficient documentation

## 2020-08-07 DIAGNOSIS — Z85118 Personal history of other malignant neoplasm of bronchus and lung: Secondary | ICD-10-CM | POA: Insufficient documentation

## 2020-08-07 DIAGNOSIS — Z7689 Persons encountering health services in other specified circumstances: Secondary | ICD-10-CM | POA: Insufficient documentation

## 2020-08-14 ENCOUNTER — Other Ambulatory Visit: Payer: Self-pay | Admitting: Nurse Practitioner

## 2020-08-14 DIAGNOSIS — Z9889 Other specified postprocedural states: Secondary | ICD-10-CM

## 2020-08-14 DIAGNOSIS — G40909 Epilepsy, unspecified, not intractable, without status epilepticus: Secondary | ICD-10-CM

## 2020-08-14 NOTE — Progress Notes (Signed)
Referral to neurology due to Increased frequency and length of time having seizure like activity. History of CVA with cerbral aneurysm.

## 2020-08-18 ENCOUNTER — Other Ambulatory Visit: Payer: Self-pay | Admitting: Nurse Practitioner

## 2020-08-18 DIAGNOSIS — F411 Generalized anxiety disorder: Secondary | ICD-10-CM

## 2020-08-18 MED ORDER — ALPRAZOLAM 0.5 MG PO TABS: ORAL_TABLET | ORAL | 1 refills | Status: DC

## 2020-08-18 NOTE — Progress Notes (Signed)
Patient notified me that she had dropped many of her alprazolam down her kitchen sink due to tremor and having a difficult time getting the lid off of the medicine bottle. She is now out of the medication and refills is not due for several weeks. Sent OK for pharmacy to fill new prescription now. Notified patient this is the only time I can do this for her. She voiced understanding.

## 2020-08-22 ENCOUNTER — Other Ambulatory Visit: Payer: Self-pay | Admitting: Nurse Practitioner

## 2020-08-22 DIAGNOSIS — F411 Generalized anxiety disorder: Secondary | ICD-10-CM

## 2020-08-22 DIAGNOSIS — G40909 Epilepsy, unspecified, not intractable, without status epilepticus: Secondary | ICD-10-CM

## 2020-08-31 ENCOUNTER — Ambulatory Visit: Payer: Medicare Other | Admitting: Nurse Practitioner

## 2020-09-15 ENCOUNTER — Other Ambulatory Visit: Payer: Self-pay | Admitting: Nurse Practitioner

## 2020-09-15 DIAGNOSIS — G40909 Epilepsy, unspecified, not intractable, without status epilepticus: Secondary | ICD-10-CM

## 2020-09-15 DIAGNOSIS — F411 Generalized anxiety disorder: Secondary | ICD-10-CM

## 2020-09-18 ENCOUNTER — Other Ambulatory Visit: Payer: Self-pay | Admitting: Nurse Practitioner

## 2020-09-18 DIAGNOSIS — G40909 Epilepsy, unspecified, not intractable, without status epilepticus: Secondary | ICD-10-CM

## 2020-09-18 DIAGNOSIS — F411 Generalized anxiety disorder: Secondary | ICD-10-CM

## 2020-09-18 MED ORDER — LORAZEPAM 0.5 MG PO TABS: 0.5000 mg | ORAL_TABLET | Freq: Every evening | ORAL | 1 refills | Status: DC | PRN

## 2020-09-18 NOTE — Progress Notes (Signed)
Renewed prescription for lorazepam. Take 1 tablet at bedtime as needed. A repeat dose may be taken once in 24 hours if needed. Sent #45 tablets with one refill to [facility].

## 2020-10-03 ENCOUNTER — Other Ambulatory Visit: Payer: Self-pay

## 2020-10-03 ENCOUNTER — Ambulatory Visit (INDEPENDENT_AMBULATORY_CARE_PROVIDER_SITE_OTHER): Payer: Medicare Other | Admitting: Nurse Practitioner

## 2020-10-03 ENCOUNTER — Encounter: Payer: Self-pay | Admitting: Nurse Practitioner

## 2020-10-03 VITALS — BP 133/65 | HR 65 | Temp 97.1°F | Ht 60.0 in | Wt 113.3 lb

## 2020-10-03 DIAGNOSIS — I10 Essential (primary) hypertension: Secondary | ICD-10-CM

## 2020-10-03 DIAGNOSIS — F411 Generalized anxiety disorder: Secondary | ICD-10-CM

## 2020-10-03 DIAGNOSIS — G40909 Epilepsy, unspecified, not intractable, without status epilepticus: Secondary | ICD-10-CM | POA: Diagnosis not present

## 2020-10-03 MED ORDER — ALPRAZOLAM 0.5 MG PO TABS: ORAL_TABLET | ORAL | 2 refills | Status: DC

## 2020-10-03 NOTE — Patient Instructions (Signed)
Managing Non-Epileptic Seizures, Adult A non-epileptic seizure is an event that can cause abnormal movements or a loss of consciousness. It is not caused by abnormal electrical and chemical activity in the brain (epileptic seizure). Non-epileptic seizures are caused by an underlying problem with body function (physiologic) or a mental health disorder (psychogenic). If you have been diagnosed with non-epileptic seizures, you can do things to manage your symptoms. You may have to try different things to see what works best for you. Your health care provider may also give you specific instructions. How does this condition affect me? If your seizures are physiologic, your health care provider will treat the cause. These seizures are not likely to return, and they do not need further treatment. If you have psychogenic non-epileptic seizures (PNES), it is very important to understand that your PNES is a real illness. Your seizures are not fake. They just have a different cause than other seizures. PNES can be treated. Work with your mental health provider to find a treatment that works for you. Talk with your health care provider about what activities are safe to do until your seizures are controlled. What actions can I take to manage the condition? Create a plan for how to deal with your seizures.  After a seizure, make notes about what was associated with the stress that led to the seizure. Then, create a plan to manage this stress.  Think of ways to change the stresses you cannot avoid. How to manage lifestyle changes Managing stress  Certain types of counseling can be very helpful for managing stress. A mental health provider can assess what other treatments may also help you, such as: ? Talk therapy (cognitive behavioral therapy, or CBT). Through CBT, you will learn to identify and manage the psychological distress that leads to seizures. ? Medicine to treat depression or anxiety. ? Biofeedback.  This uses signals from your body (physiologic responses) to help you learn to regulate anxiety.  Consider self-care strategies to lower stress levels, such as: ? Doing breathing exercises, yoga, or meditation. ? Listening to music. ? Doing recreational therapy or organized exercise. ? Giving yourself calming messages. ? Calling a friend to talk about your stress.   Relationships You may benefit from talking with a trusted friend or family member about your thoughts and feelings.   General instructions Learn as much as you can about your non-epileptic seizures.  Consider educating the people in your life about your condition. These should be trusted family members and others who spend time with you at work, school, or home.  Ask for the emotional support you would like.  Keep all follow-up visits. This is important. Follow these instructions at home: [place] may be prescribed to treat depression or anxiety that causes non-epileptic seizures. Avoid using alcohol and other substances that may prevent your medicines from working properly (may interact). It is also important to:  Talk with your pharmacist or health care provider about all the medicines that you take, their possible side effects, and what medicines are safe to take together.  Make it your goal to take part in all treatment decisions (shared decision-making). Ask about possible side effects of medicines that your health care provider recommends, and tell him or her how you feel about having those side effects. It is best if shared decision-making with your health care provider is part of your total treatment plan.  Take over-the-counter and prescription medicines only as told by your health care provider. General instructions  Ask your health care provider if it is safe for you to drive.  Return to your normal activities as told by your health care provider. Ask your health care provider what activities are safe  for you. These include working and playing sports.  Follow all instructions from your health care provider. These may include ways to prevent seizures and what to do if you have a seizure.  Eat a balanced diet.  Make sure you get full nights of sleep and regular daily exercise.  Keep all follow-up visits. This is important. Where to find support You can get support for managing non-epileptic seizures from support groups, either online or in-person. Your health care provider may be able to recommend a support group in your area. Where to find more information  Epilepsy Foundation: epilepsy.com  American Epilepsy Society: aesnet.org Contact a health care provider if:  Your seizures change or happen more often.  You continue to have seizures after treatment.  You show signs of depression or anxiety.  You have trouble doing your normal daily routine or work schedule. Get help right away if:  You injure yourself during a seizure.  You have one seizure after another.  You have trouble recovering from a seizure.  You have chest pain or trouble breathing.  A seizure lasts longer than 5 minutes.  You think about harming yourself or others. These symptoms may represent a serious problem that is an emergency. Do not wait to see if the symptoms will go away. Get medical help right away. Call your local emergency services (911 in the [place] Do not drive yourself to the hospital. If you ever feel like you may hurt yourself or others, or have thoughts about taking your own life, get help right away. Go to your nearest emergency department or:  Call your local emergency services (911 in the [place]  Call a suicide crisis helpline, such as the [facility] at (463) 140-4211. This is open 24 hours a day in the [place]  Text the Crisis Text Line at 602-607-4439 (in the [place].). Summary  The two types of non-epileptic seizures are physiologic non-epileptic seizures and  psychogenic non-epileptic seizures.  Work with your mental health provider to find a treatment that works for you.  Learning to recognize and avoid stress is an important part of preventing non-epileptic seizures. This information is not intended to replace advice given to you by your health care provider. Make sure you discuss any questions you have with your health care provider. Document Revised: 10/26/2019 Document Reviewed: 10/26/2019 Elsevier Patient Education  [place].

## 2020-10-03 NOTE — Progress Notes (Signed)
Established Patient Office Visit  Subjective:  Patient ID: Monica Collins, female    DOB: February 14, 1946  Age: 75 y.o. MRN: [id number]  CC:  Chief Complaint  Patient presents with   medication    HPI Monica Collins presents for follow up visit. Recently made changes made to medication regimen. She had been having absent seizures which were positively effected with treatment with benzodiazepines. At her most recent visit with me, I decreased her dosing of alprazolam 0.5mg  to twice daily as needed and added a dose of lorazepam 0.5mg  at night and as needed to treat symptoms of absent seizures and anxiety. She has been referred to neurology and has new patient appointment In July. The patient and her husband are both present at today's visit. They both state that since these changes have been made, the patient has had no episodes of absent seizures. She states that she has tolerated the change well and has not had any negative side effects from the change. She states that she is more functional at home. She is able to participate in more activities of daily living while at home. She does not drive. I reviewed her PDMP profile. Her overdose risk score is 050 and her fill history is appropriate.  The patient has no new concerns or complaints today. She denies chest pain, chest pressure, or shortness of breath. She denies headaches or visual disturbances. She denies abdominal pain, nausea, vomiting, or changes in bowel or bladder habits.    Past Medical History:  Diagnosis Date   Hypertension     Past Surgical History:  Procedure Laterality Date   CHOLECYSTECTOMY      Family History  Problem Relation Age of Onset   Cancer Sister    Cancer Brother     Social History   Socioeconomic History   Marital status: Married    Spouse name: Not on file   Number of children: Not on file   Years of education: Not on file   Highest education level: Not on file  Occupational History   Not on file   Tobacco Use   Smoking status: Some Days    Packs/day: 12.00    Years: 50.00    Pack years: 600.00    Types: Cigarettes    Last attempt to quit: 10/21/2016    Years since quitting: 3.9   Smokeless tobacco: Never   Tobacco comments:    10 CIGARETTES A DAY   Vaping Use   Vaping Use: Some days  Substance and Sexual Activity   Alcohol use: No    Comment: once a year   Drug use: Never   Sexual activity: Not Currently    Birth control/protection: Post-menopausal  Other Topics Concern   Not on file  Social History Narrative   Not on file   Social Determinants of Health   Financial Resource Strain: Not on file  Food Insecurity: Not on file  Transportation Needs: Not on file  Physical Activity: Not on file  Stress: Not on file  Social Connections: Not on file  Intimate Partner Violence: Not on file    Outpatient Medications Prior to Visit  Medication Sig Dispense Refill   Ascorbic Acid (VITAMIN C PO) Take by mouth.     aspirin 81 MG chewable tablet Chew by mouth daily.     atenolol (TENORMIN) 25 MG tablet Take 1 to 2 tablets po QD for HTN 180 tablet 3   LORazepam (ATIVAN) 0.5 MG tablet Take 1 tablet (0.5  mg total) by mouth at bedtime and may repeat dose one time if needed. 45 tablet 1   ALPRAZolam (XANAX) 0.5 MG tablet Take 1 tablet po BID prn anxiety 60 tablet 1   No facility-administered medications prior to visit.    Allergies  Allergen Reactions   Copper    Copper-Containing Compounds    Shellfish Allergy    Latex Rash    Rubber cleaning gloves used to clean home. But less reactive in recent years Rubber cleaning gloves used to clean home. But less reactive in recent years   Nickel Rash   Soap Rash    Detergent    ROS Review of Systems  Constitutional:  Positive for activity change. Negative for chills and fever.       Patient's ability to participate in activities of daily living has improved.   HENT:  Positive for rhinorrhea. Negative for congestion and  sinus pain.   Eyes: Negative.   Respiratory:  Negative for cough and wheezing.   Cardiovascular:  Negative for chest pain and palpitations.  Gastrointestinal:  Negative for constipation, diarrhea, nausea and vomiting.  Endocrine: Negative for cold intolerance, heat intolerance, polydipsia and polyuria.  Musculoskeletal:  Negative for back pain and myalgias.  Skin:  Negative for rash.  Allergic/Immunologic: Negative.   Neurological:  Positive for seizures. Negative for dizziness, weakness and headaches.       Patient reports no absent seizures since changing medication regimrn  Hematological: Negative.   Psychiatric/Behavioral:  The patient is nervous/anxious.      Objective:    Physical Exam Vitals and nursing note reviewed.  Constitutional:      Appearance: Normal appearance. She is well-developed.  HENT:     Head: Normocephalic and atraumatic.     Nose: Congestion present.     Mouth/Throat:     Mouth: Mucous membranes are moist.  Eyes:     Extraocular Movements: Extraocular movements intact.     Conjunctiva/sclera: Conjunctivae normal.     Pupils: Pupils are equal, round, and reactive to light.  Neck:     Vascular: No carotid bruit.  Cardiovascular:     Rate and Rhythm: Normal rate and regular rhythm.     Pulses: Normal pulses.     Heart sounds: Normal heart sounds.  Pulmonary:     Effort: Pulmonary effort is normal.     Breath sounds: Normal breath sounds.  Abdominal:     Palpations: Abdomen is soft.  Musculoskeletal:        General: Normal range of motion.     Cervical back: Normal range of motion and neck supple.  Lymphadenopathy:     Cervical: No cervical adenopathy.  Skin:    General: Skin is warm and dry.     Capillary Refill: Capillary refill takes less than 2 seconds.  Neurological:     General: No focal deficit present.     Mental Status: She is alert and oriented to person, place, and time. Mental status is at baseline.  Psychiatric:        Mood and  Affect: Mood normal.        Behavior: Behavior normal.        Thought Content: Thought content normal.        Judgment: Judgment normal.    Today's Vitals   10/03/20 1437  BP: 133/65  Pulse: 65  Temp: (!) 97.1 F (36.2 C)  SpO2: 95%  Weight: 113 lb 4.8 oz (51.4 kg)  Height: 5' (1.524 m)  Body mass index is 22.13 kg/m.   Wt Readings from Last 3 Encounters:  10/03/20 113 lb 4.8 oz (51.4 kg)  08/01/20 113 lb 1.6 oz (51.3 kg)  03/21/20 114 lb (51.7 kg)     Health Maintenance Due  Topic Date Due   TETANUS/TDAP  Never done   Zoster Vaccines- Shingrix (1 of 2) Never done   MAMMOGRAM  10/04/2017   COVID-19 Vaccine (4 - Booster for Pfizer series) 06/10/2020    There are no preventive care reminders to display for this patient.  No results found for: TSH Lab Results  Component Value Date   WBC 7.4 05/13/2017   HGB 14.5 05/13/2017   HCT 42.0 05/13/2017   MCV 92.7 05/13/2017   PLT 194 05/13/2017   Lab Results  Component Value Date   NA 139 05/13/2017   K 4.3 05/13/2017   CO2 28 05/13/2017   GLUCOSE 97 05/13/2017   BUN 11 05/13/2017   CREATININE 0.77 05/13/2017   BILITOT 0.8 05/13/2017   ALKPHOS 85 05/13/2017   AST 33 05/13/2017   ALT 26 05/13/2017   PROT 7.1 05/13/2017   ALBUMIN 4.3 05/13/2017   CALCIUM 9.3 05/13/2017   ANIONGAP 9 05/13/2017   No results found for: CHOL No results found for: HDL No results found for: LDLCALC No results found for: TRIG No results found for: CHOLHDL No results found for: HGBA1C    Assessment & Plan:  1. Essential hypertension [place]. [place]ine bp medication as prescribed   2. Seizure disorder ([place]) Improved. Will continue with current prescription for lorazepam 0.5mg  at bedtime and once daily if needed for seizure activity and anxieyt. Refill as needed.   3. Anxiety, generalized May continue to take alprazolam 0.5mg  up to twice daily as needed for acute anxiety. New prescription sent to her pharmacy today.  -  ALPRAZolam (XANAX) 0.5 MG tablet; Take 1 tablet po BID prn anxiety  Dispense: 60 tablet; Refill: 2   Problem List Items Addressed This Visit       Cardiovascular and Mediastinum   Essential hypertension - Primary     Nervous and Auditory   Seizure disorder (HCC)     Other   Anxiety, generalized   Relevant Medications   ALPRAZolam (XANAX) 0.5 MG tablet    Meds ordered this encounter  Medications   ALPRAZolam (XANAX) 0.5 MG tablet    Sig: Take 1 tablet po BID prn anxiety    Dispense:  60 tablet    Refill:  2    Order Specific Question:   Supervising Provider    Answer:   Beatrice Lecher D [[id number]]    Follow-up: Return in about 3 months (around 01/03/2021) for [place] - labs needed at time of visit .    Ronnell Freshwater, NP

## 2020-11-07 ENCOUNTER — Other Ambulatory Visit: Payer: Self-pay | Admitting: Nurse Practitioner

## 2020-11-07 ENCOUNTER — Telehealth: Payer: Self-pay | Admitting: Nurse Practitioner

## 2020-11-07 DIAGNOSIS — F411 Generalized anxiety disorder: Secondary | ICD-10-CM

## 2020-11-07 DIAGNOSIS — G40909 Epilepsy, unspecified, not intractable, without status epilepticus: Secondary | ICD-10-CM

## 2020-11-07 MED ORDER — LORAZEPAM 0.5 MG PO TABS: 1.0000 mg | ORAL_TABLET | Freq: Every evening | ORAL | 2 refills | Status: DC | PRN

## 2020-11-07 NOTE — Telephone Encounter (Signed)
The pharmacist called asking for directions on patient's Ativan- they would like clear directions on whether it is just a seven day supply which is 45 tablets and the dosage amount. Please advise, thanks.

## 2020-11-07 NOTE — Progress Notes (Signed)
Resent prescription for lorazepam 1mg  tablets to [facility] on [address], as patient states pharmacy did not receive prescription.

## 2020-11-07 NOTE — Telephone Encounter (Signed)
Spoke to pharmacy. Reiterated rx. AS, CMA

## 2020-11-07 NOTE — Progress Notes (Signed)
Patient states she is needing to take to 0.5 mg lorazepam tablets at bedtime to help her sleep.  She is told if she does not sleep, and she does have more frequent absent seizures during the day.  I have increased the dose of her lorazepam to 1 mg tablets at bedtime.  She may take a second tablet during the day.  Advised her to take 1/2 tablet if needed.  May take a second 1/2 tablet later in the day if needed again.  Prescription for #45 tablets with 2 refills was sent to her pharmacy.  She does have an appointment upcoming with neurology for further evaluation.

## 2020-11-14 ENCOUNTER — Ambulatory Visit (INDEPENDENT_AMBULATORY_CARE_PROVIDER_SITE_OTHER): Payer: Medicare Other | Admitting: Diagnostic Neuroimaging

## 2020-11-14 ENCOUNTER — Encounter: Payer: Self-pay | Admitting: Diagnostic Neuroimaging

## 2020-11-14 VITALS — BP 137/70 | HR 59 | Ht 60.0 in | Wt 112.0 lb

## 2020-11-14 DIAGNOSIS — G40909 Epilepsy, unspecified, not intractable, without status epilepticus: Secondary | ICD-10-CM

## 2020-11-14 DIAGNOSIS — I6930 Unspecified sequelae of cerebral infarction: Secondary | ICD-10-CM | POA: Diagnosis not present

## 2020-11-14 MED ORDER — LAMOTRIGINE 25 MG PO TABS: ORAL_TABLET | ORAL | 3 refills | Status: DC

## 2020-11-14 NOTE — Progress Notes (Signed)
[facility]  PATIENT: Monica Collins DOB: [birth year]  REFERRING CLINICIAN: Ronnell Freshwater, NP HISTORY FROM: patient  REASON FOR VISIT: new consult    HISTORICAL  CHIEF COMPLAINT:  Chief Complaint  Patient presents with   Seizures    Rm 6 with husband lynwood and daughter tiffany  Pt is well, her PCP have some concerns for medications she is on.     HISTORY OF PRESENT ILLNESS:   75 year old female here for evaluation of seizures.  2018 patient was being treated for stage II lung cancer and had screening MRI of the brain.  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  She underwent craniotomy and aneurysm clipping on 10/23/2016.  Patient had a postoperative stroke affecting right MCA region.  Patient then had clinical and electrographic seizures in the hospital.  Patient had confusion, staring spell, abnormal movements of her left arm and hand, grimacing and abnormal facial movements.  She was treated with Keppra.  Patient had side effects with Keppra and this was changed to carbamazepine which also exacerbate his symptoms.  She stopped taking antiseizure medications.  Of note patient has been on Xanax since 1992.  This is been used for anxiety and IBS.  Since she had seizure disorder she was managed with these benzodiazepines to help with seizure control.  Now patient on combination of Xanax and Ativan.  Sometimes she misses a dose of Xanax and usually has a breakthrough seizure.  This has been happening every 3 to 4 weeks.   REVIEW OF SYSTEMS: Full 14 system review of systems performed and negative with exception of: as per HPI.  ALLERGIES: Allergies  Allergen Reactions   Copper    Copper-Containing Compounds    Shellfish Allergy    Latex Rash    Rubber cleaning gloves used to clean home. But less reactive in recent years Rubber cleaning gloves used to clean home. But less reactive in recent years   Nickel Rash   Soap Rash    Detergent     HOME MEDICATIONS: Outpatient Medications Prior to Visit  Medication Sig Dispense Refill   ALPRAZolam (XANAX) 0.5 MG tablet Take 1 tablet po BID prn anxiety 60 tablet 2   Ascorbic Acid (VITAMIN C PO) Take by mouth.     aspirin 81 MG chewable tablet Chew by mouth daily.     atenolol (TENORMIN) 25 MG tablet Take 1 to 2 tablets po QD for HTN 180 tablet 3   LORazepam (ATIVAN) 0.5 MG tablet Take 2 tablets (1 mg total) by mouth at bedtime and may repeat dose one time if needed. 45 tablet 2   No facility-administered medications prior to visit.    PAST MEDICAL HISTORY: Past Medical History:  Diagnosis Date   Hypertension     PAST SURGICAL HISTORY: Past Surgical History:  Procedure Laterality Date   CHOLECYSTECTOMY      FAMILY HISTORY: Family History  Problem Relation Age of Onset   Cancer Sister    Cancer Brother     SOCIAL HISTORY: Social History   Socioeconomic History   Marital status: Married    Spouse name: Not on file   Number of children: Not on file   Years of education: Not on file   Highest education level: Not on file  Occupational History   Not on file  Tobacco Use   Smoking status: Some Days    Packs/day: 12.00    Years: 50.00    Pack years: 600.00  Types: Cigarettes    Last attempt to quit: 10/21/2016    Years since quitting: 4.0   Smokeless tobacco: Never   Tobacco comments:    10 CIGARETTES A DAY   Vaping Use   Vaping Use: Some days  Substance and Sexual Activity   Alcohol use: No    Comment: once a year   Drug use: Never   Sexual activity: Not Currently    Birth control/protection: Post-menopausal  Other Topics Concern   Not on file  Social History Narrative   Not on file   Social Determinants of Health   Financial Resource Strain: Not on file  Food Insecurity: Not on file  Transportation Needs: Not on file  Physical Activity: Not on file  Stress: Not on file  Social Connections: Not on file  Intimate Partner Violence: Not on  file     PHYSICAL EXAM  GENERAL EXAM/CONSTITUTIONAL: Vitals:  Vitals:   11/14/20 1241  BP: 137/70  Pulse: (!) 59  Weight: 112 lb (50.8 kg)  Height: 5' (1.524 m)   Body mass index is 21.87 kg/m. Wt Readings from Last 3 Encounters:  11/14/20 112 lb (50.8 kg)  10/03/20 113 lb 4.8 oz (51.4 kg)  08/01/20 113 lb 1.6 oz (51.3 kg)   Patient is in no distress; well developed, nourished and groomed; neck is supple  CARDIOVASCULAR: Examination of carotid arteries is normal; no carotid bruits Regular rate and rhythm, no murmurs Examination of peripheral vascular system by observation and palpation is normal  EYES: Ophthalmoscopic exam of optic discs and posterior segments is normal; no papilledema or hemorrhages No results found.  MUSCULOSKELETAL: Gait, strength, tone, movements noted in Neurologic exam below  NEUROLOGIC: MENTAL STATUS:  MMSE - Mini Mental State Exam 03/21/2020 02/16/2019 02/12/2018  Orientation to time 5 5 4   Orientation to Place 5 5 5   Registration 3 3 3   Attention/ Calculation 5 5 5   Recall 3 3 3   Language- name 2 objects 2 2 2   Language- repeat 1 1 1   Language- follow 3 step command 3 3 3   Language- read & follow direction 1 1 1   Write a sentence 1 1 1   Copy design 1 1 1   Total score 30 30 29    awake, alert, oriented to person, place and time recent and remote memory intact normal attention and concentration language fluent, comprehension intact, naming intact fund of knowledge appropriate  CRANIAL NERVE:  2nd - no papilledema on fundoscopic exam 2nd, 3rd, 4th, 6th - pupils equal and reactive to light, visual fields full to confrontation, extraocular muscles intact, no nystagmus 5th - facial sensation symmetric 7th - facial strength symmetric 8th - hearing intact 9th - palate elevates symmetrically, uvula midline 11th - shoulder shrug symmetric 12th - tongue protrusion midline  MOTOR:  normal bulk and tone, full strength in the BUE,  BLE  SENSORY:  normal and symmetric to light touch, temperature, vibration  COORDINATION:  finger-nose-finger, fine finger movements normal  REFLEXES:  deep tendon reflexes present and symmetric  GAIT/STATION:  narrow based gait     DIAGNOSTIC DATA (LABS, IMAGING, TESTING) - I reviewed patient records, labs, notes, testing and imaging myself where available.  Lab Results  Component Value Date   WBC 7.4 05/13/2017   HGB 14.5 05/13/2017   HCT 42.0 05/13/2017   MCV 92.7 05/13/2017   PLT 194 05/13/2017      Component Value Date/Time   NA 139 05/13/2017 1951   K 4.3 05/13/2017 1951  CL 102 05/13/2017 1951   CO2 28 05/13/2017 1951   GLUCOSE 97 05/13/2017 1951   BUN 11 05/13/2017 1951   CREATININE 0.77 05/13/2017 1951   CALCIUM 9.3 05/13/2017 1951   PROT 7.1 05/13/2017 1951   ALBUMIN 4.3 05/13/2017 1951   AST 33 05/13/2017 1951   ALT 26 05/13/2017 1951   ALKPHOS 85 05/13/2017 1951   BILITOT 0.8 05/13/2017 1951   GFRNONAA >60 05/13/2017 1951   GFRAA >60 05/13/2017 1951   No results found for: CHOL, HDL, LDLCALC, LDLDIRECT, TRIG, CHOLHDL No results found for: HGBA1C No results found for: VITAMINB12 No results found for: TSH   11/09/17 MRI brain / MRA head  - Sequela of prior right MCA aneurysm clip ligation. No residual or new aneurysm  identified. - Right frontotemporal and insular encephalomalacia and gliosis. Asymmetric atrophy of  the right hippocampal complex and amygdala.    ASSESSMENT AND PLAN  75 y.o. year old female here with:  Dx:  1. Seizure disorder ([place])   2. Chronic ischemic right MCA stroke      PLAN:  RIGHT MCA STROKE --> COMPLEX PARTIAL SEIZURES (R frontal and R temporal lobe localization) - start lamotrigine for seizure control - continue benzodiazepines for now (for anxiety / IBS); recommend to wean off under supervision of psychiatry  Meds ordered this encounter  Medications   lamoTRIgine (LAMICTAL) 25 MG tablet    Sig: Take  25mg  daily for 2 weeks; then take 25mg  twice a day for 2 weeks; then take 50mg  twice a day for 2 weeks; then take 75mg  twice a day for 2 weeks; then 100mg  twice a day    Dispense:  240 tablet    Refill:  3   Return in about 6 months (around 05/17/2021).  I reviewed images, labs, notes, records myself. I summarized findings and reviewed with patient, for this high risk condition (seizures) requiring high complexity decision making.    Penni Bombard, MD 06/10/2480, 5:00 PM Certified in Neurology, Neurophysiology and Neuroimaging  [facility] [address], [place], [place] [address] [phone]

## 2020-11-14 NOTE — Patient Instructions (Addendum)
  RIGHT MCA STROKE --> COMPLEX PARTIAL SEIZURES (frontal and temporal lobe) - start lamotrigine for anti-seizure control - continue benzodiazepines for now (for anxiety / IBS); recommend to wean off under supervision of psychiatry

## 2020-11-28 ENCOUNTER — Other Ambulatory Visit: Payer: Self-pay | Admitting: Nurse Practitioner

## 2020-11-28 DIAGNOSIS — F411 Generalized anxiety disorder: Secondary | ICD-10-CM

## 2020-11-28 DIAGNOSIS — G40909 Epilepsy, unspecified, not intractable, without status epilepticus: Secondary | ICD-10-CM

## 2020-11-28 MED ORDER — LORAZEPAM 1 MG PO TABS: ORAL_TABLET | ORAL | 2 refills | Status: DC

## 2020-11-28 NOTE — Progress Notes (Signed)
Renewed prescription for 1mg  tablets. Patient may take 1 tablet at bedtime. She may take 1/2 tablet up to twice daily. Referral to psychiatry to be made at next visit for weaning.

## 2020-12-19 ENCOUNTER — Other Ambulatory Visit: Payer: Self-pay | Admitting: Nurse Practitioner

## 2020-12-19 DIAGNOSIS — F411 Generalized anxiety disorder: Secondary | ICD-10-CM

## 2020-12-19 DIAGNOSIS — G40909 Epilepsy, unspecified, not intractable, without status epilepticus: Secondary | ICD-10-CM

## 2020-12-19 MED ORDER — LORAZEPAM 1 MG PO TABS: ORAL_TABLET | ORAL | 2 refills | Status: DC

## 2020-12-19 NOTE — Progress Notes (Signed)
Sent new rx for lorazepam to [facility] in [place] per patient request

## 2021-01-03 ENCOUNTER — Ambulatory Visit (INDEPENDENT_AMBULATORY_CARE_PROVIDER_SITE_OTHER): Payer: Medicare Other | Admitting: Nurse Practitioner

## 2021-01-03 ENCOUNTER — Other Ambulatory Visit: Payer: Self-pay

## 2021-01-03 ENCOUNTER — Encounter: Payer: Self-pay | Admitting: Nurse Practitioner

## 2021-01-03 VITALS — BP 104/62 | HR 85 | Ht 60.0 in | Wt 110.9 lb

## 2021-01-03 DIAGNOSIS — Z122 Encounter for screening for malignant neoplasm of respiratory organs: Secondary | ICD-10-CM

## 2021-01-03 DIAGNOSIS — I10 Essential (primary) hypertension: Secondary | ICD-10-CM

## 2021-01-03 DIAGNOSIS — F411 Generalized anxiety disorder: Secondary | ICD-10-CM | POA: Diagnosis not present

## 2021-01-03 DIAGNOSIS — Z1231 Encounter for screening mammogram for malignant neoplasm of breast: Secondary | ICD-10-CM

## 2021-01-03 DIAGNOSIS — Z0000 Encounter for general adult medical examination without abnormal findings: Secondary | ICD-10-CM

## 2021-01-03 DIAGNOSIS — Z1211 Encounter for screening for malignant neoplasm of colon: Secondary | ICD-10-CM

## 2021-01-03 DIAGNOSIS — F17200 Nicotine dependence, unspecified, uncomplicated: Secondary | ICD-10-CM

## 2021-01-03 DIAGNOSIS — G40909 Epilepsy, unspecified, not intractable, without status epilepticus: Secondary | ICD-10-CM

## 2021-01-03 DIAGNOSIS — Z85118 Personal history of other malignant neoplasm of bronchus and lung: Secondary | ICD-10-CM | POA: Diagnosis not present

## 2021-01-03 DIAGNOSIS — Z87891 Personal history of nicotine dependence: Secondary | ICD-10-CM

## 2021-01-03 MED ORDER — LORAZEPAM 1 MG PO TABS: ORAL_TABLET | ORAL | 2 refills | Status: DC

## 2021-01-03 MED ORDER — ATENOLOL 25 MG PO TABS: ORAL_TABLET | ORAL | 3 refills | Status: DC

## 2021-01-03 MED ORDER — ALPRAZOLAM 0.5 MG PO TABS: ORAL_TABLET | ORAL | 2 refills | Status: DC

## 2021-01-03 NOTE — Progress Notes (Signed)
Subjective:   Monica Collins is a 75 y.o. female who presents for Medicare Annual (Subsequent) preventive examination.  She states that she is doing well.  Blood pressure is well managed.  She states that she takes her blood pressure medication and 1 mg lorazepam every morning.  She does need to take 1/2 tablet of lorazepam in the afternoon sometimes.  She is taking alprazolam only at night.  Reviewed her PDMP profile today.  Her overdose was score is 140.  She does have 2 refills of lorazepam remaining.  Does need refill for alprazolam.  She has seen as a neurologist since her last visit with me due to ongoing "silent" seizures.  Was started on Lamictal.  She is currently taking 2 tablets and gradually ramping to 3 tablets.  She did try taking 3 tablets once which caused her some negative side effects.  We will stay on the 2 tablets for a few more weeks and then try going back up to 3 tablets daily.  States since her Lamictal, she has not had any episodes of seizure-like activity.  She is due to follow-up with her neurologist in November. She is due to have routine, fasting labs. She does have history of lung cancer.  Had been having yearly screenings after treatment.  Has not had a lung cancer screening for at least 2 years.  She continues to smoke 1/2 pack cigarettes per day. She is due for screening mammogram. She has no new concerns or complaints today.  She denies chest pain, chest pressure, or shortness of breath. She denies headaches or visual disturbances. She denies abdominal pain, nausea, vomiting, or changes in bowel or bladder habits.    Review of Systems    Review of Systems  Constitutional:  Negative for activity change, appetite change, chills, fatigue and fever.  HENT:  Negative for congestion, postnasal drip, rhinorrhea, sinus pressure, sinus pain, sneezing and sore throat.   Eyes: Negative.   Respiratory:  Negative for cough, chest tightness, shortness of breath and wheezing.    Cardiovascular:  Negative for chest pain and palpitations.  Gastrointestinal:  Negative for abdominal pain, constipation, diarrhea, nausea and vomiting.  Endocrine: Negative for cold intolerance, heat intolerance, polydipsia and polyuria.  Genitourinary:  Negative for dyspareunia, dysuria, flank pain, frequency and urgency.  Musculoskeletal:  Negative for arthralgias, back pain and myalgias.  Skin:  Negative for rash.  Allergic/Immunologic: Positive for environmental allergies.  Neurological:  Positive for dizziness, tremors and seizures. Negative for weakness and headaches.  Hematological:  Negative for adenopathy.  Psychiatric/Behavioral:  Positive for dysphoric mood. The patient is nervous/anxious.          Objective:   Physical Exam Vitals and nursing note reviewed.  Constitutional:      Appearance: Normal appearance.  HENT:     Head: Normocephalic and atraumatic.     Right Ear: Tympanic membrane, ear canal and external ear normal.     Left Ear: Tympanic membrane, ear canal and external ear normal.     Nose: Nose normal.     Mouth/Throat:     Mouth: Mucous membranes are moist.     Pharynx: Oropharynx is clear.  Eyes:     Extraocular Movements: Extraocular movements intact.     Conjunctiva/sclera: Conjunctivae normal.     Pupils: Pupils are equal, round, and reactive to light.  Neck:     Thyroid: No thyromegaly.     Vascular: No carotid bruit.  Cardiovascular:     Rate and  Rhythm: Normal rate and regular rhythm.     Pulses: Normal pulses.     Heart sounds: Normal heart sounds.  Pulmonary:     Effort: Pulmonary effort is normal.     Breath sounds: Normal breath sounds. No wheezing.  Abdominal:     General: Abdomen is flat. Bowel sounds are normal.     Palpations: Abdomen is soft.     Tenderness: There is no abdominal tenderness.  Musculoskeletal:        General: Normal range of motion.     Cervical back: Normal range of motion and neck supple.  Skin:    General:  Skin is warm and dry.     Capillary Refill: Capillary refill takes less than 2 seconds.  Neurological:     General: No focal deficit present.     Mental Status: She is alert and oriented to person, place, and time. Mental status is at baseline.  Psychiatric:        Mood and Affect: Mood and affect normal.        Behavior: Behavior normal.        Thought Content: Thought content normal.        Judgment: Judgment normal.    Today's Vitals   01/03/21 1436  BP: 104/62  Pulse: 85  SpO2: 95%  Weight: 110 lb 14.4 oz (50.3 kg)  Height: 5' (1.524 m)   Body mass index is 21.66 kg/m.  Advanced Directives 12/02/2016 11/25/2016 11/21/2016 11/20/2016  Does Patient Have a Medical Advance Directive? No No No No  Would patient like information on creating a medical advance directive? - - - Yes (MAU/Ambulatory/Procedural Areas - Information given)    Current Medications (verified) Outpatient Encounter Medications as of 01/03/2021  Medication Sig   Ascorbic Acid (VITAMIN C PO) Take by mouth.   aspirin 81 MG chewable tablet Chew by mouth daily.   lamoTRIgine (LAMICTAL) 25 MG tablet Take 25mg  daily for 2 weeks; then take 25mg  twice a day for 2 weeks; then take 50mg  twice a day for 2 weeks; then take 75mg  twice a day for 2 weeks; then 100mg  twice a day   [DISCONTINUED] ALPRAZolam (XANAX) 0.5 MG tablet Take 1 tablet po BID prn anxiety   [DISCONTINUED] atenolol (TENORMIN) 25 MG tablet Take 1 to 2 tablets po QD for HTN   [DISCONTINUED] LORazepam (ATIVAN) 1 MG tablet Take 1 tablet po QHS prn. May take 1/2 tablet po BID prn anxiety   ALPRAZolam (XANAX) 0.5 MG tablet Take 1 tablet po QHS prn   atenolol (TENORMIN) 25 MG tablet Take 1 to 2 tablets po QD for HTN   LORazepam (ATIVAN) 1 MG tablet Take 1 tablet po QAM. May take additional 1/2 tablet twice daily prn seizures/anxiety   No facility-administered encounter medications on file as of 01/03/2021.    Allergies (verified) Copper, Copper-containing compounds,  Shellfish allergy, Latex, Nickel, and Soap   History: Past Medical History:  Diagnosis Date   Hypertension    Past Surgical History:  Procedure Laterality Date   CHOLECYSTECTOMY     Family History  Problem Relation Age of Onset   Cancer Sister    Cancer Brother    Social History   Socioeconomic History   Marital status: Married    Spouse name: Not on file   Number of children: Not on file   Years of education: Not on file   Highest education level: Not on file  Occupational History   Not on file  Tobacco Use  Smoking status: Some Days    Packs/day: 12.00    Years: 50.00    Pack years: 600.00    Types: Cigarettes    Last attempt to quit: 10/21/2016    Years since quitting: 4.2   Smokeless tobacco: Never   Tobacco comments:    10 CIGARETTES A DAY   Vaping Use   Vaping Use: Some days  Substance and Sexual Activity   Alcohol use: No    Comment: once a year   Drug use: Never   Sexual activity: Not Currently    Birth control/protection: Post-menopausal  Other Topics Concern   Not on file  Social History Narrative   Not on file   Social Determinants of Health   Financial Resource Strain: Not on file  Food Insecurity: Not on file  Transportation Needs: Not on file  Physical Activity: Not on file  Stress: Not on file  Social Connections: Not on file    Tobacco Counseling Current every day smoker. Currently smoking 1/2 pack of cigarette per day.   Diabetic?no         Activities of Daily Living In your present state of health, do you have any difficulty performing the following activities: 01/03/2021 10/03/2020  Hearing? Tempie Donning  Vision? Y N  Difficulty concentrating or making decisions? Y Y  Comment - -  Walking or climbing stairs? N N  Dressing or bathing? N N  Doing errands, shopping? Y Y  Preparing Food and eating ? - -  Using the Toilet? - -  In the past six months, have you accidently leaked urine? - -  Do you have problems with loss of bowel  control? - -  Managing your Medications? - -  Managing your Finances? - -  Housekeeping or managing your Housekeeping? - -  Some recent data might be hidden    Patient Care Team: Ronnell Freshwater, NP as PCP - General (Family Medicine)  Indicate any recent Medical Services you may have received from other than [facility] providers in the past year (date may be approximate).     Assessment:  1. Encounter for Medicare annual wellness exam Annual Medicare wellness visit today.  2. Essential hypertension Blood pressure well managed.  Continue atenolol daily - atenolol (TENORMIN) 25 MG tablet; Take 1 to 2 tablets po QD for HTN  Dispense: 180 tablet; Refill: 3  3. Seizure disorder (HCC) Update lorazepam 1 mg prescription.  Take 1 tablet in the morning.  May take additional 1/2 tablet up to twice daily as needed.  New prescription for 60 tablets with 2 refills sent to her pharmacy. - LORazepam (ATIVAN) 1 MG tablet; Take 1 tablet po QAM. May take additional 1/2 tablet twice daily prn seizures/anxiety  Dispense: 60 tablet; Refill: 2  4. Anxiety, generalized Decreased dosing of alprazolam 0.5 mg tablets to bedtime only.  New prescription for 30 tablets with 2 refills sent to her pharmacy. - ALPRAZolam (XANAX) 0.5 MG tablet; Take 1 tablet po QHS prn  Dispense: 30 tablet; Refill: 2 - LORazepam (ATIVAN) 1 MG tablet; Take 1 tablet po QAM. May take additional 1/2 tablet twice daily prn seizures/anxiety  Dispense: 60 tablet; Refill: 2  5. History of cancer of lower lobe bronchus or lung Patient with positive history of lung cancer.  Has not seen an oncologist or pulmonologist in 2 years.  Will get CT chest lung cancer screening for further evaluation.  Will refer as indicated.  6. Encounter for screening for lung cancer Patient with  positive history of lung cancer.  Has not seen an oncologist or pulmonologist in 2 years.  Will get CT chest lung cancer screening for further evaluation.  Will refer as  indicated. - CT CHEST LUNG CA SCREEN LOW DOSE W/O CM; Future  7. Current every day smoker Patient currently smoking 1/2 pack of cigarettes per day. Patient with positive history of lung cancer.  Has not seen an oncologist or pulmonologist in 2 years.  Will get CT chest lung cancer screening for further evaluation.  Will refer as indicated. - CT CHEST LUNG CA SCREEN LOW DOSE W/O CM; Future  8. Screening for colon cancer Cologuard test ordered today. - Cologuard  9. Encounter for screening mammogram for malignant neoplasm of breast Mammogram ordered. - MM DIGITAL SCREENING BILATERAL; Future    Depression Screen PHQ 2/9 Scores 01/03/2021 10/03/2020 08/01/2020 03/21/2020 09/21/2019 05/24/2019 02/16/2019  PHQ - 2 Score 1 3 0 0 1 0 0  PHQ- 9 Score 2 12 5  - - - -    Fall Risk Fall Risk  10/03/2020 08/01/2020 08/01/2020 03/21/2020 09/21/2019  Falls in the past year? 0 0 0 0 0  Comment - - - - -  Number falls in past yr: 0 - - - -  Comment - - - - -  Injury with Fall? 0 - - - -  Risk for fall due to : - - - No Fall Risks -  Follow up Falls evaluation completed Falls evaluation completed Falls evaluation completed Falls evaluation completed -    FALL RISK PREVENTION PERTAINING TO THE HOME:  Any stairs in or around the home? Yes  If so, are there any without handrails? No  Home free of loose throw rugs in walkways, pet beds, electrical cords, etc? Yes  Adequate lighting in your home to reduce risk of falls? Yes   ASSISTIVE DEVICES UTILIZED TO PREVENT FALLS:  Life alert? No  Use of a cane, walker or w/c? No  Grab bars in the bathroom? Yes  Shower chair or bench in shower? Yes  Elevated toilet seat or a handicapped toilet? No   TIMED UP AND GO:  Was the test performed? Yes .  Length of time to ambulate 10 feet: 25 sec.   Gait steady and fast with assistive device  Cognitive Function: MMSE - Mini Mental State Exam 03/21/2020 02/16/2019 02/12/2018  Orientation to time 5 5 4   Orientation to  Place 5 5 5   Registration 3 3 3   Attention/ Calculation 5 5 5   Recall 3 3 3   Language- name 2 objects 2 2 2   Language- repeat 1 1 1   Language- follow 3 step command 3 3 3   Language- read & follow direction 1 1 1   Write a sentence 1 1 1   Copy design 1 1 1   Total score 30 30 29      6CIT Screen 01/03/2021  What Year? 0 points  What month? 0 points  What time? 0 points  Count back from 20 0 points  Months in reverse 0 points  Repeat phrase 0 points  Total Score 0    Immunizations Immunization History  Administered Date(s) Administered   Influenza-Unspecified 02/27/2018   PFIZER(Purple Top)SARS-COV-2 Vaccination 06/18/2019, 07/09/2019, 03/10/2020    TDAP status: Due, Education has been provided regarding the importance of this vaccine. Advised may receive this vaccine at local pharmacy or Health Dept. Aware to provide a copy of the vaccination record if obtained from local pharmacy or Health Dept. Verbalized acceptance and  understanding.  Flu Vaccine status: Declined, Education has been provided regarding the importance of this vaccine but patient still declined. Advised may receive this vaccine at local pharmacy or Health Dept. Aware to provide a copy of the vaccination record if obtained from local pharmacy or Health Dept. Verbalized acceptance and understanding.  Pneumococcal vaccine status: Declined,  Education has been provided regarding the importance of this vaccine but patient still declined. Advised may receive this vaccine at local pharmacy or Health Dept. Aware to provide a copy of the vaccination record if obtained from local pharmacy or Health Dept. Verbalized acceptance and understanding.   Covid-19 vaccine status: Completed vaccines  Qualifies for Shingles Vaccine? No   Zostavax completed No   Shingrix Completed?: No.    Education has been provided regarding the importance of this vaccine. Patient has been advised to call insurance company to determine out of pocket  expense if they have not yet received this vaccine. Advised may also receive vaccine at local pharmacy or Health Dept. Verbalized acceptance and understanding.  Screening Tests Health Maintenance  Topic Date Due   TETANUS/TDAP  Never done   MAMMOGRAM  10/04/2017   COVID-19 Vaccine (4 - Booster for Pfizer series) 06/02/2020   COLONOSCOPY (Pts 45-14yrs Insurance coverage will need to be confirmed)  10/31/2020   INFLUENZA VACCINE  11/27/2020   Zoster Vaccines- Shingrix (1 of 2) 01/15/2021 (Originally 11/07/1964)   DEXA SCAN  Completed   Hepatitis C Screening  Completed   HPV VACCINES  Aged Out    Health Maintenance  Health Maintenance Due  Topic Date Due   TETANUS/TDAP  Never done   MAMMOGRAM  10/04/2017   COVID-19 Vaccine (4 - Booster for [place] series) 06/02/2020   COLONOSCOPY (Pts 45-6yrs Insurance coverage will need to be confirmed)  10/31/2020   INFLUENZA VACCINE  11/27/2020    Colorectal cancer screening: Type of screening: Cologuard. Completed 01/03/2021. Repeat every 3 years  Mammogram status: Ordered 01/03/2021. Pt provided with contact info and advised to call to schedule appt.   Bone Density status: Completed 2012. Results reflect: Bone density results: NORMAL. Repeat every 0 years.  Lung Cancer Screening: (Low Dose CT Chest recommended if Age 47-80 years, 30 pack-year currently smoking OR have quit w/in 15years.) does qualify.   Lung Cancer Screening Referral: CT Chest ordered 01/03/2021  Additional Screening:  Hepatitis C Screening: does qualify; Completed 2021  Vision Screening: Recommended annual ophthalmology exams for early detection of glaucoma and other disorders of the eye. Is the patient up to date with their annual eye exam?  No  Who is the provider or what is the name of the office in which the patient attends annual eye exams? None  If pt is not established with a provider, would they like to be referred to a provider to establish care? No .   Dental  Screening: Recommended annual dental exams for proper oral hygiene  Community Resource Referral / Chronic Care Management: CRR required this visit?  No   CCM required this visit?  No      Plan:     I have personally reviewed and noted the following in the patient's chart:   Medical and social history Use of alcohol, tobacco or illicit drugs  Current medications and supplements including opioid prescriptions.  Functional ability and status Nutritional status Physical activity Advanced directives List of other physicians Hospitalizations, surgeries, and ER visits in previous 12 months Vitals Screenings to include cognitive, depression, and falls Referrals and appointments  In addition, I have reviewed and discussed with patient certain preventive protocols, quality metrics, and best practice recommendations. A written personalized care plan for preventive services as well as general preventive health recommendations were provided to patient.   Leretha Pol, DNP, FNP-c    01/14/2021

## 2021-01-06 ENCOUNTER — Telehealth: Payer: Self-pay

## 2021-01-06 NOTE — Telephone Encounter (Signed)
Called pt to advise her that she is due to schedule for AWV with PCP. She will need to schedule after 03/21/21 due to last  AWV being on 03/21/20. Pt was given the office contact office to do so.   Candice Camp RMA

## 2021-01-14 DIAGNOSIS — F17200 Nicotine dependence, unspecified, uncomplicated: Secondary | ICD-10-CM | POA: Insufficient documentation

## 2021-01-14 DIAGNOSIS — Z122 Encounter for screening for malignant neoplasm of respiratory organs: Secondary | ICD-10-CM | POA: Insufficient documentation

## 2021-01-14 DIAGNOSIS — Z1211 Encounter for screening for malignant neoplasm of colon: Secondary | ICD-10-CM | POA: Insufficient documentation

## 2021-01-17 DIAGNOSIS — Z1211 Encounter for screening for malignant neoplasm of colon: Secondary | ICD-10-CM | POA: Diagnosis not present

## 2021-01-23 LAB — COLOGUARD: Cologuard: NEGATIVE

## 2021-01-24 NOTE — Progress Notes (Signed)
Please let the patient know that her cologuard results were negative. Thanks

## 2021-01-30 ENCOUNTER — Ambulatory Visit: Payer: Medicare Other | Admitting: Physician Assistant

## 2021-01-31 ENCOUNTER — Encounter: Payer: Self-pay | Admitting: Physician Assistant

## 2021-01-31 ENCOUNTER — Ambulatory Visit (INDEPENDENT_AMBULATORY_CARE_PROVIDER_SITE_OTHER): Payer: Medicare Other | Admitting: Physician Assistant

## 2021-01-31 ENCOUNTER — Other Ambulatory Visit: Payer: Self-pay

## 2021-01-31 VITALS — BP 116/53 | HR 77 | Temp 97.6°F | Ht 60.0 in | Wt 108.4 lb

## 2021-01-31 DIAGNOSIS — S40812A Abrasion of left upper arm, initial encounter: Secondary | ICD-10-CM | POA: Diagnosis not present

## 2021-01-31 DIAGNOSIS — W19XXXA Unspecified fall, initial encounter: Secondary | ICD-10-CM | POA: Diagnosis not present

## 2021-01-31 NOTE — Progress Notes (Signed)
Acute Office Visit  Subjective:    Patient ID: Monica Collins, female    DOB: 1946/01/05, 75 y.o.   MRN: [id number]  Chief Complaint  Patient presents with   Arm Injury    HPI Patient is in today for c/o fall, states fell against cat crate as she was pushing in the chair at the table 2 days ago (Monday). Patient is accompanied by her husband. Reports having back pain which radiates to left side of her ribcage. Denies paresthesia,  bowel or bladder dysfunction. Also has an abrasion on her left arm. Patient's husband states patient has been on benzodiazepine therapy for >20 years and is currently being weaned off medications but with latest dose adjustment noticed changes in mental status and feels like patient has been experiencing withdrawal symptoms. Episodes of altered mental status have been more significant the past couple of days. Patient has been out of her medications (Xanax and Lorazepam) since last week. Patient has a history of seizure disorder and is on Lamictal. Patient's husband reports patient unintentionally took 2-3 tablets of Lamictal 25 mg. Patient had been taking 25 mg, did not need to titrate up. Patient denies recent seizure, chest pain, shortness of breath, dizziness, vision changes, headache, LOC or head trauma.      Past Medical History:  Diagnosis Date   Hypertension     Past Surgical History:  Procedure Laterality Date   CHOLECYSTECTOMY      Family History  Problem Relation Age of Onset   Cancer Sister    Cancer Brother     Social History   Socioeconomic History   Marital status: Married    Spouse name: Not on file   Number of children: Not on file   Years of education: Not on file   Highest education level: Not on file  Occupational History   Not on file  Tobacco Use   Smoking status: Some Days    Packs/day: 12.00    Years: 50.00    Pack years: 600.00    Types: Cigarettes    Last attempt to quit: 10/21/2016    Years since quitting: 4.2    Smokeless tobacco: Never   Tobacco comments:    10 CIGARETTES A DAY   Vaping Use   Vaping Use: Some days  Substance and Sexual Activity   Alcohol use: No    Comment: once a year   Drug use: Never   Sexual activity: Not Currently    Birth control/protection: Post-menopausal  Other Topics Concern   Not on file  Social History Narrative   Not on file   Social Determinants of Health   Financial Resource Strain: Not on file  Food Insecurity: Not on file  Transportation Needs: Not on file  Physical Activity: Not on file  Stress: Not on file  Social Connections: Not on file  Intimate Partner Violence: Not on file    Outpatient Medications Prior to Visit  Medication Sig Dispense Refill   ALPRAZolam (XANAX) 0.5 MG tablet Take 1 tablet po QHS prn 30 tablet 2   Ascorbic Acid (VITAMIN C PO) Take by mouth.     aspirin 81 MG chewable tablet Chew by mouth daily.     atenolol (TENORMIN) 25 MG tablet Take 1 to 2 tablets po QD for HTN 180 tablet 3   lamoTRIgine (LAMICTAL) 25 MG tablet Take 25mg  daily for 2 weeks; then take 25mg  twice a day for 2 weeks; then take 50mg  twice a day for 2 weeks;  then take 75mg  twice a day for 2 weeks; then 100mg  twice a day 240 tablet 3   LORazepam (ATIVAN) 1 MG tablet Take 1 tablet po QAM. May take additional 1/2 tablet twice daily prn seizures/anxiety 60 tablet 2   No facility-administered medications prior to visit.    Allergies  Allergen Reactions   Copper    Copper-Containing Compounds    Shellfish Allergy    Latex Rash    Rubber cleaning gloves used to clean home. But less reactive in recent years Rubber cleaning gloves used to clean home. But less reactive in recent years   Nickel Rash   Soap Rash    Detergent    Review of Systems Review of Systems:  A fourteen system review of systems was performed and found to be positive as per HPI.    Objective:    Physical Exam General:  Pleasant and cooperative, in no acute distress  Neuro:  Alert  and oriented,  extra-ocular muscles intact, no focal deficits  HEENT:  Normocephalic, atraumatic, PERRL, neck supple, no adenopathy  Skin:  approximately 4.5 cm abrasion on left upper arm  Cardiac:  RRR, S1 S2 Respiratory:  CTA B/L, Not using accessory muscles, speaking in full sentences- unlabored. Chest: Normal excursion, tenderness of lower ribcage (left sided) MSK: tenderness of paraspinal lumbar spine, no step-off or deformity noted, good ROM of UE and LE, good grip strength  Vascular:  Ext warm, no cyanosis apprec.; cap RF less 2 sec. Psych:  No HI/SI, judgement and insight good, Euthymic mood. Full Affect.  BP (!) 116/53   Pulse 77   Temp 97.6 F (36.4 C)   Ht 5' (1.524 m)   Wt 108 lb 6.4 oz (49.2 kg)   SpO2 96%   BMI 21.17 kg/m  Wt Readings from Last 3 Encounters:  01/31/21 108 lb 6.4 oz (49.2 kg)  01/03/21 110 lb 14.4 oz (50.3 kg)  11/14/20 112 lb (50.8 kg)    Health Maintenance Due  Topic Date Due   TETANUS/TDAP  Never done   Zoster Vaccines- Shingrix (1 of 2) Never done   MAMMOGRAM  10/04/2017   COVID-19 Vaccine (4 - Booster for Pfizer series) 06/02/2020    There are no preventive care reminders to display for this patient.   No results found for: TSH Lab Results  Component Value Date   WBC 7.4 05/13/2017   HGB 14.5 05/13/2017   HCT 42.0 05/13/2017   MCV 92.7 05/13/2017   PLT 194 05/13/2017   Lab Results  Component Value Date   NA 139 05/13/2017   K 4.3 05/13/2017   CO2 28 05/13/2017   GLUCOSE 97 05/13/2017   BUN 11 05/13/2017   CREATININE 0.77 05/13/2017   BILITOT 0.8 05/13/2017   ALKPHOS 85 05/13/2017   AST 33 05/13/2017   ALT 26 05/13/2017   PROT 7.1 05/13/2017   ALBUMIN 4.3 05/13/2017   CALCIUM 9.3 05/13/2017   ANIONGAP 9 05/13/2017   No results found for: CHOL No results found for: HDL No results found for: LDLCALC No results found for: TRIG No results found for: CHOLHDL No results found for: HGBA1C     Assessment & Plan:    Problem List Items Addressed This Visit   None Visit Diagnoses     Fall, initial encounter    -  Primary   Abrasion of skin of left upper arm          No red flag s/s present concerning for severe head  injury so will defer imaging at this time. Fall likely multi-factorial from medications (taking higher dose of Lamictal) and withdrawal symptoms. Patient's husband has started taking care of medications. Discussed with patient likely bruised ribcage from fall, recommend conservative therapy including heat therapy. If symptoms fail to improve or worsen then recommend obtaining imaging studies (chest x-ray and/or head CT w/o contrast). Patient has been on benzodiazepine for an extended period of time and would benefit from a slower taper off alprazolam, will defer to patient's PCP and advised to schedule a 1 week follow up to further discuss.   Abrasion of skin of left upper arm: -Cleaned and dressed in office.  -Recommend to keep area clean, apply non-adhesive dressing and use topical antibiotic cream.   No orders of the defined types were placed in this encounter.    Lorrene Reid, PA-C

## 2021-02-06 ENCOUNTER — Encounter: Payer: Self-pay | Admitting: Nurse Practitioner

## 2021-02-06 ENCOUNTER — Other Ambulatory Visit: Payer: Self-pay

## 2021-02-06 ENCOUNTER — Ambulatory Visit (INDEPENDENT_AMBULATORY_CARE_PROVIDER_SITE_OTHER): Payer: Medicare Other | Admitting: Nurse Practitioner

## 2021-02-06 VITALS — BP 144/80 | HR 65 | Temp 97.5°F | Ht 60.0 in | Wt 109.4 lb

## 2021-02-06 DIAGNOSIS — G40909 Epilepsy, unspecified, not intractable, without status epilepticus: Secondary | ICD-10-CM | POA: Diagnosis not present

## 2021-02-06 DIAGNOSIS — Y92009 Unspecified place in unspecified non-institutional (private) residence as the place of occurrence of the external cause: Secondary | ICD-10-CM | POA: Insufficient documentation

## 2021-02-06 DIAGNOSIS — I10 Essential (primary) hypertension: Secondary | ICD-10-CM

## 2021-02-06 DIAGNOSIS — S40812A Abrasion of left upper arm, initial encounter: Secondary | ICD-10-CM | POA: Diagnosis not present

## 2021-02-06 DIAGNOSIS — I69311 Memory deficit following cerebral infarction: Secondary | ICD-10-CM | POA: Diagnosis not present

## 2021-02-06 DIAGNOSIS — F411 Generalized anxiety disorder: Secondary | ICD-10-CM

## 2021-02-06 DIAGNOSIS — W19XXXA Unspecified fall, initial encounter: Secondary | ICD-10-CM | POA: Insufficient documentation

## 2021-02-06 DIAGNOSIS — W19XXXD Unspecified fall, subsequent encounter: Secondary | ICD-10-CM

## 2021-02-06 DIAGNOSIS — R0789 Other chest pain: Secondary | ICD-10-CM

## 2021-02-06 DIAGNOSIS — R0781 Pleurodynia: Secondary | ICD-10-CM | POA: Diagnosis not present

## 2021-02-06 MED ORDER — LIDOCAINE 5 % EX PTCH: MEDICATED_PATCH | CUTANEOUS | 0 refills | Status: DC

## 2021-02-06 NOTE — Progress Notes (Signed)
Established Patient Office Visit  Subjective:  Patient ID: Monica Collins, female    DOB: 01-15-1946  Age: 75 y.o. MRN: [id number]  CC:  Chief Complaint  Patient presents with   Fall     HPI Monica Collins presents for follow up of fall. This s subsequent encounter. Patient was seen in the office on 01/31/2021. Fall believed to be multi factorial in nature. The patient presents with her oldest daughter today. Daughter states that the day of the fall, patient had taken too much of her blood pressure medication and had mixed up dosing of alprazolam/lorazepam. She may have also taken extra doses of lamictal. When asked, patient states that she is unsure what medication she had taken at that time. Patient did get abrasion to left upper arm which had been cleaned. Antibioitc ointment applied, and dressed with coban. Patient admits to changing the bandage one time since her visit here on 01/31/2021. She does have some soreness to the abrasion. She states that she also has some soreness around the left rib cage area. States that the pain here is 5/10 in severity during the day and a 10/10 in severity at night. States that she is unable to sleep due to the severity of pain. She is not currently taking or using anything to relieve pain.  The patient lives with her husband. Patient's daughter states that up until last week, patient was managing medication on her own. Was not helping keep track of dosing and frequency of medications. The patient's daughter, patient, and husband have discussed this and the husband is now controlling the dosing of medications. Between them, they have made an agreement that if there are further problems with medications, the patient will go to live with her oldest daughter. The patient seems to be in agreement with this plan. Unsure if her husband is no board with this.  The patient states that she is doing better today than at most recent visit. No new concerns or complaints.    Past Medical History:  Diagnosis Date   Hypertension     Past Surgical History:  Procedure Laterality Date   CHOLECYSTECTOMY      Family History  Problem Relation Age of Onset   Cancer Sister    Cancer Brother     Social History   Socioeconomic History   Marital status: Married    Spouse name: Not on file   Number of children: Not on file   Years of education: Not on file   Highest education level: Not on file  Occupational History   Not on file  Tobacco Use   Smoking status: Some Days    Packs/day: 12.00    Years: 50.00    Pack years: 600.00    Types: Cigarettes    Last attempt to quit: 10/21/2016    Years since quitting: 4.2   Smokeless tobacco: Never   Tobacco comments:    10 CIGARETTES A DAY   Vaping Use   Vaping Use: Some days  Substance and Sexual Activity   Alcohol use: No    Comment: once a year   Drug use: Never   Sexual activity: Not Currently    Birth control/protection: Post-menopausal  Other Topics Concern   Not on file  Social History Narrative   Not on file   Social Determinants of Health   Financial Resource Strain: Not on file  Food Insecurity: Not on file  Transportation Needs: Not on file  Physical Activity: Not on  file  Stress: Not on file  Social Connections: Not on file  Intimate Partner Violence: Not on file    Outpatient Medications Prior to Visit  Medication Sig Dispense Refill   ALPRAZolam (XANAX) 0.5 MG tablet Take 1 tablet po QHS prn 30 tablet 2   Ascorbic Acid (VITAMIN C PO) Take by mouth.     aspirin 81 MG chewable tablet Chew by mouth daily.     atenolol (TENORMIN) 25 MG tablet Take 1 to 2 tablets po QD for HTN 180 tablet 3   lamoTRIgine (LAMICTAL) 25 MG tablet Take 25mg  daily for 2 weeks; then take 25mg  twice a day for 2 weeks; then take 50mg  twice a day for 2 weeks; then take 75mg  twice a day for 2 weeks; then 100mg  twice a day 240 tablet 3   LORazepam (ATIVAN) 1 MG tablet Take 1 tablet po QAM. May take additional  1/2 tablet twice daily prn seizures/anxiety 60 tablet 2   No facility-administered medications prior to visit.    Allergies  Allergen Reactions   Copper    Copper-Containing Compounds    Shellfish Allergy    Latex Rash    Rubber cleaning gloves used to clean home. But less reactive in recent years Rubber cleaning gloves used to clean home. But less reactive in recent years   Nickel Rash   Soap Rash    Detergent    ROS Review of Systems  Constitutional:  Positive for fatigue. Negative for activity change, appetite change, chills and fever.  HENT:  Negative for congestion, postnasal drip, rhinorrhea, sinus pressure, sinus pain, sneezing and sore throat.   Eyes: Negative.   Respiratory:  Negative for cough, chest tightness, shortness of breath and wheezing.   Cardiovascular:  Negative for chest pain and palpitations.  Gastrointestinal:  Negative for abdominal pain, constipation, diarrhea, nausea and vomiting.  Endocrine: Negative for cold intolerance, heat intolerance, polydipsia and polyuria.  Genitourinary:  Negative for dyspareunia, dysuria, flank pain, frequency and urgency.  Musculoskeletal:  Negative for arthralgias, back pain and myalgias.       Patient has soreness of left rib cage.  Skin:  Negative for rash.       Abrasion to left upper arm tender.  Patient admits to changing management 1 time since her recent visit.  Allergic/Immunologic: Negative for environmental allergies.  Neurological:  Positive for seizures. Negative for dizziness, weakness and headaches.       Memory decline.  Hematological:  Negative for adenopathy.  Psychiatric/Behavioral:  Positive for sleep disturbance. The patient is nervous/anxious.      Objective:    Physical Exam Vitals and nursing note reviewed.  Constitutional:      Appearance: Normal appearance. She is well-developed.  HENT:     Head: Normocephalic and atraumatic.     Nose: Nose normal.     Mouth/Throat:     Mouth: Mucous  membranes are moist.  Eyes:     Extraocular Movements: Extraocular movements intact.     Conjunctiva/sclera: Conjunctivae normal.     Pupils: Pupils are equal, round, and reactive to light.  Cardiovascular:     Rate and Rhythm: Normal rate and regular rhythm.     Pulses: Normal pulses.     Heart sounds: Normal heart sounds.  Pulmonary:     Effort: Pulmonary effort is normal.     Breath sounds: Normal breath sounds.  Abdominal:     Palpations: Abdomen is soft.  Musculoskeletal:        General: Normal  range of motion.     Cervical back: Normal range of motion and neck supple.     Comments: There is tenderness with light and moderate palpation of the left cage.  No bruising or swelling present.  No crepitus can be felt with palpation.  Lymphadenopathy:     Cervical: No cervical adenopathy.  Skin:    General: Skin is warm and dry.     Capillary Refill: Capillary refill takes less than 2 seconds.     Comments: Unbandaged abrasion to left posterior arm.  Edges of wound are well approximated.  There is some bruising present with palpation.  No drainage at this point.  No evidence of infection.  Neurological:     General: No focal deficit present.     Mental Status: She is alert and oriented to person, place, and time. Mental status is at baseline.  Psychiatric:        Attention and Perception: Attention and perception normal.        Mood and Affect: Mood is anxious and depressed.        Speech: Speech normal.        Behavior: Behavior normal. Behavior is cooperative.        Thought Content: Thought content normal.        Cognition and Memory: Cognition normal.        Judgment: Judgment normal.   Today's Vitals   02/06/21 1141  BP: (!) 144/80  Pulse: 65  Temp: (!) 97.5 F (36.4 C)  SpO2: 97%  Weight: 109 lb 6.4 oz (49.6 kg)   Body mass index is 21.37 kg/m.   Wt Readings from Last 3 Encounters:  02/06/21 109 lb 6.4 oz (49.6 kg)  01/31/21 108 lb 6.4 oz (49.2 kg)  01/03/21 110  lb 14.4 oz (50.3 kg)     Health Maintenance Due  Topic Date Due   TETANUS/TDAP  Never done   Zoster Vaccines- Shingrix (1 of 2) Never done   MAMMOGRAM  10/04/2017   COVID-19 Vaccine (4 - Booster for Pfizer series) 06/02/2020    There are no preventive care reminders to display for this patient.  No results found for: TSH Lab Results  Component Value Date   WBC 7.4 05/13/2017   HGB 14.5 05/13/2017   HCT 42.0 05/13/2017   MCV 92.7 05/13/2017   PLT 194 05/13/2017   Lab Results  Component Value Date   NA 139 05/13/2017   K 4.3 05/13/2017   CO2 28 05/13/2017   GLUCOSE 97 05/13/2017   BUN 11 05/13/2017   CREATININE 0.77 05/13/2017   BILITOT 0.8 05/13/2017   ALKPHOS 85 05/13/2017   AST 33 05/13/2017   ALT 26 05/13/2017   PROT 7.1 05/13/2017   ALBUMIN 4.3 05/13/2017   CALCIUM 9.3 05/13/2017   ANIONGAP 9 05/13/2017   No results found for: CHOL No results found for: HDL No results found for: LDLCALC No results found for: TRIG No results found for: CHOLHDL No results found for: HGBA1C    Assessment & Plan:  1. Fall in home, subsequent encounter Patient condition improving since visit 01/31/2021.  Does have soreness of left rib cage.  New prescription for Lidoderm patches sent to pharmacy.  She should apply to affected area at night for 12 hours.  She should then remove and leave off for 12 hours.  Recommend she take Tylenol during the day to help with pain management. - lidocaine (LIDODERM) 5 %; Apply externally to effected areas. Remove &  Discard patch within 12 hours or as directed by MD  Dispense: 30 patch; Refill: 0  2. Abrasion of skin of left upper arm Unwrapped wound which appears to be healing well.  Edges are not well approximated.  Slight erythema present without warmth or evidence of infection at this time.  Advised patient and family members to monitor closely and notify office if condition changes and evidence of infection is produced.  3. Rib pain on left  side Left rib pain after fall.  Slight bruising without swelling.  No crepitus can be felt.  Prescription for Lidoderm patch sent to pharmacy.  May use 1 patch daily.  Should leave in place for 12 hours and remove for subsequent 12 hours.  May take Tylenol as needed and as indicated for pain. - lidocaine (LIDODERM) 5 %; Apply externally to effected areas. Remove & Discard patch within 12 hours or as directed by MD  Dispense: 30 patch; Refill: 0  4. Anxiety, generalized It is thought that recent fall may have been resulted from weaning down dose of alprazolam and onto lorazepam too quickly.  Will refer to psychiatry for further evaluation and treatment. - Ambulatory referral to Psychiatry  5. Seizure disorder [facility]) Patient currently on Lamictal with a dose of 2 tablets daily.  We will continue this current dosing.  Encouraged her and family members to contact the neurologist for further evaluation   6. Memory deficit after cerebral infarction Encouraged the patient and her family to discuss memory deficit with neurologist at next visit.  They voiced understanding and agreement.  7. Essential hypertension Generally stable.  Continue blood pressure medication as prescribed.  Problem List Items Addressed This Visit       Cardiovascular and Mediastinum   Essential hypertension     Nervous and Auditory   Seizure disorder [facility])   Relevant Orders   Ambulatory referral to Psychiatry     Musculoskeletal and Integument   Abrasion of skin of left upper arm     Other   Anxiety, generalized   Relevant Orders   Ambulatory referral to Psychiatry   Fall at home - Primary   Relevant Medications   lidocaine (LIDODERM) 5 %   Rib pain on left side   Relevant Medications   lidocaine (LIDODERM) 5 %   Memory deficit after cerebral infarction    Meds ordered this encounter  Medications   lidocaine (LIDODERM) 5 %    Sig: Apply externally to effected areas. Remove & Discard patch within 12 hours  or as directed by MD    Dispense:  30 patch    Refill:  0    Order Specific Question:   Supervising Provider    Answer:   Beatrice Lecher D [[id number]]     Follow-up: Return in about 4 weeks (around 03/06/2021) for medication.    Ronnell Freshwater, NP  This note was dictated using Systems analyst. Rapid proofreading was performed to expedite the delivery of the information. Despite proofreading, phonetic errors will occur which are common with this voice recognition software. Please take this into consideration. If there are any concerns, please contact our office.

## 2021-02-06 NOTE — Patient Instructions (Addendum)
You are taking the following medications: Atenolol 51m - one to two tablets at bedtime for blood pressure Lorazepam 1159m- take one tablet in the AM. May take 1 tablet in afternoon as needed Alprazolam 0.59m59mablets - take one tablet at bedtime as needed Lamictal 259m79mtake one tablet po twice daily I have referred you to psychiatry.  You should schedule a follow up with neurologist for follow up I will see you back in 4 weeks     Understanding Your Risk for Falls Each year, millions of people have serious injuries from falls. It is important to understand your risk for falling. Talk with your health care provider about your risk and what you can do to lower it. There are actions you can take at home to lower your risk and prevent falls. If you do have a serious fall, make sure to tell your health care provider. Falling once raises your risk of falling again. How can falls affect me? Serious injuries from falls are common. These include: Broken bones, such as hip fractures. Head injuries, such as traumatic brain injuries (TBI) or concussion. A fear of falling can cause you to avoid activities and stay at home. This can make your muscles weaker and actually raise your risk for a fall. What can increase my risk? There are a number of risk factors that increase your risk for falling. The more risk factors you have, the higher your risk of falling. Serious injuries from a fall happen most often to people older than age 88. 17ildren and young adults ages 15-249-29 also at higher risk. Common risk factors include: Weakness in the lower body. Lack (deficiency) of vitamin D. Being generally weak or confused due to long-term (chronic) illness. Dizziness or balance problems. Poor vision. Medicines that cause dizziness or drowsiness. These can include medicines for your blood pressure, heart, anxiety, insomnia, or edema, as well as pain medicines and muscle relaxants. Other risk factors  include: Drinking alcohol. Having had a fall in the past. Having depression. Having foot pain or wearing improper footwear. Working at a dangerous job. Having any of the following in your home: Tripping hazards, such as floor clutter or loose rugs. Poor lighting. Pets. Having dementia or memory loss. What actions can I take to lower my risk of falling?   Physical activity Maintain physical fitness. Do strength and balance exercises. Consider taking a regular class to build strength and balance. Yoga and tai chi are good options. Vision Have your eyes checked every year and your vision prescription updated as needed. Walking aids and footwear Wear nonskid shoes. Do not wear high heels. Do not walk around the house in socks or slippers. Use a cane or walker as told by your health care provider. Home safety Attach secure railings on both sides of your stairs. Install grab bars for your tub, shower, and toilet. Use a bath mat in your tub or shower. Use good lighting in all rooms. Keep a flashlight near your bed. Make sure there is a clear path from your bed to the bathroom. Use night-lights. Do not use throw rugs. Make sure all carpeting is taped or tacked down securely. Remove all clutter from walkways and stairways, including extension cords. Repair uneven or broken steps. Avoid walking on icy or slippery surfaces. Walk on the grass instead of on icy or slick sidewalks. Use ice melt to get rid of ice on walkways. Use a cordless phone. Questions to ask your health care provider Can  you help me check my risk for a fall? Do any of my medicines make me more likely to fall? Should I take a vitamin D supplement? What exercises can I do to improve my strength and balance? Should I make an appointment to have my vision checked? Do I need a bone density test to check for weak bones or osteoporosis? Would it help to use a cane or a walker? Where to find more information Centers for  Disease Control and Prevention, [employer]: http://www.wolf.info/ Community-Based Fall Prevention Programs: http://www.wolf.info/ National Institute on Aging: http://kim-miller.com/ Contact a health care provider if: You fall at home. You are afraid of falling at home. You feel weak, drowsy, or dizzy. Summary Serious injuries from a fall happen most often to people older than age 85. Children and young adults ages 96-29 are also at higher risk. Talk with your health care provider about your risks for falling and how to lower those risks. Taking certain precautions at home can lower your risk for falling. If you fall, always tell your health care provider. This information is not intended to replace advice given to you by your health care provider. Make sure you discuss any questions you have with your health care provider. Document Revised: 11/17/2019 Document Reviewed: 11/17/2019 Elsevier Patient Education  [place].

## 2021-02-12 ENCOUNTER — Other Ambulatory Visit: Payer: Self-pay | Admitting: Diagnostic Neuroimaging

## 2021-02-12 DIAGNOSIS — G40909 Epilepsy, unspecified, not intractable, without status epilepticus: Secondary | ICD-10-CM

## 2021-02-12 MED ORDER — LAMOTRIGINE 100 MG PO TABS: 100.0000 mg | ORAL_TABLET | Freq: Two times a day (BID) | ORAL | 5 refills | Status: DC

## 2021-03-13 ENCOUNTER — Ambulatory Visit (INDEPENDENT_AMBULATORY_CARE_PROVIDER_SITE_OTHER): Payer: Medicare Other | Admitting: Nurse Practitioner

## 2021-03-13 ENCOUNTER — Other Ambulatory Visit: Payer: Self-pay

## 2021-03-13 ENCOUNTER — Encounter: Payer: Self-pay | Admitting: Nurse Practitioner

## 2021-03-13 VITALS — BP 137/72 | HR 71 | Temp 97.7°F | Ht 60.0 in | Wt 110.2 lb

## 2021-03-13 DIAGNOSIS — G40909 Epilepsy, unspecified, not intractable, without status epilepticus: Secondary | ICD-10-CM | POA: Diagnosis not present

## 2021-03-13 DIAGNOSIS — F411 Generalized anxiety disorder: Secondary | ICD-10-CM | POA: Diagnosis not present

## 2021-03-13 DIAGNOSIS — K580 Irritable bowel syndrome with diarrhea: Secondary | ICD-10-CM | POA: Insufficient documentation

## 2021-03-13 DIAGNOSIS — F17209 Nicotine dependence, unspecified, with unspecified nicotine-induced disorders: Secondary | ICD-10-CM

## 2021-03-13 DIAGNOSIS — I10 Essential (primary) hypertension: Secondary | ICD-10-CM | POA: Diagnosis not present

## 2021-03-13 MED ORDER — LORAZEPAM 1 MG PO TABS: ORAL_TABLET | ORAL | 2 refills | Status: DC

## 2021-03-13 MED ORDER — ALPRAZOLAM 0.5 MG PO TABS: ORAL_TABLET | ORAL | 2 refills | Status: DC

## 2021-03-13 MED ORDER — DICYCLOMINE HCL 10 MG PO CAPS: 10.0000 mg | ORAL_CAPSULE | Freq: Three times a day (TID) | ORAL | 2 refills | Status: DC | PRN

## 2021-03-13 NOTE — Progress Notes (Signed)
Established Patient Office Visit  Subjective:  Patient ID: Monica Collins, female    DOB: 02-20-1946  Age: 75 y.o. MRN: [id number]  CC:  Chief Complaint  Patient presents with   Follow-up   Medication Refill    HPI Monica Collins presents for routine follow up. She states that ibs is really acting up. She is having frequent episodes of diarrhea. Feels like she is confined to the house due to the amount of diarrhea she has. She is having increased situational and personal stress. She believes that this is causing increase in her IBS. She states that she does take imodium as needed. This medication does help but causes constipation.  She states that she has not had any seizure like activity since her last visit. Her husband is now controlling administration of her medication. Together, they have weaned her off of Lamictal as it was causing her to feel dizzy and lethargic. He states that since he took over control of medications from her, there have been no further problems with medication mix ups or errors. She has had no further absent seizures. She is currently taking alprazolam 0.5mg  at bedtime. She does take lorazepam 1mg  in the morning and has an extra 1mg  tablet to take if needed during the day to take for increased anxiety or seizure like activity. Our goal is for her to come completely off of alprazolam and take lorazepam only for anxiety and seizure activity. She does have Controlled substances policy in place for Primary [facility] at [facility]. I reviewed her PDMP profile. Her overdose risk score is 120 and her fill history is appropriate.   Past Medical History:  Diagnosis Date   Hypertension     Past Surgical History:  Procedure Laterality Date   CHOLECYSTECTOMY      Family History  Problem Relation Age of Onset   Cancer Sister    Cancer Brother     Social History   Socioeconomic History   Marital status: Married    Spouse name: Not on file   Number of children: Not on  file   Years of education: Not on file   Highest education level: Not on file  Occupational History   Not on file  Tobacco Use   Smoking status: Some Days    Packs/day: 12.00    Years: 50.00    Pack years: 600.00    Types: Cigarettes    Last attempt to quit: 10/21/2016    Years since quitting: 4.3   Smokeless tobacco: Never   Tobacco comments:    10 CIGARETTES A DAY   Vaping Use   Vaping Use: Some days  Substance and Sexual Activity   Alcohol use: No    Comment: once a year   Drug use: Never   Sexual activity: Not Currently    Birth control/protection: Post-menopausal  Other Topics Concern   Not on file  Social History Narrative   Not on file   Social Determinants of Health   Financial Resource Strain: Not on file  Food Insecurity: Not on file  Transportation Needs: Not on file  Physical Activity: Not on file  Stress: Not on file  Social Connections: Not on file  Intimate Partner Violence: Not on file    Outpatient Medications Prior to Visit  Medication Sig Dispense Refill   Ascorbic Acid (VITAMIN C PO) Take by mouth.     aspirin 81 MG chewable tablet Chew by mouth daily.     atenolol (TENORMIN) 25  MG tablet Take 1 to 2 tablets po QD for HTN 180 tablet 3   lamoTRIgine (LAMICTAL) 100 MG tablet Take 1 tablet (100 mg total) by mouth 2 (two) times daily. 60 tablet 5   lidocaine (LIDODERM) 5 % Apply externally to effected areas. Remove & Discard patch within 12 hours or as directed by MD 30 patch 0   ALPRAZolam (XANAX) 0.5 MG tablet Take 1 tablet po QHS prn 30 tablet 2   LORazepam (ATIVAN) 1 MG tablet Take 1 tablet po QAM. May take additional 1/2 tablet twice daily prn seizures/anxiety 60 tablet 2   No facility-administered medications prior to visit.    Allergies  Allergen Reactions   Copper    Copper-Containing Compounds    Shellfish Allergy    Latex Rash    Rubber cleaning gloves used to clean home. But less reactive in recent years Rubber cleaning gloves used  to clean home. But less reactive in recent years   Nickel Rash   Soap Rash    Detergent    ROS Review of Systems  Constitutional:  Positive for activity change and fatigue. Negative for appetite change, chills and fever.       The patient reports being more homebound since IBS started to become a problem again .  HENT:  Negative for congestion, postnasal drip, rhinorrhea, sinus pressure, sinus pain, sneezing and sore throat.   Eyes: Negative.   Respiratory:  Negative for cough, chest tightness, shortness of breath and wheezing.   Cardiovascular:  Negative for chest pain and palpitations.  Gastrointestinal:  Positive for abdominal pain and diarrhea. Negative for constipation, nausea and vomiting.  Endocrine: Negative for cold intolerance, heat intolerance, polydipsia and polyuria.  Genitourinary:  Negative for dyspareunia, dysuria, flank pain, frequency and urgency.  Musculoskeletal:  Negative for arthralgias, back pain and myalgias.  Skin:  Negative for rash.  Allergic/Immunologic: Negative for environmental allergies.  Neurological:  Positive for seizures. Negative for dizziness, weakness and headaches.       Patient and her husband state that she has not had seizure like activity in past 5 weeks .  Hematological:  Negative for adenopathy.  Psychiatric/Behavioral:  Positive for sleep disturbance. The patient is nervous/anxious.      Objective:    Physical Exam Vitals and nursing note reviewed.  Constitutional:      Appearance: Normal appearance. She is well-developed.  HENT:     Head: Normocephalic and atraumatic.     Nose: Nose normal.     Mouth/Throat:     Mouth: Mucous membranes are moist.  Eyes:     Extraocular Movements: Extraocular movements intact.     Conjunctiva/sclera: Conjunctivae normal.     Pupils: Pupils are equal, round, and reactive to light.  Cardiovascular:     Rate and Rhythm: Normal rate and regular rhythm.     Pulses: Normal pulses.     Heart sounds:  Normal heart sounds.  Pulmonary:     Effort: Pulmonary effort is normal.     Breath sounds: Normal breath sounds.  Abdominal:     General: Abdomen is flat. Bowel sounds are increased.     Palpations: Abdomen is soft.     Tenderness: There is generalized abdominal tenderness.  Musculoskeletal:        General: Normal range of motion.     Cervical back: Normal range of motion and neck supple.  Lymphadenopathy:     Cervical: No cervical adenopathy.  Skin:    General: Skin is warm and  dry.     Capillary Refill: Capillary refill takes less than 2 seconds.  Neurological:     General: No focal deficit present.     Mental Status: She is alert and oriented to person, place, and time.  Psychiatric:        Attention and Perception: Attention and perception normal.        Mood and Affect: Mood is depressed. Affect is tearful.        Speech: Speech normal.        Behavior: Behavior normal. Behavior is cooperative.        Thought Content: Thought content normal.        Cognition and Memory: Cognition and memory normal.        Judgment: Judgment normal.    Today's Vitals   03/13/21 1124  BP: 137/72  Pulse: 71  Temp: 97.7 F (36.5 C)  SpO2: 92%  Weight: 110 lb 3.2 oz (50 kg)  Height: 5' (1.524 m)   Body mass index is 21.52 kg/m.   Wt Readings from Last 3 Encounters:  03/13/21 110 lb 3.2 oz (50 kg)  02/06/21 109 lb 6.4 oz (49.6 kg)  01/31/21 108 lb 6.4 oz (49.2 kg)     Health Maintenance Due  Topic Date Due   MAMMOGRAM  10/04/2017    There are no preventive care reminders to display for this patient.  No results found for: TSH Lab Results  Component Value Date   WBC 7.4 05/13/2017   HGB 14.5 05/13/2017   HCT 42.0 05/13/2017   MCV 92.7 05/13/2017   PLT 194 05/13/2017   Lab Results  Component Value Date   NA 139 05/13/2017   K 4.3 05/13/2017   CO2 28 05/13/2017   GLUCOSE 97 05/13/2017   BUN 11 05/13/2017   CREATININE 0.77 05/13/2017   BILITOT 0.8 05/13/2017    ALKPHOS 85 05/13/2017   AST 33 05/13/2017   ALT 26 05/13/2017   PROT 7.1 05/13/2017   ALBUMIN 4.3 05/13/2017   CALCIUM 9.3 05/13/2017   ANIONGAP 9 05/13/2017   No results found for: CHOL No results found for: HDL No results found for: LDLCALC No results found for: TRIG No results found for: CHOLHDL No results found for: HGBA1C    Assessment & Plan:  1. Essential hypertension Stable. Continue atenolol as prescribed   2. Irritable bowel syndrome with diarrhea Trial dicyclomine 10mg . She may take up to three times daily as needed for intestinal cramping and diarrhea. Advised her to increase fiber in her diet. She is taking pro/prebiotic.  - dicyclomine (BENTYL) 10 MG capsule; Take 1 capsule (10 mg total) by mouth 3 (three) times daily as needed for spasms.  Dispense: 90 capsule; Refill: 2  3. Seizure disorder [facility]) Patient states she is no longer taking lamictal. She does take lorazepam 1mg  in the morning. She does have 1mg  tablet to take in afternoons as needed. Advised she take only 1/2 tablet in afternoon and evening if needed.  I did advise them that if errors occurred causing patient to run out of medications too quickly, the referral to psychiatry would be required. Up to this point, patient has declined psychiatric services referrals. She and her husband both voiced understanding and agreement - LORazepam (ATIVAN) 1 MG tablet; Take 1 tablet po QAM. Mmay take 1 tablet QPM as needed  Dispense: 60 tablet; Refill: 2  4. Anxiety, generalized She may continue to take alprazolam 0.5mg  at bedtime as needed. Our goal is to  begin weaning off this dose and have her completely off alprazolam in the early part of 2023.  - ALPRAZolam (XANAX) 0.5 MG tablet; Take 1 tablet po QHS prn  Dispense: 30 tablet; Refill: 2 - LORazepam (ATIVAN) 1 MG tablet; Take 1 tablet po QAM. Mmay take 1 tablet QPM as needed  Dispense: 60 tablet; Refill: 2  5. Tobacco use disorder, continuous Patient continues to  smoke cigarettes and is not ready to quit at this time.    Problem List Items Addressed This Visit       Cardiovascular and Mediastinum   Essential hypertension - Primary     Digestive   Irritable bowel syndrome with diarrhea   Relevant Medications   dicyclomine (BENTYL) 10 MG capsule     Nervous and Auditory   Seizure disorder (HCC)   Relevant Medications   LORazepam (ATIVAN) 1 MG tablet     Other   Anxiety, generalized   Relevant Medications   ALPRAZolam (XANAX) 0.5 MG tablet   LORazepam (ATIVAN) 1 MG tablet   Tobacco use disorder, continuous    Meds ordered this encounter  Medications   ALPRAZolam (XANAX) 0.5 MG tablet    Sig: Take 1 tablet po QHS prn    Dispense:  30 tablet    Refill:  2    Reduced to 1 time at night and #30 tablets    Order Specific Question:   Supervising Provider    Answer:   Beatrice Lecher D [[id number]]   LORazepam (ATIVAN) 1 MG tablet    Sig: Take 1 tablet po QAM. Mmay take 1 tablet QPM as needed    Dispense:  60 tablet    Refill:  2    Please note change in dosing and amount of tablets per prescription.    Order Specific Question:   Supervising Provider    Answer:   Hali Marry [[id number]]   dicyclomine (BENTYL) 10 MG capsule    Sig: Take 1 capsule (10 mg total) by mouth 3 (three) times daily as needed for spasms.    Dispense:  90 capsule    Refill:  2    Order Specific Question:   Supervising Provider    Answer:   Beatrice Lecher D [[id number]]     Follow-up: Return in about 3 months (around 06/13/2021) for ibs, anxiety .    Ronnell Freshwater, NP

## 2021-04-27 DIAGNOSIS — Z20822 Contact with and (suspected) exposure to covid-19: Secondary | ICD-10-CM | POA: Diagnosis not present

## 2021-05-21 ENCOUNTER — Ambulatory Visit (INDEPENDENT_AMBULATORY_CARE_PROVIDER_SITE_OTHER): Payer: Medicare HMO | Admitting: Diagnostic Neuroimaging

## 2021-05-21 ENCOUNTER — Other Ambulatory Visit: Payer: Self-pay

## 2021-05-21 ENCOUNTER — Encounter: Payer: Self-pay | Admitting: Diagnostic Neuroimaging

## 2021-05-21 VITALS — BP 150/66 | HR 55 | Ht 60.0 in | Wt 112.6 lb

## 2021-05-21 DIAGNOSIS — G40909 Epilepsy, unspecified, not intractable, without status epilepticus: Secondary | ICD-10-CM

## 2021-05-21 DIAGNOSIS — I6930 Unspecified sequelae of cerebral infarction: Secondary | ICD-10-CM

## 2021-05-21 MED ORDER — LAMOTRIGINE 25 MG PO TABS: ORAL_TABLET | ORAL | 3 refills | Status: DC

## 2021-05-21 NOTE — Progress Notes (Signed)
[facility]  PATIENT: Monica Collins DOB: 1945-11-01  REFERRING CLINICIAN: Ronnell Freshwater, NP HISTORY FROM: patient and daughter (Monica Collins) REASON FOR VISIT: follow up   HISTORICAL  CHIEF COMPLAINT:  Chief Complaint  Patient presents with   Seizures    Rm 7, 6 month FU  "never got up to 100 mg lamictal, husband stopped medication-didn't think she needed it, she has just restarted lamictal 25 mg, has had seizures, last one was 05/14/21"    HISTORY OF PRESENT ILLNESS:   UPDATE (05/21/21, VRP): Since last visit, tried lamotrigine for a while, then stopped b/c husband thought sz were worsening. But ws tapering off benzos at that time also. Last sz ~05/12/21; now restarted lamotrigine after that sz.   PRIOR HPI (11/14/20): 76 year old female here for evaluation of seizures.  2018 patient was being treated for stage II lung cancer and had screening MRI of the brain.  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  She underwent craniotomy and aneurysm clipping on 10/23/2016.  Patient had a postoperative stroke affecting right MCA region.  Patient then had clinical and electrographic seizures in the hospital.  Patient had confusion, staring spell, abnormal movements of her left arm and hand, grimacing and abnormal facial movements.  She was treated with Keppra.  Patient had side effects with Keppra and this was changed to carbamazepine which also exacerbate his symptoms.  She stopped taking antiseizure medications.  Of note patient has been on Xanax since 1992.  This is been used for anxiety and IBS.  Since she had seizure disorder she was managed with these benzodiazepines to help with seizure control.  Now patient on combination of Xanax and Ativan.  Sometimes she misses a dose of Xanax and usually has a breakthrough seizure.  This has been happening every 3 to 4 weeks.   REVIEW OF SYSTEMS: Full 14 system review of systems performed and negative with exception  of: as per HPI.  ALLERGIES: Allergies  Allergen Reactions   Copper    Copper-Containing Compounds    Shellfish Allergy    Latex Rash    Rubber cleaning gloves used to clean home. But less reactive in recent years Rubber cleaning gloves used to clean home. But less reactive in recent years   Nickel Rash   Soap Rash    Detergent    HOME MEDICATIONS: Outpatient Medications Prior to Visit  Medication Sig Dispense Refill   ALPRAZolam (XANAX) 0.5 MG tablet Take 1 tablet po QHS prn 30 tablet 2   Ascorbic Acid (VITAMIN C PO) Take by mouth.     aspirin 81 MG chewable tablet Chew by mouth daily.     atenolol (TENORMIN) 25 MG tablet Take 1 to 2 tablets po QD for HTN 180 tablet 3   lamoTRIgine (LAMICTAL) 100 MG tablet Take 1 tablet (100 mg total) by mouth 2 (two) times daily. 60 tablet 5   LORazepam (ATIVAN) 1 MG tablet Take 1 tablet po QAM. Mmay take 1 tablet QPM as needed 60 tablet 2   dicyclomine (BENTYL) 10 MG capsule Take 1 capsule (10 mg total) by mouth 3 (three) times daily as needed for spasms. (Patient not taking: Reported on 05/21/2021) 90 capsule 2   lidocaine (LIDODERM) 5 % Apply externally to effected areas. Remove & Discard patch within 12 hours or as directed by MD 30 patch 0   No facility-administered medications prior to visit.    PAST MEDICAL HISTORY: Past Medical History:  Diagnosis Date  Hypertension    Seizures ([place])    last seizure 05/14/21    PAST SURGICAL HISTORY: Past Surgical History:  Procedure Laterality Date   CHOLECYSTECTOMY      FAMILY HISTORY: Family History  Problem Relation Age of Onset   Cancer Sister    Cancer Brother     SOCIAL HISTORY: Social History   Socioeconomic History   Marital status: Married    Spouse name: Monica Collins   Number of children: 2   Years of education: Not on file   Highest education level: Not on file  Occupational History   Not on file  Tobacco Use   Smoking status: Some Days    Packs/day: 1.00    Years:  50.00    Pack years: 50.00    Types: Cigarettes    Last attempt to quit: 10/21/2016    Years since quitting: 4.5   Smokeless tobacco: Never  Vaping Use   Vaping Use: Some days  Substance and Sexual Activity   Alcohol use: No    Comment: once a year   Drug use: Never   Sexual activity: Not Currently    Birth control/protection: Post-menopausal  Other Topics Concern   Not on file  Social History Narrative   Lives with husband    Social Determinants of Health   Financial Resource Strain: Not on file  Food Insecurity: Not on file  Transportation Needs: Not on file  Physical Activity: Not on file  Stress: Not on file  Social Connections: Not on file  Intimate Partner Violence: Not on file     PHYSICAL EXAM  GENERAL EXAM/CONSTITUTIONAL: Vitals:  Vitals:   05/21/21 1409  BP: (!) 150/66  Pulse: (!) 55  Weight: 112 lb 9.6 oz (51.1 kg)  Height: 5' (1.524 m)   Body mass index is 21.99 kg/m. Wt Readings from Last 3 Encounters:  05/21/21 112 lb 9.6 oz (51.1 kg)  03/13/21 110 lb 3.2 oz (50 kg)  02/06/21 109 lb 6.4 oz (49.6 kg)   Patient is in no distress; well developed, nourished and groomed; neck is supple  CARDIOVASCULAR: Examination of carotid arteries is normal; no carotid bruits Regular rate and rhythm, no murmurs Examination of peripheral vascular system by observation and palpation is normal  EYES: Ophthalmoscopic exam of optic discs and posterior segments is normal; no papilledema or hemorrhages No results found.  MUSCULOSKELETAL: Gait, strength, tone, movements noted in Neurologic exam below  NEUROLOGIC: MENTAL STATUS:  MMSE - Mini Mental State Exam 03/21/2020 02/16/2019 02/12/2018  Orientation to time 5 5 4   Orientation to Place 5 5 5   Registration 3 3 3   Attention/ Calculation 5 5 5   Recall 3 3 3   Language- name 2 objects 2 2 2   Language- repeat 1 1 1   Language- follow 3 step command 3 3 3   Language- read & follow direction 1 1 1   Write a  sentence 1 1 1   Copy design 1 1 1   Total score 30 30 29    awake, alert, oriented to person, place and time recent and remote memory intact normal attention and concentration language fluent, comprehension intact, naming intact fund of knowledge appropriate  CRANIAL NERVE:  2nd - no papilledema on fundoscopic exam 2nd, 3rd, 4th, 6th - pupils equal and reactive to light, visual fields full to confrontation, extraocular muscles intact, no nystagmus 5th - facial sensation symmetric 7th - facial strength symmetric 8th - hearing intact 9th - palate elevates symmetrically, uvula midline 11th - shoulder shrug symmetric 12th -  tongue protrusion midline  MOTOR:  normal bulk and tone, full strength in the BUE, BLE  SENSORY:  normal and symmetric to light touch, temperature, vibration  COORDINATION:  finger-nose-finger, fine finger movements normal  REFLEXES:  deep tendon reflexes present and symmetric  GAIT/STATION:  narrow based gait     DIAGNOSTIC DATA (LABS, IMAGING, TESTING) - I reviewed patient records, labs, notes, testing and imaging myself where available.  Lab Results  Component Value Date   WBC 7.4 05/13/2017   HGB 14.5 05/13/2017   HCT 42.0 05/13/2017   MCV 92.7 05/13/2017   PLT 194 05/13/2017      Component Value Date/Time   NA 139 05/13/2017 1951   K 4.3 05/13/2017 1951   CL 102 05/13/2017 1951   CO2 28 05/13/2017 1951   GLUCOSE 97 05/13/2017 1951   BUN 11 05/13/2017 1951   CREATININE 0.77 05/13/2017 1951   CALCIUM 9.3 05/13/2017 1951   PROT 7.1 05/13/2017 1951   ALBUMIN 4.3 05/13/2017 1951   AST 33 05/13/2017 1951   ALT 26 05/13/2017 1951   ALKPHOS 85 05/13/2017 1951   BILITOT 0.8 05/13/2017 1951   GFRNONAA >60 05/13/2017 1951   GFRAA >60 05/13/2017 1951   No results found for: CHOL, HDL, LDLCALC, LDLDIRECT, TRIG, CHOLHDL No results found for: HGBA1C No results found for: VITAMINB12 No results found for: TSH   11/09/17 MRI brain / MRA head   - Sequela of prior right MCA aneurysm clip ligation. No residual or new aneurysm  identified. - Right frontotemporal and insular encephalomalacia and gliosis. Asymmetric atrophy of  the right hippocampal complex and amygdala.    ASSESSMENT AND PLAN  76 y.o. year old female here with:  Dx:  1. Seizure disorder ([place])   2. Chronic ischemic right MCA stroke       PLAN:  RIGHT MCA STROKE --> COMPLEX PARTIAL SEIZURES (R frontal and R temporal lobe localization; last seizure ~05/12/21) - continue lamotrigine titration for seizure control (restarting 05/13/21) - continue benzodiazepines for now (for anxiety / IBS)  Meds ordered this encounter  Medications   lamoTRIgine (LAMICTAL) 25 MG tablet    Sig: Take 25mg  daily for 2 weeks; then take 25mg  twice a day for 2 weeks; then take 50mg  twice a day for 2 weeks; then take 75mg  twice a day for 2 weeks; then 100mg  twice a day    Dispense:  240 tablet    Refill:  3   Return in about 6 months (around 11/18/2021).  I reviewed images, labs, notes, records myself. I summarized findings and reviewed with patient, for this high risk condition (seizures) requiring high complexity decision making.    Penni Bombard, MD 6/64/4034, 7:42 PM Certified in Neurology, Neurophysiology and Neuroimaging  [facility] [address], [place], [place] [address] [phone]

## 2021-05-31 ENCOUNTER — Other Ambulatory Visit: Payer: Self-pay | Admitting: Nurse Practitioner

## 2021-05-31 DIAGNOSIS — F17209 Nicotine dependence, unspecified, with unspecified nicotine-induced disorders: Secondary | ICD-10-CM

## 2021-05-31 MED ORDER — VARENICLINE TARTRATE 0.5 MG PO TABS: ORAL_TABLET | ORAL | 0 refills | Status: DC

## 2021-05-31 NOTE — Progress Notes (Signed)
Prescription for starting month of chantix sent to [facility]. Will send continuing month prescription after successful completion of starting month.

## 2021-06-28 ENCOUNTER — Other Ambulatory Visit: Payer: Self-pay | Admitting: Nurse Practitioner

## 2021-06-28 DIAGNOSIS — F411 Generalized anxiety disorder: Secondary | ICD-10-CM

## 2021-06-28 DIAGNOSIS — G40909 Epilepsy, unspecified, not intractable, without status epilepticus: Secondary | ICD-10-CM

## 2021-06-28 MED ORDER — ALPRAZOLAM 0.5 MG PO TABS: ORAL_TABLET | ORAL | 1 refills | Status: DC

## 2021-06-28 MED ORDER — LORAZEPAM 1 MG PO TABS: ORAL_TABLET | ORAL | 1 refills | Status: DC

## 2021-08-02 ENCOUNTER — Encounter: Payer: Self-pay | Admitting: Nurse Practitioner

## 2021-08-02 ENCOUNTER — Ambulatory Visit (INDEPENDENT_AMBULATORY_CARE_PROVIDER_SITE_OTHER): Payer: Medicare HMO | Admitting: Nurse Practitioner

## 2021-08-02 VITALS — BP 125/70 | HR 67 | Temp 98.1°F | Ht 60.0 in | Wt 110.0 lb

## 2021-08-02 DIAGNOSIS — R109 Unspecified abdominal pain: Secondary | ICD-10-CM

## 2021-08-02 DIAGNOSIS — Z1231 Encounter for screening mammogram for malignant neoplasm of breast: Secondary | ICD-10-CM | POA: Diagnosis not present

## 2021-08-02 DIAGNOSIS — F411 Generalized anxiety disorder: Secondary | ICD-10-CM

## 2021-08-02 DIAGNOSIS — D485 Neoplasm of uncertain behavior of skin: Secondary | ICD-10-CM

## 2021-08-02 DIAGNOSIS — G40909 Epilepsy, unspecified, not intractable, without status epilepticus: Secondary | ICD-10-CM

## 2021-08-02 DIAGNOSIS — I10 Essential (primary) hypertension: Secondary | ICD-10-CM | POA: Diagnosis not present

## 2021-08-02 DIAGNOSIS — R635 Abnormal weight gain: Secondary | ICD-10-CM | POA: Diagnosis not present

## 2021-08-02 DIAGNOSIS — B001 Herpesviral vesicular dermatitis: Secondary | ICD-10-CM

## 2021-08-02 DIAGNOSIS — C3432 Malignant neoplasm of lower lobe, left bronchus or lung: Secondary | ICD-10-CM

## 2021-08-02 MED ORDER — VALACYCLOVIR HCL 500 MG PO TABS: ORAL_TABLET | ORAL | 3 refills | Status: DC

## 2021-08-02 MED ORDER — BUPROPION HCL 75 MG PO TABS: ORAL_TABLET | ORAL | 2 refills | Status: DC

## 2021-08-02 MED ORDER — LORAZEPAM 1 MG PO TABS: ORAL_TABLET | ORAL | 2 refills | Status: DC

## 2021-08-02 MED ORDER — ALPRAZOLAM 0.5 MG PO TABS: ORAL_TABLET | ORAL | 2 refills | Status: DC

## 2021-08-02 NOTE — Progress Notes (Signed)
Established patient visit ? ? ?Patient: Monica Collins   DOB: July 02, 1945   76 y.o. Female  MRN: [id number] ?Visit Date: 08/02/2021 ? ? ?Chief Complaint  ?Patient presents with  ? Bloated  ? ?Subjective  ?  ?HPI  ?The patient is here for follow up visit.  ?-fever blister. Has been getting them since she was a little girl. Used to take valtrex at onset of fever blister which would "take care of it" before it ever became a fever blister. The current fever blister has been present for 2 t o3 days  ?-has a place on right side of her face which is dry and scaly. She states that it is itchy sometimes. She is concerned because her husband had simlar appearing lesion and was diagnosed with skin cancer. She states that this lesion ohas been present for several months.  ?Did trial of chantix to help her stop smoking. She states that it helped initially. But chantix made her feel very sad and crying all the time.  ?-concern for weight gain, only in the stomach. Feels like she is pregnant.  ?-she denies chest pain, chest pressure, or shortness of breath. She denies headaches or visual disturbances. She denies abdominal pain, nausea, vomiting, or changes in bowel or bladder habits.   ? ?Medications: ?Outpatient Medications Prior to Visit  ?Medication Sig  ? Ascorbic Acid (VITAMIN C PO) Take by mouth.  ? aspirin 81 MG chewable tablet Chew by mouth daily.  ? atenolol (TENORMIN) 25 MG tablet Take 1 to 2 tablets po QD for HTN  ? lamoTRIgine (LAMICTAL) 100 MG tablet Take 1 tablet (100 mg total) by mouth 2 (two) times daily.  ? lamoTRIgine (LAMICTAL) 25 MG tablet Take 56m daily for 2 weeks; then take 219mtwice a day for 2 weeks; then take 5086mwice a day for 2 weeks; then take 22m73mice a day for 2 weeks; then 100mg91mce a day  ? [DISCONTINUED] ALPRAZolam (XANAX) 0.5 MG tablet Take 1 tablet po QHS prn  ? [DISCONTINUED] LORazepam (ATIVAN) 1 MG tablet Take 1 tablet po QAM. Mmay take 1 tablet QPM as needed  ? dicyclomine (BENTYL) 10  MG capsule Take 1 capsule (10 mg total) by mouth 3 (three) times daily as needed for spasms. (Patient not taking: Reported on 05/21/2021)  ? [DISCONTINUED] varenicline (CHANTIX) 0.5 MG tablet Take 1 tablet po QD for 3 days, then take 1 tablet po BID for 4 days, then take 2 tablets po QAM and 1 tablet po QPM for 5 days, then take 2 tablets po BID. Take with food.  ? ?No facility-administered medications prior to visit.  ? ? ?Review of Systems  ?Constitutional:  Positive for fatigue. Negative for activity change, appetite change, chills and fever.  ?HENT:  Negative for congestion, postnasal drip, rhinorrhea, sinus pressure, sinus pain, sneezing and sore throat.   ?Eyes: Negative.   ?Respiratory:  Negative for cough, chest tightness, shortness of breath and wheezing.   ?Cardiovascular:  Negative for chest pain and palpitations.  ?Gastrointestinal:  Positive for abdominal distention. Negative for abdominal pain, constipation, diarrhea, nausea and vomiting.  ?Endocrine: Negative for cold intolerance, heat intolerance, polydipsia and polyuria.  ?Genitourinary:  Negative for dyspareunia, dysuria, flank pain, frequency and urgency.  ?Musculoskeletal:  Negative for arthralgias, back pain and myalgias.  ?Skin:  Negative for rash.  ?     Small area on right side of the face which is white in color, dry, and rough in texture. Can be removed  and comes right back.   ?Allergic/Immunologic: Negative for environmental allergies.  ?Neurological:  Positive for seizures. Negative for dizziness, weakness and headaches.  ?Hematological:  Negative for adenopathy.  ?Psychiatric/Behavioral:  Positive for dysphoric mood. The patient is nervous/anxious.   ? ? ? ? Objective  ?  ? ?Today's Vitals  ? 08/02/21 1434  ?BP: 125/70  ?Pulse: 67  ?Temp: 98.1 ?F (36.7 ?C)  ?SpO2: 96%  ?Weight: 110 lb (49.9 kg)  ?Height: 5' (1.524 m)  ? ?Body mass index is 21.48 kg/m?.  ? ?BP Readings from Last 3 Encounters:  ?08/02/21 125/70  ?05/21/21 (!) 150/66   ?03/13/21 137/72  ?  ?Wt Readings from Last 3 Encounters:  ?08/02/21 110 lb (49.9 kg)  ?05/21/21 112 lb 9.6 oz (51.1 kg)  ?03/13/21 110 lb 3.2 oz (50 kg)  ?  ?Physical Exam ?Vitals and nursing note reviewed.  ?Constitutional:   ?   Appearance: Normal appearance. She is well-developed.  ?HENT:  ?   Head: Normocephalic and atraumatic.  ? ?   Nose: Nose normal.  ?   Mouth/Throat:  ?   Mouth: Mucous membranes are moist.  ?   Pharynx: Oropharynx is clear.  ?Eyes:  ?   Pupils: Pupils are equal, round, and reactive to light.  ?Cardiovascular:  ?   Rate and Rhythm: Normal rate and regular rhythm.  ?   Pulses: Normal pulses.  ?   Heart sounds: Normal heart sounds.  ?Pulmonary:  ?   Effort: Pulmonary effort is normal.  ?   Breath sounds: Normal breath sounds.  ?Abdominal:  ?   General: Bowel sounds are normal. There is distension.  ?   Palpations: Abdomen is soft. There is no mass.  ?   Tenderness: There is no abdominal tenderness. There is no right CVA tenderness, left CVA tenderness, guarding or rebound.  ?   Hernia: No hernia is present.  ?Musculoskeletal:     ?   General: Normal range of motion.  ?   Cervical back: Normal range of motion and neck supple.  ?Lymphadenopathy:  ?   Cervical: No cervical adenopathy.  ?Skin: ?   General: Skin is warm and dry.  ?   Capillary Refill: Capillary refill takes less than 2 seconds.  ?Neurological:  ?   General: No focal deficit present.  ?   Mental Status: She is alert and oriented to person, place, and time. Mental status is at baseline.  ?Psychiatric:     ?   Attention and Perception: Attention and perception normal.     ?   Mood and Affect: Mood is anxious and depressed. Affect is tearful.     ?   Speech: Speech normal.     ?   Behavior: Behavior normal. Behavior is cooperative.     ?   Thought Content: Thought content normal.     ?   Cognition and Memory: Cognition and memory normal.     ?   Judgment: Judgment normal.  ?  ? ? ?Results for orders placed or performed in visit on  08/02/21  ?Hemoglobin A1c  ?Result Value Ref Range  ? Hgb A1c MFr Bld 5.5 4.8 - 5.6 %  ? Est. average glucose Bld gHb Est-mCnc 111 mg/dL  ?T4, free  ?Result Value Ref Range  ? Free T4 1.32 0.82 - 1.77 ng/dL  ?TSH  ?Result Value Ref Range  ? TSH 2.310 0.450 - 4.500 uIU/mL  ?CBC with Differential/Platelet  ?Result Value Ref Range  ?  WBC 7.6 3.4 - 10.8 x10E3/uL  ? RBC 4.62 3.77 - 5.28 x10E6/uL  ? Hemoglobin 14.6 11.1 - 15.9 g/dL  ? Hematocrit 42.3 34.0 - 46.6 %  ? MCV 92 79 - 97 fL  ? MCH 31.6 26.6 - 33.0 pg  ? MCHC 34.5 31.5 - 35.7 g/dL  ? RDW 12.5 11.7 - 15.4 %  ? Platelets 171 150 - 450 x10E3/uL  ? Neutrophils 62 Not Estab. %  ? Lymphs 25 Not Estab. %  ? Monocytes 9 Not Estab. %  ? Eos 4 Not Estab. %  ? Basos 0 Not Estab. %  ? Neutrophils Absolute 4.7 1.4 - 7.0 x10E3/uL  ? Lymphocytes Absolute 1.9 0.7 - 3.1 x10E3/uL  ? Monocytes Absolute 0.7 0.1 - 0.9 x10E3/uL  ? EOS (ABSOLUTE) 0.3 0.0 - 0.4 x10E3/uL  ? Basophils Absolute 0.0 0.0 - 0.2 x10E3/uL  ? Immature Granulocytes 0 Not Estab. %  ? Immature Grans (Abs) 0.0 0.0 - 0.1 x10E3/uL  ?Comp Met (CMET)  ?Result Value Ref Range  ? Glucose 79 70 - 99 mg/dL  ? BUN 10 8 - 27 mg/dL  ? Creatinine, Ser 0.88 0.57 - 1.00 mg/dL  ? eGFR 69 >59 mL/min/1.73  ? BUN/Creatinine Ratio 11 (L) 12 - 28  ? Sodium 140 134 - 144 mmol/L  ? Potassium 4.2 3.5 - 5.2 mmol/L  ? Chloride 100 96 - 106 mmol/L  ? CO2 25 20 - 29 mmol/L  ? Calcium 9.5 8.7 - 10.3 mg/dL  ? Total Protein 6.8 6.0 - 8.5 g/dL  ? Albumin 4.4 3.7 - 4.7 g/dL  ? Globulin, Total 2.4 1.5 - 4.5 g/dL  ? Albumin/Globulin Ratio 1.8 1.2 - 2.2  ? Bilirubin Total 0.6 0.0 - 1.2 mg/dL  ? Alkaline Phosphatase 78 44 - 121 IU/L  ? AST 19 0 - 40 IU/L  ? ALT 12 0 - 32 IU/L  ? ? Assessment & Plan  ?  ?1. Essential hypertension ?Stable. Conitnue atenolol as prescribed  ? ?2. Seizure disorder ([place]) ?May take lorazepam 100m - once in the morning and once at night as needed to prevent seizures. Continue regular visits with neurology as scheduled  ?-  LORazepam (ATIVAN) 1 MG tablet; Take 1 tablet po QAM. Mmay take 1 tablet QPM as needed  Dispense: 60 tablet; Refill: 2 ? ?3. 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

## 2021-08-03 LAB — COMPREHENSIVE METABOLIC PANEL
ALT: 12 IU/L (ref 0–32)
AST: 19 IU/L (ref 0–40)
Albumin/Globulin Ratio: 1.8 (ref 1.2–2.2)
Albumin: 4.4 g/dL (ref 3.7–4.7)
Alkaline Phosphatase: 78 IU/L (ref 44–121)
BUN/Creatinine Ratio: 11 — ABNORMAL LOW (ref 12–28)
BUN: 10 mg/dL (ref 8–27)
Bilirubin Total: 0.6 mg/dL (ref 0.0–1.2)
CO2: 25 mmol/L (ref 20–29)
Calcium: 9.5 mg/dL (ref 8.7–10.3)
Chloride: 100 mmol/L (ref 96–106)
Creatinine, Ser: 0.88 mg/dL (ref 0.57–1.00)
Globulin, Total: 2.4 g/dL (ref 1.5–4.5)
Glucose: 79 mg/dL (ref 70–99)
Potassium: 4.2 mmol/L (ref 3.5–5.2)
Sodium: 140 mmol/L (ref 134–144)
Total Protein: 6.8 g/dL (ref 6.0–8.5)
eGFR: 69 mL/min/{1.73_m2} (ref >59)

## 2021-08-03 LAB — CBC WITH DIFFERENTIAL/PLATELET
Basophils Absolute: 0 10*3/uL (ref 0.0–0.2)
Basos: 0 %
EOS (ABSOLUTE): 0.3 10*3/uL (ref 0.0–0.4)
Eos: 4 %
Hematocrit: 42.3 % (ref 34.0–46.6)
Hemoglobin: 14.6 g/dL (ref 11.1–15.9)
Immature Grans (Abs): 0 10*3/uL (ref 0.0–0.1)
Immature Granulocytes: 0 %
Lymphocytes Absolute: 1.9 10*3/uL (ref 0.7–3.1)
Lymphs: 25 %
MCH: 31.6 pg (ref 26.6–33.0)
MCHC: 34.5 g/dL (ref 31.5–35.7)
MCV: 92 fL (ref 79–97)
Monocytes Absolute: 0.7 10*3/uL (ref 0.1–0.9)
Monocytes: 9 %
Neutrophils Absolute: 4.7 10*3/uL (ref 1.4–7.0)
Neutrophils: 62 %
Platelets: 171 10*3/uL (ref 150–450)
RBC: 4.62 x10E6/uL (ref 3.77–5.28)
RDW: 12.5 % (ref 11.7–15.4)
WBC: 7.6 10*3/uL (ref 3.4–10.8)

## 2021-08-03 LAB — T4, FREE: Free T4: 1.32 ng/dL (ref 0.82–1.77)

## 2021-08-03 LAB — HEMOGLOBIN A1C
Est. average glucose Bld gHb Est-mCnc: 111 mg/dL
Hgb A1c MFr Bld: 5.5 % (ref 4.8–5.6)

## 2021-08-03 LAB — TSH: TSH: 2.31 u[IU]/mL (ref 0.450–4.500)

## 2021-08-05 NOTE — Progress Notes (Signed)
Please let the patient know that labs drawn during her visit looked excellent.  ?Thanks so much.   -HB

## 2021-08-06 DIAGNOSIS — R635 Abnormal weight gain: Secondary | ICD-10-CM | POA: Insufficient documentation

## 2021-08-06 DIAGNOSIS — D485 Neoplasm of uncertain behavior of skin: Secondary | ICD-10-CM | POA: Insufficient documentation

## 2021-08-06 DIAGNOSIS — R109 Unspecified abdominal pain: Secondary | ICD-10-CM | POA: Insufficient documentation

## 2021-08-06 DIAGNOSIS — B001 Herpesviral vesicular dermatitis: Secondary | ICD-10-CM | POA: Insufficient documentation

## 2021-08-19 ENCOUNTER — Other Ambulatory Visit: Payer: Self-pay | Admitting: Nurse Practitioner

## 2021-08-19 DIAGNOSIS — R300 Dysuria: Secondary | ICD-10-CM

## 2021-08-19 DIAGNOSIS — N390 Urinary tract infection, site not specified: Secondary | ICD-10-CM

## 2021-08-19 MED ORDER — PHENAZOPYRIDINE HCL 100 MG PO TABS: 100.0000 mg | ORAL_TABLET | Freq: Three times a day (TID) | ORAL | 0 refills | Status: DC | PRN

## 2021-08-19 MED ORDER — CEPHALEXIN 500 MG PO CAPS: 500.0000 mg | ORAL_CAPSULE | Freq: Three times a day (TID) | ORAL | 0 refills | Status: DC

## 2021-08-19 NOTE — Progress Notes (Signed)
Sent keflex 250mg  three times daily for 7 days and pyridium 100mg  which may be taken up to three times daily as needed for bladder pain and spasms.  ?

## 2021-08-20 DIAGNOSIS — Z1231 Encounter for screening mammogram for malignant neoplasm of breast: Secondary | ICD-10-CM | POA: Diagnosis not present

## 2021-08-23 ENCOUNTER — Other Ambulatory Visit: Payer: Self-pay | Admitting: Nurse Practitioner

## 2021-08-23 DIAGNOSIS — F411 Generalized anxiety disorder: Secondary | ICD-10-CM

## 2021-08-23 DIAGNOSIS — G40909 Epilepsy, unspecified, not intractable, without status epilepticus: Secondary | ICD-10-CM

## 2021-08-23 MED ORDER — ALPRAZOLAM 0.5 MG PO TABS: ORAL_TABLET | ORAL | 2 refills | Status: DC

## 2021-08-23 MED ORDER — LORAZEPAM 1 MG PO TABS: ORAL_TABLET | ORAL | 2 refills | Status: DC

## 2021-08-23 NOTE — Progress Notes (Signed)
Resent prescriptions for alprazolam and lorazepam to [facility] ?

## 2021-08-24 ENCOUNTER — Other Ambulatory Visit: Payer: Self-pay | Admitting: Diagnostic Neuroimaging

## 2021-08-24 ENCOUNTER — Other Ambulatory Visit: Payer: Self-pay | Admitting: *Deleted

## 2021-08-24 DIAGNOSIS — G40909 Epilepsy, unspecified, not intractable, without status epilepticus: Secondary | ICD-10-CM

## 2021-08-24 MED ORDER — LAMOTRIGINE 100 MG PO TABS: 100.0000 mg | ORAL_TABLET | Freq: Two times a day (BID) | ORAL | 5 refills | Status: DC

## 2021-09-04 ENCOUNTER — Telehealth: Payer: Self-pay | Admitting: Diagnostic Neuroimaging

## 2021-09-04 NOTE — Telephone Encounter (Signed)
Informed pt of time change for 7/25 appt- MD in meeting. ?

## 2021-10-16 ENCOUNTER — Telehealth: Payer: Self-pay | Admitting: Nurse Practitioner

## 2021-10-16 ENCOUNTER — Other Ambulatory Visit: Payer: Self-pay | Admitting: Nurse Practitioner

## 2021-10-16 DIAGNOSIS — D485 Neoplasm of uncertain behavior of skin: Secondary | ICD-10-CM

## 2021-10-16 NOTE — Telephone Encounter (Signed)
Please let the patient know that I made a new referral placed to [facility] in [place]. I made referral as urgent. Patient was initially referred to [facility] dermatology back in April. Per referral note, they were unable to contact her. Lesion on side of face getting larger. Thanks so much.   -HB

## 2021-10-16 NOTE — Telephone Encounter (Signed)
Called pt LVM to contact the office

## 2021-10-16 NOTE — Telephone Encounter (Signed)
The referral that was placed for her to go to Dermatology was closed by someone and she is requesting another one be sent. Please advise.

## 2021-10-16 NOTE — Progress Notes (Signed)
New referral placed to [facility] in [place]. I made referral as urgent. Patient was initially referred to [facility] dermatology back in April. Per referral note, they were unable to contact her. Lesion on side of face getting larger.

## 2021-10-17 NOTE — Telephone Encounter (Signed)
Called pt she is advised of the referral that was placed

## 2021-10-19 ENCOUNTER — Encounter (HOSPITAL_BASED_OUTPATIENT_CLINIC_OR_DEPARTMENT_OTHER): Payer: Self-pay

## 2021-11-01 ENCOUNTER — Ambulatory Visit (INDEPENDENT_AMBULATORY_CARE_PROVIDER_SITE_OTHER): Payer: Medicare HMO | Admitting: Nurse Practitioner

## 2021-11-01 DIAGNOSIS — Z91199 Patient's noncompliance with other medical treatment and regimen due to unspecified reason: Secondary | ICD-10-CM

## 2021-11-01 NOTE — Progress Notes (Deleted)
Established patient visit   Patient: Monica Collins   DOB: [birth year]   76 y.o. Female  MRN: [id number] Visit Date: 11/01/2021   No chief complaint on file.  Subjective    HPI  Follow up visit -seizure disorder  -taking lorazepam 1 mg twice daily as needed.  -now seeing neurology. Now on lamictal per neurology. As of last visit, had been seizure free for some time.  -generalized anxiety disorder.  --have been gradually weaning off dose alprazolam. She is now taking only one 0.5 mg tablet in the evening when needed.  --PDMP profile reviewed today. Her overdose risk score is 120. There are no red flags or state indicators. Her fill history for lorazepam and alprazolam is appropriate.  --she does have a contorlled substances agreement signed and is up to date in her chart . -hypertension  --blood pressure has been historically well managed on atenolol 25 mg.    Medications: Outpatient Medications Prior to Visit  Medication Sig   ALPRAZolam (XANAX) 0.5 MG tablet Take 1 tablet po QHS prn   Ascorbic Acid (VITAMIN C PO) Take by mouth.   aspirin 81 MG chewable tablet Chew by mouth daily.   atenolol (TENORMIN) 25 MG tablet Take 1 to 2 tablets po QD for HTN   buPROPion (WELLBUTRIN) 75 MG tablet Take 1 tablet po qam for one week. May increase dose to twice daily as tolerated   cephALEXin (KEFLEX) 500 MG capsule Take 1 capsule (500 mg total) by mouth 3 (three) times daily.   dicyclomine (BENTYL) 10 MG capsule Take 1 capsule (10 mg total) by mouth 3 (three) times daily as needed for spasms. (Patient not taking: Reported on 05/21/2021)   lamoTRIgine (LAMICTAL) 100 MG tablet Take 1 tablet (100 mg total) by mouth 2 (two) times daily.   LORazepam (ATIVAN) 1 MG tablet Take 1 tablet po QAM. Mmay take 1 tablet QPM as needed   phenazopyridine (PYRIDIUM) 100 MG tablet Take 1 tablet (100 mg total) by mouth 3 (three) times daily as needed for pain.   valACYclovir (VALTREX) 500 MG tablet Take 1 tablet po  bid for 7 days for fever blister   No facility-administered medications prior to visit.    Review of Systems  {Labs (Optional):23779}   Objective    There were no vitals taken for this visit. BP Readings from Last 3 Encounters:  08/02/21 125/70  05/21/21 (!) 150/66  03/13/21 137/72    Wt Readings from Last 3 Encounters:  08/02/21 110 lb (49.9 kg)  05/21/21 112 lb 9.6 oz (51.1 kg)  03/13/21 110 lb 3.2 oz (50 kg)    Physical Exam  ***  No results found for any visits on 11/01/21.  Assessment & Plan     Problem List Items Addressed This Visit   None    No follow-ups on file.         Ronnell Freshwater, NP  [facility] at [facility] [phone] (phone) [phone] (fax)  [place]

## 2021-11-14 NOTE — Progress Notes (Signed)
Established patient visit   Patient: Monica Collins   DOB: [birth year]   76 y.o. Female  MRN: [id number] Visit Date: 11/15/2021   Chief Complaint  Patient presents with   Follow-up   Subjective    HPI  Follow up visit -hypertension  -seizure disorder - has new patient appointment with neurology coming up in few days. Doing well on Lamotrigine.  -generalized anxiety.  --reviewed PDMP  ---overdose risk score 110 with no red flags or state indicators  ---most recent fills for both alprazolam and clorazepam 10/18/2021.  -last visit, screening mammogram and CT chest ordered for lung cancer follow up -labs done at last visit --lab results were essentially normal.   Did have mammogram done at [place]. Results are unavailable. Will request them.   Itchy rash on both legs, palms of hands, chest, and scalp. Has noted patchy and scaly skin. Has had this in the past. Has not bothered her for some time.  Was referred to dermatology and missed initial appointment. Next initial appointment is scheduled for 12/13/2021.     Medications: Outpatient Medications Prior to Visit  Medication Sig   Ascorbic Acid (VITAMIN C PO) Take by mouth.   aspirin 81 MG chewable tablet Chew by mouth daily.   atenolol (TENORMIN) 25 MG tablet Take 1 to 2 tablets po QD for HTN   buPROPion (WELLBUTRIN) 75 MG tablet Take 1 tablet po qam for one week. May increase dose to twice daily as tolerated   valACYclovir (VALTREX) 500 MG tablet Take 1 tablet po bid for 7 days for fever blister   [DISCONTINUED] ALPRAZolam (XANAX) 0.5 MG tablet Take 1 tablet po QHS prn   [DISCONTINUED] cephALEXin (KEFLEX) 500 MG capsule Take 1 capsule (500 mg total) by mouth 3 (three) times daily.   [DISCONTINUED] lamoTRIgine (LAMICTAL) 100 MG tablet Take 1 tablet (100 mg total) by mouth 2 (two) times daily.   [DISCONTINUED] LORazepam (ATIVAN) 1 MG tablet Take 1 tablet po QAM. Mmay take 1 tablet QPM as needed   [DISCONTINUED]  phenazopyridine (PYRIDIUM) 100 MG tablet Take 1 tablet (100 mg total) by mouth 3 (three) times daily as needed for pain.   dicyclomine (BENTYL) 10 MG capsule Take 1 capsule (10 mg total) by mouth 3 (three) times daily as needed for spasms.   No facility-administered medications prior to visit.    Review of Systems  Constitutional:  Negative for activity change, appetite change, chills, fatigue and fever.  HENT:  Negative for congestion, postnasal drip, rhinorrhea, sinus pressure, sinus pain, sneezing and sore throat.   Eyes: Negative.   Respiratory:  Negative for cough, chest tightness, shortness of breath and wheezing.   Cardiovascular:  Negative for chest pain and palpitations.  Gastrointestinal:  Negative for abdominal pain, constipation, diarrhea, nausea and vomiting.  Endocrine: Negative for cold intolerance, heat intolerance, polydipsia and polyuria.  Genitourinary:  Negative for dyspareunia, dysuria, flank pain, frequency and urgency.  Musculoskeletal:  Negative for arthralgias, back pain and myalgias.  Skin:  Negative for rash.       Small area on right side of the face which is white in color, dry, and rough in texture. Can be removed and comes right back.  Now having generalized itching. No rash present.   Allergic/Immunologic: Negative for environmental allergies.  Neurological:  Positive for weakness and headaches. Negative for dizziness.  Hematological:  Negative for adenopathy.  Psychiatric/Behavioral:  Positive for dysphoric mood. The patient is nervous/anxious.     Last CBC Lab Results  Component Value Date   WBC 7.6 08/02/2021   HGB 14.6 08/02/2021   HCT 42.3 08/02/2021   MCV 92 08/02/2021   MCH 31.6 08/02/2021   RDW 12.5 08/02/2021   PLT 171 38/45/3646   Last metabolic panel Lab Results  Component Value Date   GLUCOSE 79 08/02/2021   NA 140 08/02/2021   K 4.2 08/02/2021   CL 100 08/02/2021   CO2 25 08/02/2021   BUN 10 08/02/2021   CREATININE 0.88  08/02/2021   EGFR 69 08/02/2021   CALCIUM 9.5 08/02/2021   PROT 6.8 08/02/2021   ALBUMIN 4.4 08/02/2021   LABGLOB 2.4 08/02/2021   AGRATIO 1.8 08/02/2021   BILITOT 0.6 08/02/2021   ALKPHOS 78 08/02/2021   AST 19 08/02/2021   ALT 12 08/02/2021   ANIONGAP 9 05/13/2017    Lab Results  Component Value Date   HGBA1C 5.5 08/02/2021   Last thyroid functions Lab Results  Component Value Date   TSH 2.310 08/02/2021        Objective     Today's Vitals   11/15/21 1516 11/15/21 1612  BP: (!) 143/68 135/69  Pulse: 67   SpO2: 96%   Weight: 108 lb 12.8 oz (49.4 kg)   Height: 4' 11.84" (1.52 m)    Body mass index is 21.36 kg/m.   BP Readings from Last 3 Encounters:  11/20/21 125/61  11/15/21 135/69  08/02/21 125/70    Wt Readings from Last 3 Encounters:  11/20/21 106 lb 9.6 oz (48.4 kg)  11/15/21 108 lb 12.8 oz (49.4 kg)  08/02/21 110 lb (49.9 kg)    Physical Exam Vitals and nursing note reviewed.  Constitutional:      Appearance: Normal appearance. She is well-developed.  HENT:     Head: Normocephalic and atraumatic.      Comments:      Nose: Nose normal.     Mouth/Throat:     Mouth: Mucous membranes are moist.     Pharynx: Oropharynx is clear.  Eyes:     Extraocular Movements: Extraocular movements intact.     Conjunctiva/sclera: Conjunctivae normal.     Pupils: Pupils are equal, round, and reactive to light.  Cardiovascular:     Rate and Rhythm: Normal rate and regular rhythm.     Pulses: Normal pulses.     Heart sounds: Normal heart sounds.  Pulmonary:     Effort: Pulmonary effort is normal.     Breath sounds: Normal breath sounds.  Abdominal:     General: Bowel sounds are normal. There is distension.     Palpations: Abdomen is soft. There is no mass.     Tenderness: There is no abdominal tenderness. There is no right CVA tenderness, left CVA tenderness, guarding or rebound.     Hernia: No hernia is present.  Musculoskeletal:        General: Normal  range of motion.     Cervical back: Normal range of motion and neck supple.  Lymphadenopathy:     Cervical: No cervical adenopathy.  Skin:    General: Skin is warm and dry.     Capillary Refill: Capillary refill takes less than 2 seconds.     Comments: Generalized urticaria. No evidence off rash or discreet lesions noted.   Neurological:     General: No focal deficit present.     Mental Status: She is alert and oriented to person, place, and time. Mental status is at baseline.  Psychiatric:  Attention and Perception: Attention and perception normal.        Mood and Affect: Mood normal. Affect is tearful.        Speech: Speech normal.        Behavior: Behavior normal. Behavior is cooperative.        Thought Content: Thought content normal.        Cognition and Memory: Cognition and memory normal.        Judgment: Judgment normal.       Assessment & Plan    1. Essential hypertension Stable. Continue bp medication as prescribed.   2. Atopic dermatitis, unspecified type Given solu-medrol 40 mg injection in office. Tolerated this well. May apply clobetasol cream to all effected areas twice daily as needed.  - clobetasol cream (TEMOVATE) 0.05 %; Apply 1 Application topically 2 (two) times daily.  Dispense: 60 g; Refill: 3 - methylPREDNISolone sodium succinate (SOLU-MEDROL) 40 mg/mL injection 40 mg  3. Anxiety, generalized May take lorazepam 1 mg  up to twice daily as needed for acute anxiety. Alprazolam 0.5 mg may be taken at bedtime as needed. New prescriptions for both  were sent to her pharmacy today.  - ALPRAZolam (XANAX) 0.5 MG tablet; Take 1 tablet po QHS prn  Dispense: 30 tablet; Refill: 2 - LORazepam (ATIVAN) 1 MG tablet; Take 1 tablet po QAM. Mmay take 1 tablet QPM as needed  Dispense: 60 tablet; Refill: 2  4. Seizure disorder ([place]) Improved. May take lorazepam up to  twice daily as needed. She should see neurologist as scheduled.  - LORazepam (ATIVAN) 1 MG tablet;  Take 1 tablet po QAM. Mmay take 1 tablet QPM as needed  Dispense: 60 tablet; Refill: 2   Problem List Items Addressed This Visit       Cardiovascular and Mediastinum   Essential hypertension - Primary     Nervous and Auditory   Seizure disorder ([facility])   Relevant Medications   LORazepam (ATIVAN) 1 MG tablet     Musculoskeletal and Integument   Atopic dermatitis   Relevant Medications   clobetasol cream (TEMOVATE) 0.05 %   methylPREDNISolone sodium succinate (SOLU-MEDROL) 40 mg/mL injection 40 mg     Other   Anxiety, generalized   Relevant Medications   ALPRAZolam (XANAX) 0.5 MG tablet   LORazepam (ATIVAN) 1 MG tablet     Return in about 3 months (around 02/15/2022) for medicare wellness.         Ronnell Freshwater, NP  [facility] at [facility] [phone] (phone) [phone] (fax)  [place]

## 2021-11-15 ENCOUNTER — Ambulatory Visit (INDEPENDENT_AMBULATORY_CARE_PROVIDER_SITE_OTHER): Payer: Medicare HMO | Admitting: Nurse Practitioner

## 2021-11-15 ENCOUNTER — Encounter: Payer: Self-pay | Admitting: Nurse Practitioner

## 2021-11-15 VITALS — BP 135/69 | HR 67 | Ht 59.84 in | Wt 108.8 lb

## 2021-11-15 DIAGNOSIS — G40909 Epilepsy, unspecified, not intractable, without status epilepticus: Secondary | ICD-10-CM

## 2021-11-15 DIAGNOSIS — F411 Generalized anxiety disorder: Secondary | ICD-10-CM | POA: Diagnosis not present

## 2021-11-15 DIAGNOSIS — L209 Atopic dermatitis, unspecified: Secondary | ICD-10-CM | POA: Diagnosis not present

## 2021-11-15 DIAGNOSIS — I10 Essential (primary) hypertension: Secondary | ICD-10-CM

## 2021-11-15 MED ORDER — CLOBETASOL PROPIONATE 0.05 % EX CREA: 1.0000 | TOPICAL_CREAM | Freq: Two times a day (BID) | CUTANEOUS | 3 refills | Status: DC

## 2021-11-15 MED ORDER — ALPRAZOLAM 0.5 MG PO TABS: ORAL_TABLET | ORAL | 2 refills | Status: DC

## 2021-11-15 MED ORDER — LORAZEPAM 1 MG PO TABS: ORAL_TABLET | ORAL | 2 refills | Status: DC

## 2021-11-15 MED ORDER — METHYLPREDNISOLONE SODIUM SUCC 40 MG IJ SOLR
40.0000 mg | Freq: Once | INTRAMUSCULAR | Status: DC
  Administered 2021-11-15: 40 mg via INTRAMUSCULAR

## 2021-11-15 MED ORDER — METHYLPREDNISOLONE SODIUM SUCC 40 MG IJ SOLR: 40.0000 mg | Freq: Once | INTRAMUSCULAR | Status: DC

## 2021-11-20 ENCOUNTER — Encounter: Payer: Self-pay | Admitting: Diagnostic Neuroimaging

## 2021-11-20 ENCOUNTER — Ambulatory Visit: Payer: Medicare HMO | Admitting: Diagnostic Neuroimaging

## 2021-11-20 DIAGNOSIS — G40909 Epilepsy, unspecified, not intractable, without status epilepticus: Secondary | ICD-10-CM | POA: Diagnosis not present

## 2021-11-20 MED ORDER — LAMOTRIGINE 100 MG PO TABS: 100.0000 mg | ORAL_TABLET | Freq: Two times a day (BID) | ORAL | 4 refills | Status: DC

## 2021-11-20 NOTE — Progress Notes (Signed)
[facility]  PATIENT: Monica Collins DOB: Sep 11, 1945  REFERRING CLINICIAN: Ronnell Freshwater, NP HISTORY FROM: patient and husband REASON FOR VISIT: follow up   HISTORICAL  CHIEF COMPLAINT:  Chief Complaint  Patient presents with   Seizures    Rm 7, 6 month FU, husband- Jeani Hawking, "no more episodes since being on medicine"     HISTORY OF PRESENT ILLNESS:   UPDATE (11/20/21, VRP): Since last visit, doing well. Symptoms are stable. No seizures. Tolerating meds.  UPDATE (05/21/21, VRP): Since last visit, tried lamotrigine for a while, then stopped b/c husband thought sz were worsening. But was tapering off benzos at that time also. Last sz ~05/12/21; now restarted lamotrigine after that sz.  PRIOR HPI (11/14/20): 76 year old female here for evaluation of seizures.  2018 patient was being treated for stage II lung cancer and had screening MRI of the brain.  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  She underwent craniotomy and aneurysm clipping on 10/23/2016.  Patient had a postoperative stroke affecting right MCA region.  Patient then had clinical and electrographic seizures in the hospital.  Patient had confusion, staring spell, abnormal movements of her left arm and hand, grimacing and abnormal facial movements.  She was treated with Keppra.  Patient had side effects with Keppra and this was changed to carbamazepine which also exacerbate his symptoms.  She stopped taking antiseizure medications.  Of note patient has been on Xanax since 1992.  This is been used for anxiety and IBS.  Since she had seizure disorder she was managed with these benzodiazepines to help with seizure control.  Now patient on combination of Xanax and Ativan.  Sometimes she misses a dose of Xanax and usually has a breakthrough seizure.  This has been happening every 3 to 4 weeks.   REVIEW OF SYSTEMS: Full 14 system review of systems performed and negative with exception of: as per  HPI.  ALLERGIES: Allergies  Allergen Reactions   Copper    Copper-Containing Compounds    Shellfish Allergy    Latex Rash    Rubber cleaning gloves used to clean home. But less reactive in recent years Rubber cleaning gloves used to clean home. But less reactive in recent years   Nickel Rash   Soap Rash    Detergent    HOME MEDICATIONS: Outpatient Medications Prior to Visit  Medication Sig Dispense Refill   ALPRAZolam (XANAX) 0.5 MG tablet Take 1 tablet po QHS prn 30 tablet 2   Ascorbic Acid (VITAMIN C PO) Take by mouth.     aspirin 81 MG chewable tablet Chew by mouth daily.     atenolol (TENORMIN) 25 MG tablet Take 1 to 2 tablets po QD for HTN 180 tablet 3   buPROPion (WELLBUTRIN) 75 MG tablet Take 1 tablet po qam for one week. May increase dose to twice daily as tolerated 60 tablet 2   clobetasol cream (TEMOVATE) 2.68 % Apply 1 Application topically 2 (two) times daily. 60 g 3   dicyclomine (BENTYL) 10 MG capsule Take 1 capsule (10 mg total) by mouth 3 (three) times daily as needed for spasms. 90 capsule 2   LORazepam (ATIVAN) 1 MG tablet Take 1 tablet po QAM. Mmay take 1 tablet QPM as needed 60 tablet 2   valACYclovir (VALTREX) 500 MG tablet Take 1 tablet po bid for 7 days for fever blister 14 tablet 3   lamoTRIgine (LAMICTAL) 100 MG tablet Take 1 tablet (100 mg total) by  mouth 2 (two) times daily. 60 tablet 5   Facility-Administered Medications Prior to Visit  Medication Dose Route Frequency Provider Last Rate Last Admin   methylPREDNISolone sodium succinate (SOLU-MEDROL) 40 mg/mL injection 40 mg  40 mg Intramuscular Once Ronnell Freshwater, NP        PAST MEDICAL HISTORY: Past Medical History:  Diagnosis Date   Aneurysm ([place]) 10/2017   brain, [facility]   Eczema    Hypertension    Lung cancer ([place])    Seizures ([place])    last seizure 05/14/21   Stroke [facility])     PAST SURGICAL HISTORY: Past Surgical History:  Procedure Laterality Date   CHOLECYSTECTOMY     LUNG CANCER  SURGERY  2019    FAMILY HISTORY: Family History  Problem Relation Age of Onset   Cancer Sister    Cancer Brother     SOCIAL HISTORY: Social History   Socioeconomic History   Marital status: Married    Spouse name: Lynwood   Number of children: 2   Years of education: Not on file   Highest education level: Not on file  Occupational History   Not on file  Tobacco Use   Smoking status: Some Days    Packs/day: 1.00    Years: 50.00    Total pack years: 50.00    Types: Cigarettes    Last attempt to quit: 10/21/2016    Years since quitting: 5.0   Smokeless tobacco: Never   Tobacco comments:    11/20/21 pipe tobacco, 1/2 ppd  Vaping Use   Vaping Use: Some days  Substance and Sexual Activity   Alcohol use: No    Comment: once a year   Drug use: Never   Sexual activity: Not Currently    Birth control/protection: Post-menopausal  Other Topics Concern   Not on file  Social History Narrative   Lives with husband    Social Determinants of Health   Financial Resource Strain: Not on file  Food Insecurity: Not on file  Transportation Needs: Not on file  Physical Activity: Not on file  Stress: Not on file  Social Connections: Not on file  Intimate Partner Violence: Not on file     PHYSICAL EXAM  GENERAL EXAM/CONSTITUTIONAL: Vitals:  Vitals:   11/20/21 1350  BP: 125/61  Pulse: (!) 58  Weight: 106 lb 9.6 oz (48.4 kg)  Height: 4\' 11"  (1.499 m)   Body mass index is 21.53 kg/m. Wt Readings from Last 3 Encounters:  11/20/21 106 lb 9.6 oz (48.4 kg)  11/15/21 108 lb 12.8 oz (49.4 kg)  08/02/21 110 lb (49.9 kg)   Patient is in no distress; well developed, nourished and groomed; neck is supple  CARDIOVASCULAR: Examination of carotid arteries is normal; no carotid bruits Regular rate and rhythm, no murmurs Examination of peripheral vascular system by observation and palpation is normal  EYES: Ophthalmoscopic exam of optic discs and posterior segments is normal; no  papilledema or hemorrhages No results found.  MUSCULOSKELETAL: Gait, strength, tone, movements noted in Neurologic exam below  NEUROLOGIC: MENTAL STATUS:     03/21/2020    3:50 PM 02/16/2019    2:17 PM 02/12/2018   12:09 PM  MMSE - Mini Mental State Exam  Orientation to time 5 5 4   Orientation to Place 5 5 5   Registration 3 3 3   Attention/ Calculation 5 5 5   Recall 3 3 3   Language- name 2 objects 2 2 2   Language- repeat 1 1 1   Language-  follow 3 step command 3 3 3   Language- read & follow direction 1 1 1   Write a sentence 1 1 1   Copy design 1 1 1   Total score 30 30 29    awake, alert, oriented to person, place and time recent and remote memory intact normal attention and concentration language fluent, comprehension intact, naming intact fund of knowledge appropriate  CRANIAL NERVE:  2nd - no papilledema on fundoscopic exam 2nd, 3rd, 4th, 6th - pupils equal and reactive to light, visual fields full to confrontation, extraocular muscles intact, no nystagmus 5th - facial sensation symmetric 7th - facial strength symmetric 8th - hearing intact 9th - palate elevates symmetrically, uvula midline 11th - shoulder shrug symmetric 12th - tongue protrusion midline  MOTOR:  normal bulk and tone, full strength in the BUE, BLE  SENSORY:  normal and symmetric to light touch, temperature, vibration  COORDINATION:  finger-nose-finger, fine finger movements normal  REFLEXES:  deep tendon reflexes present and symmetric  GAIT/STATION:  narrow based gait     DIAGNOSTIC DATA (LABS, IMAGING, TESTING) - I reviewed patient records, labs, notes, testing and imaging myself where available.  Lab Results  Component Value Date   WBC 7.6 08/02/2021   HGB 14.6 08/02/2021   HCT 42.3 08/02/2021   MCV 92 08/02/2021   PLT 171 08/02/2021      Component Value Date/Time   NA 140 08/02/2021 1528   K 4.2 08/02/2021 1528   CL 100 08/02/2021 1528   CO2 25 08/02/2021 1528   GLUCOSE  79 08/02/2021 1528   GLUCOSE 97 05/13/2017 1951   BUN 10 08/02/2021 1528   CREATININE 0.88 08/02/2021 1528   CALCIUM 9.5 08/02/2021 1528   PROT 6.8 08/02/2021 1528   ALBUMIN 4.4 08/02/2021 1528   AST 19 08/02/2021 1528   ALT 12 08/02/2021 1528   ALKPHOS 78 08/02/2021 1528   BILITOT 0.6 08/02/2021 1528   GFRNONAA >60 05/13/2017 1951   GFRAA >60 05/13/2017 1951   No results found for: "CHOL", "HDL", "LDLCALC", "LDLDIRECT", "TRIG", "CHOLHDL" Lab Results  Component Value Date   HGBA1C 5.5 08/02/2021   No results found for: "VITAMINB12" Lab Results  Component Value Date   TSH 2.310 08/02/2021     11/09/17 MRI brain / MRA head  - [place]ela of prior right MCA aneurysm clip ligation. No residual or new aneurysm  identified. - Right frontotemporal and insular encephalomalacia and gliosis. Asymmetric atrophy of  the right hippocampal complex and amygdala.    ASSESSMENT AND PLAN  76 y.o. year old female here with:  Dx:  1. Seizure disorder ([place])        PLAN:  RIGHT MCA STROKE --> COMPLEX PARTIAL SEIZURES (R frontal and R temporal lobe localization; last seizure ~05/12/21) - continue lamotrigine 100mg  twice a day for seizure control (restarted 05/13/21) - continue benzodiazepines for now (for anxiety / IBS)  Meds ordered this encounter  Medications   lamoTRIgine (LAMICTAL) 100 MG tablet    Sig: Take 1 tablet (100 mg total) by mouth 2 (two) times daily.    Dispense:  180 tablet    Refill:  4   Return in about 1 year (around 11/21/2022) for MyChart visit (15 min).    Penni Bombard, MD 0/35/4656, 8:12 PM Certified in Neurology, Neurophysiology and Neuroimaging  [facility] [address], [place], [place] [address] [phone]

## 2021-12-13 DIAGNOSIS — L408 Other psoriasis: Secondary | ICD-10-CM | POA: Diagnosis not present

## 2021-12-13 DIAGNOSIS — L570 Actinic keratosis: Secondary | ICD-10-CM | POA: Diagnosis not present

## 2021-12-13 DIAGNOSIS — L308 Other specified dermatitis: Secondary | ICD-10-CM | POA: Diagnosis not present

## 2021-12-24 ENCOUNTER — Other Ambulatory Visit: Payer: Self-pay | Admitting: Nurse Practitioner

## 2021-12-24 ENCOUNTER — Telehealth: Payer: Self-pay | Admitting: Nurse Practitioner

## 2021-12-24 DIAGNOSIS — G40909 Epilepsy, unspecified, not intractable, without status epilepticus: Secondary | ICD-10-CM

## 2021-12-24 DIAGNOSIS — F411 Generalized anxiety disorder: Secondary | ICD-10-CM

## 2021-12-24 MED ORDER — LORAZEPAM 0.5 MG PO TABS: ORAL_TABLET | ORAL | 2 refills | Status: DC

## 2021-12-24 NOTE — Telephone Encounter (Signed)
I took care of this and sent to her pharmacy. She is aware.

## 2021-12-24 NOTE — Progress Notes (Signed)
Had to change dosing lorazepam to 0.5 mg tablets, taking two tablets BID prn. New RX for #120 with 2 refills sent to [address] in [place] due to pharmacy stock.

## 2021-12-24 NOTE — Telephone Encounter (Signed)
Patient is currently taking Xanax 0.5 mg po qd however the pharmacy cannot get that strength in and doesn't have an estimate of when they are suggesting a lower dosage taking twice daily. Please advise. [phone]

## 2022-01-13 ENCOUNTER — Other Ambulatory Visit: Payer: Self-pay | Admitting: Nurse Practitioner

## 2022-01-13 DIAGNOSIS — I10 Essential (primary) hypertension: Secondary | ICD-10-CM

## 2022-01-31 DIAGNOSIS — L2089 Other atopic dermatitis: Secondary | ICD-10-CM | POA: Diagnosis not present

## 2022-01-31 DIAGNOSIS — L570 Actinic keratosis: Secondary | ICD-10-CM | POA: Diagnosis not present

## 2022-01-31 DIAGNOSIS — L408 Other psoriasis: Secondary | ICD-10-CM | POA: Diagnosis not present

## 2022-02-04 NOTE — Progress Notes (Signed)
Patient was a no show for this appointment

## 2022-02-10 ENCOUNTER — Other Ambulatory Visit: Payer: Self-pay | Admitting: Nurse Practitioner

## 2022-02-10 DIAGNOSIS — G40909 Epilepsy, unspecified, not intractable, without status epilepticus: Secondary | ICD-10-CM

## 2022-02-10 DIAGNOSIS — F411 Generalized anxiety disorder: Secondary | ICD-10-CM

## 2022-02-10 MED ORDER — ALPRAZOLAM 0.5 MG PO TABS: ORAL_TABLET | ORAL | 1 refills | Status: DC

## 2022-02-20 ENCOUNTER — Other Ambulatory Visit: Payer: Self-pay | Admitting: Nurse Practitioner

## 2022-02-26 ENCOUNTER — Ambulatory Visit: Payer: Medicare HMO | Admitting: Nurse Practitioner

## 2022-03-19 ENCOUNTER — Other Ambulatory Visit: Payer: Self-pay | Admitting: Nurse Practitioner

## 2022-03-19 DIAGNOSIS — F411 Generalized anxiety disorder: Secondary | ICD-10-CM

## 2022-03-19 DIAGNOSIS — G40909 Epilepsy, unspecified, not intractable, without status epilepticus: Secondary | ICD-10-CM

## 2022-03-19 MED ORDER — LORAZEPAM 1 MG PO TABS: ORAL_TABLET | ORAL | 1 refills | Status: DC

## 2022-03-20 ENCOUNTER — Ambulatory Visit (INDEPENDENT_AMBULATORY_CARE_PROVIDER_SITE_OTHER): Payer: Medicare HMO | Admitting: Nurse Practitioner

## 2022-03-20 ENCOUNTER — Encounter: Payer: Self-pay | Admitting: Nurse Practitioner

## 2022-03-20 VITALS — BP 135/80 | HR 73 | Ht 59.0 in | Wt 112.4 lb

## 2022-03-20 DIAGNOSIS — I10 Essential (primary) hypertension: Secondary | ICD-10-CM | POA: Diagnosis not present

## 2022-03-20 DIAGNOSIS — D485 Neoplasm of uncertain behavior of skin: Secondary | ICD-10-CM | POA: Diagnosis not present

## 2022-03-20 DIAGNOSIS — Z85118 Personal history of other malignant neoplasm of bronchus and lung: Secondary | ICD-10-CM | POA: Diagnosis not present

## 2022-03-20 DIAGNOSIS — F411 Generalized anxiety disorder: Secondary | ICD-10-CM

## 2022-03-20 DIAGNOSIS — Z0000 Encounter for general adult medical examination without abnormal findings: Secondary | ICD-10-CM | POA: Diagnosis not present

## 2022-03-20 DIAGNOSIS — F17200 Nicotine dependence, unspecified, uncomplicated: Secondary | ICD-10-CM | POA: Diagnosis not present

## 2022-03-20 DIAGNOSIS — Z122 Encounter for screening for malignant neoplasm of respiratory organs: Secondary | ICD-10-CM | POA: Diagnosis not present

## 2022-03-20 DIAGNOSIS — G40909 Epilepsy, unspecified, not intractable, without status epilepticus: Secondary | ICD-10-CM

## 2022-03-20 DIAGNOSIS — L209 Atopic dermatitis, unspecified: Secondary | ICD-10-CM

## 2022-03-20 NOTE — Progress Notes (Signed)
Subjective:   Monica Collins is a 76 y.o. female who presents for Medicare Annual (Subsequent) preventive examination. -elevated blood pressure.  -due to have lung cancer screening -she is current, everyday smoker -she is currently taking wellbutrin 75 mg daily. Prescription written for bid.  -this has helped her reduce her smoking, but she has been unable to quit thus far.  -would like to have referral to [facility] dermatology  --dry, rough skin  --was not happy with care she received from prior dermatology provider.  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  --alprazolam taken at bedtime  --lorazepam taken twice daily when needed   Review of Systems    Review of Systems  Constitutional:  Positive for malaise/fatigue. Negative for chills and fever.  HENT:  Negative for congestion, sinus pain and sore throat.   Eyes: Negative.   Respiratory:  Negative for cough, shortness of breath and wheezing.   Cardiovascular:  Negative for chest pain, palpitations and leg swelling.       Elevated blood pressure    Gastrointestinal:  Negative for constipation, diarrhea, nausea and vomiting.  Genitourinary: Negative.   Musculoskeletal:  Negative for myalgias.  Skin:        Skin lesions on face, shoulders, and back.  Would like new referral to dermatology  for further evaluation and treatment   Neurological:  Positive for seizures. Negative for dizziness and headaches.  Endo/Heme/Allergies:  Does not bruise/bleed easily.  Psychiatric/Behavioral:  Positive for depression. The patient is nervous/anxious.           Objective:    Today's Vitals   03/20/22 1334 03/20/22 1450  BP: (Abnormal) 156/75 135/80  Pulse: 73   SpO2: 95%   Weight: 112 lb 6.4 oz (51 kg)   Height: 4\' 11"  (1.499 m)    Body mass index is 22.7 kg/m.    Row Labels 12/02/2016    2:54 PM 11/25/2016    3:25 PM 11/21/2016    8:30 AM 11/20/2016    1:08 PM  Advanced Directives   Section Header. No data  exists in this row.      Does Patient Have a Medical Advance Directive?   No No No No  Would patient like information on creating a medical advance directive?      Yes (MAU/Ambulatory/Procedural Areas - Information given)    Current Medications (verified) Outpatient Encounter Medications as of 03/20/2022  Medication Sig   ALPRAZolam (XANAX) 0.5 MG tablet Take 1 tablet po QHS prn   Ascorbic Acid (VITAMIN C PO) Take by mouth.   aspirin 81 MG chewable tablet Chew by mouth daily.   atenolol (TENORMIN) 25 MG tablet TAKE 1 TO 2 TABLETS BY MOUTH DAILY FOR HTN   buPROPion (WELLBUTRIN) 75 MG tablet Take 1 tablet po qam for one week. May increase dose to twice daily as tolerated   clobetasol (TEMOVATE) 0.05 % external solution Apply topically 2 (two) times daily.   dicyclomine (BENTYL) 10 MG capsule Take 1 capsule (10 mg total) by mouth 3 (three) times daily as needed for spasms.   ketoconazole (NIZORAL) 2 % shampoo Apply topically 3 (three) times a week.   lamoTRIgine (LAMICTAL) 100 MG tablet Take 1 tablet (100 mg total) by mouth 2 (two) times daily.   LORazepam (ATIVAN) 1 MG tablet TAKE 1 TABLET BY MOUTH IN THE MORNING AND MAY TAKE 1 IN THE EVENING AS NEEDED   valACYclovir (VALTREX) 500 MG tablet Take 1 tablet po bid for 7  days for fever blister   clobetasol cream (TEMOVATE) 5.85 % Apply 1 Application topically 2 (two) times daily. 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, [facility]   Eczema    Hypertension    Lung cancer ([place])    Seizures ([place])    last seizure 05/14/21   Stroke [facility])    Past Surgical History:  Procedure Laterality Date    CHOLECYSTECTOMY     LUNG CANCER SURGERY  2019   Family History  Problem Relation Age of Onset   Cancer Sister    Cancer Brother    Social History   Socioeconomic History   Marital status: Married    Spouse name: Lynwood   Number of children: 2   Years of education: Not on file   Highest education level: Not on file  Occupational History   Not on file  Tobacco Use   Smoking status: Some Days    Packs/day: 1.00    Years: 50.00    Total pack years: 50.00    Types: Cigarettes    Last attempt to quit: 10/21/2016    Years since quitting: 5.4   Smokeless tobacco: Never   Tobacco comments:    11/20/21 pipe tobacco, 1/2 ppd  Vaping Use   Vaping Use: Some days  Substance and Sexual Activity   Alcohol use: No    Comment: once a year   Drug use: Never   Sexual activity: Not Currently    Birth control/protection: Post-menopausal  Other Topics Concern   Not on file  Social History Narrative   Lives with husband    Social Determinants of Health   Financial Resource Strain: Medium Risk (03/20/2022)   Overall Financial Resource Strain (CARDIA)    Difficulty of Paying Living Expenses: Somewhat hard  Food Insecurity: No Food Insecurity (03/20/2022)   Hunger Vital Sign    Worried About Running Out of Food in the Last Year: Never true    Ran Out of Food in the Last Year: Never true  Transportation Needs: No Transportation Needs (03/20/2022)   PRAPARE - Hydrologist (Medical): No    Lack of Transportation (Non-Medical): No  Physical Activity: Insufficiently Active (03/20/2022)   Exercise Vital Sign    Days of Exercise per Week: 3 days    Minutes of Exercise per Session: 30 min  Stress: Stress Concern Present (03/20/2022)   [place]    Feeling of Stress : To some extent  Social Connections: Moderately Isolated (03/20/2022)   Social Connection and Isolation Panel [NHANES]    Frequency  of Communication with Friends and Family: More than three times a week    Frequency of Social Gatherings with Friends and Family: More than three times a week    Attends Religious Services: Never    Marine scientist or Organizations: No    Attends Archivist Meetings: Never    Marital Status: Married  Physical Exam Vitals and nursing note reviewed.  Constitutional:      Appearance: Normal appearance. She is well-developed.  HENT:     Head: Normocephalic and atraumatic.     Right Ear: Tympanic membrane, ear  canal and external ear normal.     Left Ear: Tympanic membrane, ear canal and external ear normal.     Nose: Nose normal.     Mouth/Throat:     Mouth: Mucous membranes are moist.     Pharynx: Oropharynx is clear.  Eyes:     Extraocular Movements: Extraocular movements intact.     Conjunctiva/sclera: Conjunctivae normal.     Pupils: Pupils are equal, round, and reactive to light.  Neck:     Vascular: No carotid bruit.  Cardiovascular:     Rate and Rhythm: Normal rate and regular rhythm.     Pulses: Normal pulses.     Heart sounds: Normal heart sounds.  Pulmonary:     Effort: Pulmonary effort is normal.     Breath sounds: Normal breath sounds.  Abdominal:     General: Bowel sounds are normal. There is no distension.     Palpations: Abdomen is soft. There is no mass.     Tenderness: There is no abdominal tenderness. There is no right CVA tenderness, left CVA tenderness, guarding or rebound.     Hernia: No hernia is present.  Musculoskeletal:        General: Normal range of motion.     Cervical back: Normal range of motion and neck supple.  Lymphadenopathy:     Cervical: No cervical adenopathy.  Skin:    General: Skin is warm and dry.     Capillary Refill: Capillary refill takes less than 2 seconds.  Neurological:     General: No focal deficit present.     Mental Status: She is alert and oriented to person, place, and time. Mental status is at baseline.   Psychiatric:        Attention and Perception: Attention and perception normal.        Mood and Affect: Affect normal. Mood is anxious.        Speech: Speech normal.        Behavior: Behavior normal. Behavior is cooperative.        Thought Content: Thought content normal.        Cognition and Memory: Cognition and memory normal.        Judgment: Judgment normal.      Tobacco Counseling Ready to quit: Not Answered Counseling given: Not Answered Tobacco comments: 11/20/21 pipe tobacco, 1/2 ppd   Clinical Intake:  Pre-visit preparation completed: Yes  Pain : No/denies pain     BMI - recorded: 22.7 Nutritional Status: BMI of 19-24  Normal Nutritional Risks: None Diabetes: No  How often do you need to have someone help you when you read instructions, pamphlets, or other written materials from your doctor or pharmacy?: 1 - Never  Diabetic?no  Interpreter Needed?: No      Activities of Daily Living   Row Labels 03/20/2022    1:40 PM 11/15/2021    3:20 PM  In your present state of health, do you have any difficulty performing the following activities:   Section Header. No data exists in this row.    Hearing?   0 1  Vision?   0 1  Difficulty concentrating or making decisions?   1 1  Walking or climbing stairs?   0 0  Dressing or bathing?   0 0  Doing errands, shopping?   0 1    Patient Care Team: Ronnell Freshwater, NP as PCP - General (Family Medicine)  Indicate any recent Medical Services you may have received from other  than [facility] providers in the past year (date may be approximate).     Assessment:   1. Encounter for Medicare annual wellness exam Annual medicare wellness visit today   2. Essential hypertension Stable. Continue atenolol as previously prescribed   3. Seizure disorder ([place]) Continue lamictal as before. Use lorazepam and alprazolam as previously prescribed.   4. Anxiety, generalized Use lorazepam and alprazolam as previously prescribed   5.  Current every day smoker CT lung cancer screening ordered today  - CT CHEST LUNG CANCER SCREENING LOW DOSE WO CONTRAST; Future  6. History of lung cancer CT lung cancer screening ordered today  - CT CHEST LUNG CANCER SCREENING LOW DOSE WO CONTRAST; Future  7. Encounter for screening for lung cancer CT lung cancer screening ordered today  - CT CHEST LUNG CANCER SCREENING LOW DOSE WO CONTRAST; Future  8. Atopic dermatitis, unspecified type New referral to dermatology today  - Ambulatory referral to Dermatology  9. Neoplasm of uncertain behavior of skin of face New referral to dermatology today  - Ambulatory referral to Dermatology   Hearing/Vision screen No results found.  Depression Screen   Row Labels 03/20/2022    1:39 PM 11/15/2021    3:18 PM 08/02/2021    2:37 PM 03/13/2021   11:28 AM 02/06/2021   11:43 AM 01/03/2021    2:37 PM 10/03/2020    2:39 PM  PHQ 2/9 Scores   Section Header. No data exists in this row.         PHQ - 2 Score   2 4 2 1 2 1 3   PHQ- 9 Score   6 14 9 2 7 2 12     Fall Risk   Row Labels 03/20/2022    1:40 PM 03/13/2021   11:28 AM 02/06/2021   11:42 AM 10/03/2020    2:39 PM 08/01/2020    4:34 PM  Fall Risk    Section Header. No data exists in this row.       Falls in the past year?   0 0 0 0 0  Number falls in past yr:   0 0 1 0   Injury with Fall?   0 0 1 0   Risk for fall due to :     History of fall(s);Impaired balance/gait;Impaired mobility    Follow up   Falls evaluation completed Falls evaluation completed Falls evaluation completed Falls evaluation completed Falls evaluation completed    FALL RISK PREVENTION PERTAINING TO THE HOME:  Any stairs in or around the home? Yes  If so, are there any without handrails? Yes  Home free of loose throw rugs in walkways, pet beds, electrical cords, etc? Yes  Adequate lighting in your home to reduce risk of falls? Yes   ASSISTIVE DEVICES UTILIZED TO PREVENT FALLS:  Life alert? No  Use of a cane, walker  or w/c? No  Grab bars in the bathroom? Yes  Shower chair or bench in shower? Yes  Elevated toilet seat or a handicapped toilet? No   TIMED UP AND GO:  Was the test performed? Yes .  Length of time to ambulate 10 feet: 10 sec.   Gait steady and fast without use of assistive device  Cognitive Function:   Row Labels 03/21/2020    3:50 PM 02/16/2019    2:17 PM 02/12/2018   12:09 PM  MMSE - Mini Mental State Exam   Section Header. No data exists in this row.     Orientation to time  5 5 4   Orientation to Place   5 5 5   Registration   3 3 3   Attention/ Calculation   5 5 5   Recall   3 3 3   Language- name 2 objects   2 2 2   Language- repeat   1 1 1   Language- follow 3 step command   3 3 3   Language- read & follow direction   1 1 1   Write a sentence   1 1 1   Copy design   1 1 1   Total score   30 30 29        Row Labels 03/20/2022    1:40 PM 01/03/2021    2:38 PM  6CIT Screen   Section Header. No data exists in this row.    What Year?    0 points  What month?    0 points  What time?   0 points 0 points  Count back from 20   0 points 0 points  Months in reverse   0 points 0 points  Repeat phrase   2 points 0 points  Total Score    0 points    Immunizations Immunization History  Administered Date(s) Administered   Influenza-Unspecified 02/27/2018   PFIZER(Purple Top)SARS-COV-2 Vaccination 06/18/2019, 07/09/2019, 03/13/2020   Tdap 10/11/2015   Zoster, Live 05/28/2012    TDAP status: Up to date  Flu Vaccine status: Declined, Education has been provided regarding the importance of this vaccine but patient still declined. Advised may receive this vaccine at local pharmacy or Health Dept. Aware to provide a copy of the vaccination record if obtained from local pharmacy or Health Dept. Verbalized acceptance and understanding.  Pneumococcal vaccine status: Due, Education has been provided regarding the importance of this vaccine. Advised may receive this vaccine at local  pharmacy or Health Dept. Aware to provide a copy of the vaccination record if obtained from local pharmacy or Health Dept. Verbalized acceptance and understanding.  Covid-19 vaccine status: Completed vaccines  Qualifies for Shingles Vaccine? Yes   Zostavax completed No   Shingrix Completed?: No.    Education has been provided regarding the importance of this vaccine. Patient has been advised to call insurance company to determine out of pocket expense if they have not yet received this vaccine. Advised may also receive vaccine at local pharmacy or Health Dept. Verbalized acceptance and understanding.  Screening Tests Health Maintenance  Topic Date Due   COVID-19 Vaccine (4 - 2023-24 season) 12/28/2021   Zoster Vaccines- Shingrix (1 of 2) 06/20/2022 (Originally 11/07/1964)   INFLUENZA VACCINE  07/28/2022 (Originally 11/27/2021)   Pneumonia Vaccine 42+ Years old (1 - PCV) 03/21/2023 (Originally 11/06/1951)   Medicare Annual Wellness (AWV)  03/21/2023   MAMMOGRAM  08/17/2023   Fecal DNA (Cologuard)  01/18/2024   DTaP/Tdap/Td (2 - Td or Tdap) 10/10/2025   DEXA SCAN  Completed   Hepatitis C Screening  Completed   HPV VACCINES  Aged Out   COLONOSCOPY (Pts 45-74yrs Insurance coverage will need to be confirmed)  Discontinued    Health Maintenance  Health Maintenance Due  Topic Date Due   COVID-19 Vaccine (4 - 2023-24 season) 12/28/2021    Colorectal cancer screening: Type of screening: Cologuard. Completed 2022. Repeat every 3 years  Mammogram status: Completed 07/2021. Repeat every year  Bone Density status: Completed 2012. Results reflect: Bone density results: OSTEOPENIA. Repeat every 3   years.  Lung Cancer Screening: (Low Dose CT Chest recommended if Age 29-80 years, 30 pack-year currently  smoking OR have quit w/in 15years.) does qualify.   Lung Cancer Screening Referral: has been placed  Additional Screening:  Hepatitis C Screening: does qualify; Completed 2021  Vision  Screening: Recommended annual ophthalmology exams for early detection of glaucoma and other disorders of the eye. Is the patient up to date with their annual eye exam?  No  Who is the provider or what is the name of the office in which the patient attends annual eye exams? Patient declined If pt is not established with a provider, would they like to be referred to a provider to establish care? No .   Dental Screening: Recommended annual dental exams for proper oral hygiene  Community Resource Referral / Chronic Care Management: CRR required this visit?  No   CCM required this visit?  No      Plan:     I have personally reviewed and noted the following in the patient's chart:   Medical and social history Use of alcohol, tobacco or illicit drugs  Current medications and supplements including opioid prescriptions. Patient is not currently taking opioid prescriptions. Functional ability and status Nutritional status Physical activity Advanced directives List of other physicians Hospitalizations, surgeries, and ER visits in previous 12 months Vitals Screenings to include cognitive, depression, and falls Referrals and appointments  In addition, I have reviewed and discussed with patient certain preventive protocols, quality metrics, and best practice recommendations. A written personalized care plan for preventive services as well as general preventive health recommendations were provided to patient.  I connected with  Therisa Doyne on 03/31/22 by an audio only telemedicine application and verified that I am speaking with the correct person using two identifiers.   I  Ronnell Freshwater, NP   03/31/2022   Nurse Notes: face to face 25 min

## 2022-04-10 ENCOUNTER — Other Ambulatory Visit: Payer: Self-pay | Admitting: Nurse Practitioner

## 2022-04-10 DIAGNOSIS — F411 Generalized anxiety disorder: Secondary | ICD-10-CM

## 2022-04-10 MED ORDER — ALPRAZOLAM 0.5 MG PO TABS: ORAL_TABLET | ORAL | 2 refills | Status: DC

## 2022-04-18 ENCOUNTER — Other Ambulatory Visit: Payer: Self-pay | Admitting: Nurse Practitioner

## 2022-04-18 DIAGNOSIS — F411 Generalized anxiety disorder: Secondary | ICD-10-CM

## 2022-04-18 DIAGNOSIS — G40909 Epilepsy, unspecified, not intractable, without status epilepticus: Secondary | ICD-10-CM

## 2022-04-18 MED ORDER — LORAZEPAM 1 MG PO TABS: ORAL_TABLET | ORAL | 2 refills | Status: DC

## 2022-05-03 DIAGNOSIS — L408 Other psoriasis: Secondary | ICD-10-CM | POA: Diagnosis not present

## 2022-05-03 DIAGNOSIS — L2089 Other atopic dermatitis: Secondary | ICD-10-CM | POA: Diagnosis not present

## 2022-05-03 DIAGNOSIS — L720 Epidermal cyst: Secondary | ICD-10-CM | POA: Diagnosis not present

## 2022-05-03 DIAGNOSIS — Z872 Personal history of diseases of the skin and subcutaneous tissue: Secondary | ICD-10-CM | POA: Diagnosis not present

## 2022-05-09 ENCOUNTER — Other Ambulatory Visit: Payer: Self-pay | Admitting: Nurse Practitioner

## 2022-05-09 DIAGNOSIS — F17209 Nicotine dependence, unspecified, with unspecified nicotine-induced disorders: Secondary | ICD-10-CM

## 2022-06-17 ENCOUNTER — Other Ambulatory Visit: Payer: Self-pay | Admitting: Nurse Practitioner

## 2022-06-17 DIAGNOSIS — F411 Generalized anxiety disorder: Secondary | ICD-10-CM

## 2022-06-17 DIAGNOSIS — G40909 Epilepsy, unspecified, not intractable, without status epilepticus: Secondary | ICD-10-CM

## 2022-06-17 MED ORDER — ALPRAZOLAM 0.5 MG PO TABS: ORAL_TABLET | ORAL | 2 refills | Status: DC

## 2022-06-17 MED ORDER — LORAZEPAM 1 MG PO TABS: ORAL_TABLET | ORAL | 2 refills | Status: DC

## 2022-06-20 ENCOUNTER — Ambulatory Visit: Payer: Medicare HMO | Admitting: Nurse Practitioner

## 2022-06-30 NOTE — Progress Notes (Signed)
Established patient visit   Patient: Monica Collins   DOB: October 17, 1945   77 y.o. Female  MRN: [id number] Visit Date: 07/01/2022   Chief Complaint  Patient presents with   Hypertension   Depression   Subjective    HPI  Follow up  -elevated blood pressure.  --bp moderately elevated today  --states that she did take her atenolol this morning. Will take another one when she gets home.  -due to have lung cancer screening ==has been ordered and not been performed. -she is current, everyday smoker -she is currently taking wellbutrin 75 mg daily. Prescription written for bid.  -this has helped her reduce her smoking, but she has been unable to quit thus far.  -would like to have referral to [place] dermatology  --dry, rough skin  --was not happy with care she received from prior dermatology provider.  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  --seems to be doing very well.  --alprazolam taken at bedtime  --lorazepam taken twice daily when needed   -She denies chest pain, chest pressure, or shortness of breath. She denies headaches or visual disturbances. she denies abdominal pain, nausea, vomiting, or changes in bowel or bladder habits.     Medications: Outpatient Medications Prior to Visit  Medication Sig   ALPRAZolam (XANAX) 0.5 MG tablet Take 1 tablet po QHS prn   Ascorbic Acid (VITAMIN C PO) Take by mouth.   aspirin 81 MG chewable tablet Chew by mouth daily.   atenolol (TENORMIN) 25 MG tablet TAKE 1 TO 2 TABLETS BY MOUTH DAILY FOR HTN   clobetasol (TEMOVATE) 0.05 % external solution Apply topically 2 (two) times daily.   dicyclomine (BENTYL) 10 MG capsule Take 1 capsule (10 mg total) by mouth 3 (three) times daily as needed for spasms.   ketoconazole (NIZORAL) 2 % shampoo Apply topically 3 (three) times a week.   lamoTRIgine (LAMICTAL) 100 MG tablet Take 1 tablet (100 mg total) by mouth 2 (two) times daily.   LORazepam  (ATIVAN) 1 MG tablet TAKE 1 TABLET BY MOUTH IN THE MORNING AND MAY TAKE 1 IN THE EVENING AS NEEDED   valACYclovir (VALTREX) 500 MG tablet Take 1 tablet po bid for 7 days for fever blister   [DISCONTINUED] buPROPion (WELLBUTRIN) 75 MG tablet Take 1 tablet po qam for one week. May increase dose to twice daily as tolerated   clobetasol cream (TEMOVATE) 0.05 % Apply 1 Application topically 2 (two) times daily. (Patient not taking: Reported on 03/20/2022)   Facility-Administered Medications Prior to Visit  Medication Dose Route Frequency Provider   methylPREDNISolone sodium succinate (SOLU-MEDROL) 40 mg/mL injection 40 mg  40 mg Intramuscular Once Carlean Jews, NP    Review of Systems See HPI    Last CBC Lab Results  Component Value Date   WBC 7.6 08/02/2021   HGB 14.6 08/02/2021   HCT 42.3 08/02/2021   MCV 92 08/02/2021   MCH 31.6 08/02/2021   RDW 12.5 08/02/2021   PLT 171 08/02/2021   Last metabolic panel Lab Results  Component Value Date   GLUCOSE 79 08/02/2021   NA 140 08/02/2021   K 4.2 08/02/2021   CL 100 08/02/2021   CO2 25 08/02/2021   BUN 10 08/02/2021   CREATININE 0.88 08/02/2021   EGFR 69 08/02/2021   CALCIUM 9.5 08/02/2021   PROT 6.8 08/02/2021   ALBUMIN 4.4 08/02/2021   LABGLOB 2.4 08/02/2021   AGRATIO 1.8 08/02/2021  BILITOT 0.6 08/02/2021   ALKPHOS 78 08/02/2021   AST 19 08/02/2021   ALT 12 08/02/2021   ANIONGAP 9 05/13/2017     Last hemoglobin A1c Lab Results  Component Value Date   HGBA1C 5.5 08/02/2021   Last thyroid functions Lab Results  Component Value Date   TSH 2.310 08/02/2021        Objective     Today's Vitals   07/01/22 1500 07/01/22 1536  BP: (Abnormal) 175/80 (Abnormal) 160/80  Pulse: 69   SpO2: 95%   Weight: 107 lb 12.8 oz (48.9 kg)   Height: 4\' 11"  (1.499 m)    Body mass index is 21.77 kg/m.  BP Readings from Last 3 Encounters:  07/01/22 (Abnormal) 160/80  03/20/22 135/80  11/20/21 125/61    Wt Readings  from Last 3 Encounters:  07/01/22 107 lb 12.8 oz (48.9 kg)  03/20/22 112 lb 6.4 oz (51 kg)  11/20/21 106 lb 9.6 oz (48.4 kg)    Physical Exam Vitals and nursing note reviewed.  Constitutional:      Appearance: Normal appearance. She is well-developed.  HENT:     Head: Normocephalic and atraumatic.     Nose: Nose normal.     Mouth/Throat:     Mouth: Mucous membranes are moist.     Pharynx: Oropharynx is clear.  Eyes:     Extraocular Movements: Extraocular movements intact.     Conjunctiva/sclera: Conjunctivae normal.     Pupils: Pupils are equal, round, and reactive to light.  Neck:     Vascular: No carotid bruit.  Cardiovascular:     Rate and Rhythm: Normal rate and regular rhythm.     Pulses: Normal pulses.     Heart sounds: Normal heart sounds.  Pulmonary:     Effort: Pulmonary effort is normal.     Breath sounds: Normal breath sounds.  Abdominal:     Palpations: Abdomen is soft.  Musculoskeletal:        General: Normal range of motion.     Cervical back: Normal range of motion and neck supple.  Lymphadenopathy:     Cervical: No cervical adenopathy.  Skin:    General: Skin is warm and dry.     Capillary Refill: Capillary refill takes less than 2 seconds.  Neurological:     General: No focal deficit present.     Mental Status: She is alert and oriented to person, place, and time. Mental status is at baseline.  Psychiatric:        Attention and Perception: Attention and perception normal.        Mood and Affect: Mood is anxious and depressed.        Speech: Speech normal.        Behavior: Behavior normal. Behavior is cooperative.        Thought Content: Thought content normal.        Cognition and Memory: Cognition and memory normal.        Judgment: Judgment normal.       Assessment & Plan     Problem List Items Addressed This Visit       Cardiovascular and Mediastinum   Essential hypertension - Primary    Generally stable, however, not taking her atenolol  routinely. Discussed importance of taking medication as prescribed. Limit salt and increase daily water intake. Reassess at next visit.         Nervous and Auditory   Seizure disorder    Stable. Continue Lamictal as prescribed and  regular visits with neurology as scheduled.         Other   Anxiety, generalized    Continue lorazepam bid prn and alprazolam at bedtime as needed for acute anxiety. Will refill as indicated.       Cigarette smoker    She is current and everyday smoker. CT chest ordered for surveillance lf pulmonary nodules/       History of cancer of lower lobe bronchus or lung    Ct chest ordered for surveillance of pulmonary nodules after episode of lung cancer.         Return in about 3 months (around 10/01/2022) for blood pressure, mood.         Carlean Jews, NP  [facility] [phone] (phone) [phone] (fax)  [facility]

## 2022-07-01 ENCOUNTER — Encounter: Payer: Self-pay | Admitting: Nurse Practitioner

## 2022-07-01 ENCOUNTER — Ambulatory Visit (INDEPENDENT_AMBULATORY_CARE_PROVIDER_SITE_OTHER): Payer: Medicare HMO | Admitting: Nurse Practitioner

## 2022-07-01 VITALS — BP 160/80 | HR 69 | Ht 59.0 in | Wt 107.8 lb

## 2022-07-01 DIAGNOSIS — F17210 Nicotine dependence, cigarettes, uncomplicated: Secondary | ICD-10-CM | POA: Diagnosis not present

## 2022-07-01 DIAGNOSIS — G40909 Epilepsy, unspecified, not intractable, without status epilepticus: Secondary | ICD-10-CM

## 2022-07-01 DIAGNOSIS — Z85118 Personal history of other malignant neoplasm of bronchus and lung: Secondary | ICD-10-CM

## 2022-07-01 DIAGNOSIS — F411 Generalized anxiety disorder: Secondary | ICD-10-CM

## 2022-07-01 DIAGNOSIS — I10 Essential (primary) hypertension: Secondary | ICD-10-CM

## 2022-07-01 MED ORDER — BUPROPION HCL 75 MG PO TABS: ORAL_TABLET | ORAL | 2 refills | Status: DC

## 2022-07-11 ENCOUNTER — Ambulatory Visit
Admission: RE | Admit: 2022-07-11 | Discharge: 2022-07-11 | Disposition: A | Discharge status: Home / Self Care | Payer: Medicare HMO | Source: Ambulatory Visit | Attending: Nurse Practitioner | Admitting: Nurse Practitioner

## 2022-07-11 DIAGNOSIS — Z122 Encounter for screening for malignant neoplasm of respiratory organs: Secondary | ICD-10-CM | POA: Insufficient documentation

## 2022-07-11 DIAGNOSIS — R918 Other nonspecific abnormal finding of lung field: Secondary | ICD-10-CM | POA: Diagnosis not present

## 2022-07-11 DIAGNOSIS — J432 Centrilobular emphysema: Secondary | ICD-10-CM | POA: Diagnosis not present

## 2022-07-11 DIAGNOSIS — Z85118 Personal history of other malignant neoplasm of bronchus and lung: Secondary | ICD-10-CM | POA: Insufficient documentation

## 2022-07-11 DIAGNOSIS — F17200 Nicotine dependence, unspecified, uncomplicated: Secondary | ICD-10-CM | POA: Diagnosis not present

## 2022-07-15 ENCOUNTER — Telehealth: Payer: Self-pay | Admitting: *Deleted

## 2022-07-15 NOTE — Telephone Encounter (Signed)
[facility] calling to inform provider that imaging is complete and they wanted to be sure that you reviewed the findings. Routing to PCP.

## 2022-07-15 NOTE — Progress Notes (Signed)
Patient to be notified of results. Will repeat CT chest in 6 months to ensure pulmonary nodule is benign. Will also refer to pulmonology if patient is agreeable. Add zetia due to aortic atherosclerosis as she has been very hesitant to start taking statin medication.

## 2022-07-15 NOTE — Telephone Encounter (Signed)
Please let the patient know that CT chest showing new pulmonary nodule, likely benign. It is recommended that we repeat low dose chest CT in 6 months to ensure that it is benign. There is calcifications in the aorta. She has been hesitant to take statins in the past, but, maybe she would consider zetia to prevent further calcifications from forming. Finally, there is evidence of emphysema. It may be wise to consult with pulmonologist. I can refer to her to one if she is willing to  see one.  Thanks so much.   -HB

## 2022-07-16 ENCOUNTER — Other Ambulatory Visit: Payer: Self-pay | Admitting: Nurse Practitioner

## 2022-07-16 DIAGNOSIS — F17200 Nicotine dependence, unspecified, uncomplicated: Secondary | ICD-10-CM

## 2022-07-16 DIAGNOSIS — I700 Atherosclerosis of aorta: Secondary | ICD-10-CM

## 2022-07-16 DIAGNOSIS — Z85118 Personal history of other malignant neoplasm of bronchus and lung: Secondary | ICD-10-CM

## 2022-07-16 DIAGNOSIS — R918 Other nonspecific abnormal finding of lung field: Secondary | ICD-10-CM

## 2022-07-16 MED ORDER — EZETIMIBE 10 MG PO TABS: 10.0000 mg | ORAL_TABLET | Freq: Every day | ORAL | 1 refills | Status: DC

## 2022-07-16 NOTE — Telephone Encounter (Signed)
Contacted pt and informed her of below and she said to please send in the medication and also a referral to pulmonology.  She also wanted to know if you thought that they should go ahead and get this biopsied.  I guess in the past she has had this done previously and wanted to see what your thoughts were. Lera Gaines Zimmerman Rumple, CMA

## 2022-07-16 NOTE — Telephone Encounter (Signed)
LVM at number listed as mobile in chart to call office to inform pt of below.

## 2022-07-16 NOTE — Telephone Encounter (Signed)
Please let her know that I have sent prescription for zetia to take every evening at dinner time. I have also referred her to see Dr. Melvyn Novas. He is with [facility], but has office time in [place]. They should contact her to set this up. I will let them decide about biopsy. With her history, I'm not sure the right answer here. Thanks so much.   -HB

## 2022-07-16 NOTE — Progress Notes (Signed)
Added Zetia and referred to Dr. Melvyn Novas, pulmonology for further evaluation.

## 2022-07-26 ENCOUNTER — Other Ambulatory Visit: Payer: Self-pay | Admitting: Nurse Practitioner

## 2022-07-26 DIAGNOSIS — F411 Generalized anxiety disorder: Secondary | ICD-10-CM

## 2022-08-04 NOTE — Assessment & Plan Note (Signed)
Continue lorazepam bid prn and alprazolam at bedtime as needed for acute anxiety. Will refill as indicated.

## 2022-08-04 NOTE — Assessment & Plan Note (Signed)
>>  ASSESSMENT AND PLAN FOR CIGARETTE SMOKER WRITTEN ON 08/04/2022  5:40 PM BY BOSCIA, HEATHER E, NP  She is current and everyday smoker. CT chest ordered for surveillance lf pulmonary nodules/

## 2022-08-04 NOTE — Assessment & Plan Note (Signed)
Reordered CT chest for surveillance of pulmonary nodules and history of lung cancer.

## 2022-08-04 NOTE — Assessment & Plan Note (Signed)
She is current and everyday smoker. CT chest ordered for surveillance lf pulmonary nodules/

## 2022-08-04 NOTE — Assessment & Plan Note (Signed)
Ct chest ordered for surveillance of pulmonary nodules after episode of lung cancer.

## 2022-08-04 NOTE — Assessment & Plan Note (Signed)
Generally stable, however, not taking her atenolol routinely. Discussed importance of taking medication as prescribed. Limit salt and increase daily water intake. Reassess at next visit.

## 2022-08-04 NOTE — Assessment & Plan Note (Signed)
Stable. Continue Lamictal as prescribed and regular visits with neurology as scheduled.

## 2022-09-10 ENCOUNTER — Other Ambulatory Visit: Payer: Self-pay | Admitting: Nurse Practitioner

## 2022-09-10 DIAGNOSIS — F411 Generalized anxiety disorder: Secondary | ICD-10-CM

## 2022-09-10 DIAGNOSIS — G40909 Epilepsy, unspecified, not intractable, without status epilepticus: Secondary | ICD-10-CM

## 2022-09-10 MED ORDER — LORAZEPAM 1 MG PO TABS: ORAL_TABLET | ORAL | 2 refills | Status: DC

## 2022-09-17 ENCOUNTER — Ambulatory Visit: Payer: Medicare HMO | Admitting: Emergency Medicine

## 2022-09-17 ENCOUNTER — Encounter: Payer: Self-pay | Admitting: Emergency Medicine

## 2022-09-17 VITALS — BP 118/68 | HR 58 | Temp 98.3°F | Ht 59.0 in | Wt 109.4 lb

## 2022-09-17 DIAGNOSIS — J449 Chronic obstructive pulmonary disease, unspecified: Secondary | ICD-10-CM | POA: Diagnosis not present

## 2022-09-17 DIAGNOSIS — R911 Solitary pulmonary nodule: Secondary | ICD-10-CM | POA: Insufficient documentation

## 2022-09-17 DIAGNOSIS — F17209 Nicotine dependence, unspecified, with unspecified nicotine-induced disorders: Secondary | ICD-10-CM | POA: Diagnosis not present

## 2022-09-17 NOTE — Assessment & Plan Note (Signed)
Counseled her today that she needs to cut down.  Ultimate goal will be to stop.  We talked about strategies to decrease and set a goal to get down to 10 cigarettes daily by our next visit.

## 2022-09-17 NOTE — Assessment & Plan Note (Signed)
Minimal symptoms besides cough.  Great functional capacity.  COPD Gold group A.  She is not on any BD therapy.  We will consider pulmonary function testing, bronchodilators going forward.

## 2022-09-17 NOTE — Patient Instructions (Signed)
We reviewed your CT scan of the chest today. We will plan to repeat your CT chest in September 2024 to compare with your prior. Continue to work hard on decreasing your cigarettes.  We set a goal today that you would get down to 10 cigarettes daily by our next visit. We will consider performing pulmonary function testing at a future visit. We will hold off on ordering any inhaler medication at this time but may decide to do so in the future Follow Dr. Delton Coombes in September after your CT scan so we can review those results.

## 2022-09-17 NOTE — Progress Notes (Signed)
Subjective:    Patient ID: Monica Collins, female    DOB: Apr 17, 1946, 77 y.o.   MRN: [id number]  HPI 77 year old woman with history of tobacco use (50 pack years), resected cerebral aneurysm, CVA and seizure disorder (last seizure 04/2021), eczema.  She underwent a left lower lobe lobectomy 09/2016 for stage IIIa lung adenocarcinoma at [facility].  She now participates in lung cancer screening program.  She underwent a screening CT chest 07/11/2022 that showed a new right upper lobe pulmonary nodule as below. She has cut back her smoking, about 15 cig a day. She is very active, walks. She has intermittent cough, has a lot of allergy congestion. Occasional wheeze.   Screening CT scan of the chest 07/11/2022 reviewed by me shows no mediastinal or axillary lymphadenopathy.  Centrilobular emphysema and some scattered parenchymal scarring and changes post left lower lobe lobectomy.  There is a stable 4.5 mm right lower lobe nodule and a new 4.5 mm apical segment right upper lobe pulmonary nodule.   Review of Systems As per HPI  Past Medical History:  Diagnosis Date   Aneurysm (HCC) 10/2017   brain, [facility]   Eczema    Hypertension    Lung cancer (HCC)    Seizures (HCC)    last seizure 05/14/21   Stroke [facility])      Family History  Problem Relation Age of Onset   Cancer Sister    Cancer Brother      Social History   Socioeconomic History   Marital status: Married    Spouse name: Lynwood   Number of children: 2   Years of education: Not on file   Highest education level: Not on file  Occupational History   Not on file  Tobacco Use   Smoking status: Some Days    Packs/day: 1.00    Years: 50.00    Additional pack years: 0.00    Total pack years: 50.00    Types: Cigarettes    Last attempt to quit: 10/21/2016    Years since quitting: 5.9   Smokeless tobacco: Never   Tobacco comments:    11/20/21 pipe tobacco, 3/4 ppd ARJ 09/17/22  Vaping Use   Vaping Use: Some days  Substance and Sexual  Activity   Alcohol use: No    Comment: once a year   Drug use: Never   Sexual activity: Not Currently    Birth control/protection: Post-menopausal  Other Topics Concern   Not on file  Social History Narrative   Lives with husband    Social Determinants of Health   Financial Resource Strain: Medium Risk (03/20/2022)   Overall Financial Resource Strain (CARDIA)    Difficulty of Paying Living Expenses: Somewhat hard  Food Insecurity: No Food Insecurity (03/20/2022)   Hunger Vital Sign    Worried About Running Out of Food in the Last Year: Never true    Ran Out of Food in the Last Year: Never true  Transportation Needs: No Transportation Needs (03/20/2022)   PRAPARE - Administrator, Civil Service (Medical): No    Lack of Transportation (Non-Medical): No  Physical Activity: Insufficiently Active (03/20/2022)   Exercise Vital Sign    Days of Exercise per Week: 3 days    Minutes of Exercise per Session: 30 min  Stress: Stress Concern Present (03/20/2022)   [employer] of Occupational Health - Occupational Stress Questionnaire    Feeling of Stress : To some extent  Social Connections: Moderately Isolated (03/20/2022)   Social  Connection and Isolation Panel [NHANES]    Frequency of Communication with Friends and Family: More than three times a week    Frequency of Social Gatherings with Friends and Family: More than three times a week    Attends Religious Services: Never    Database administrator or Organizations: No    Attends Banker Meetings: Never    Marital Status: Married  Catering manager Violence: Not At Risk (03/20/2022)   Humiliation, Afraid, Rape, and Kick questionnaire    Fear of Current or Ex-Partner: No    Emotionally Abused: No    Physically Abused: No    Sexually Abused: No     Allergies  Allergen Reactions   Copper    Copper-Containing Compounds    Shellfish Allergy    Latex Rash    Rubber cleaning gloves used to clean home.  But less reactive in recent years Rubber cleaning gloves used to clean home. But less reactive in recent years   Nickel Rash   Soap Rash    Detergent     Outpatient Medications Prior to Visit  Medication Sig Dispense Refill   ALPRAZolam (XANAX) 0.5 MG tablet Take 1 tablet po QHS prn 30 tablet 2   Ascorbic Acid (VITAMIN C PO) Take by mouth.     aspirin 81 MG chewable tablet Chew by mouth daily.     atenolol (TENORMIN) 25 MG tablet TAKE 1 TO 2 TABLETS BY MOUTH DAILY FOR HTN 180 tablet 3   buPROPion (WELLBUTRIN) 75 MG tablet TAKE 1 TABLET BY MOUTH EVERY AM FOR ONE WEEK. MAY INCREASE DOSE TO TWICE DAILY AS TOLERATED 180 tablet 0   clobetasol (TEMOVATE) 0.05 % external solution Apply topically 2 (two) times daily.     dicyclomine (BENTYL) 10 MG capsule Take 1 capsule (10 mg total) by mouth 3 (three) times daily as needed for spasms. 90 capsule 2   ezetimibe (ZETIA) 10 MG tablet Take 1 tablet (10 mg total) by mouth daily. 90 tablet 1   ketoconazole (NIZORAL) 2 % shampoo Apply topically 3 (three) times a week.     lamoTRIgine (LAMICTAL) 100 MG tablet Take 1 tablet (100 mg total) by mouth 2 (two) times daily. 180 tablet 4   LORazepam (ATIVAN) 1 MG tablet TAKE 1 TABLET BY MOUTH IN THE MORNING AND MAY TAKE 1 IN THE EVENING AS NEEDED 60 tablet 2   valACYclovir (VALTREX) 500 MG tablet Take 1 tablet po bid for 7 days for fever blister 14 tablet 3   clobetasol cream (TEMOVATE) 0.05 % Apply 1 Application topically 2 (two) times daily. 60 g 3   Facility-Administered Medications Prior to Visit  Medication Dose Route Frequency Provider Last Rate Last Admin   methylPREDNISolone sodium succinate (SOLU-MEDROL) 40 mg/mL injection 40 mg  40 mg Intramuscular Once Carlean Jews, NP            Objective:   Physical Exam Vitals:   09/17/22 1459  BP: 118/68  Pulse: (!) 58  Temp: 98.3 F (36.8 C)  TempSrc: Oral  SpO2: 97%  Weight: 109 lb 6.4 oz (49.6 kg)  Height: 4\' 11"  (1.499 m)    Gen: Pleasant,  thin, in no distress,  normal affect  ENT: No lesions,  mouth clear,  oropharynx clear, no postnasal drip  Neck: No JVD, no stridor  Lungs: No use of accessory muscles, no crackles or wheezing on normal respiration, slight end expiratory wheeze on forced expiration  Cardiovascular: RRR, heart sounds normal, no  murmur or gallops, no peripheral edema  Musculoskeletal: No deformities, no cyanosis or clubbing  Neuro: alert, awake, non focal  Skin: Warm, no lesions or rash       Assessment & Plan:   COPD (chronic obstructive pulmonary disease) (HCC) Minimal symptoms besides cough.  Great functional capacity.  COPD Gold group A.  She is not on any BD therapy.  We will consider pulmonary function testing, bronchodilators going forward.  Tobacco use disorder, continuous Counseled her today that she needs to cut down.  Ultimate goal will be to stop.  We talked about strategies to decrease and set a goal to get down to 10 cigarettes daily by our next visit.  Pulmonary nodule New 4.5 mm pulmonary nodule noted on her screening CT 06/2022.  She is high risk patient, continues to smoke, history of lung cancer and left lower lobe lobectomy.  Discussed possible future bronchoscopy with her today.  At this time next step is repeat CT scan of the chest in 6 months to look for interval stability, change.   Levy Pupa, MD, PhD 09/17/2022, 3:27 PM [facility] [phone] or if no answer before 7:00PM call [phone] For any issues after 7:00PM please call eLink [phone]

## 2022-09-17 NOTE — Assessment & Plan Note (Signed)
New 4.5 mm pulmonary nodule noted on her screening CT 06/2022.  She is high risk patient, continues to smoke, history of lung cancer and left lower lobe lobectomy.  Discussed possible future bronchoscopy with her today.  At this time next step is repeat CT scan of the chest in 6 months to look for interval stability, change.

## 2022-10-01 ENCOUNTER — Encounter: Payer: Self-pay | Admitting: Nurse Practitioner

## 2022-10-01 ENCOUNTER — Ambulatory Visit (INDEPENDENT_AMBULATORY_CARE_PROVIDER_SITE_OTHER): Payer: Medicare HMO | Admitting: Nurse Practitioner

## 2022-10-01 VITALS — BP 117/71 | HR 68 | Ht 59.0 in | Wt 108.1 lb

## 2022-10-01 DIAGNOSIS — J449 Chronic obstructive pulmonary disease, unspecified: Secondary | ICD-10-CM

## 2022-10-01 DIAGNOSIS — G40909 Epilepsy, unspecified, not intractable, without status epilepticus: Secondary | ICD-10-CM | POA: Diagnosis not present

## 2022-10-01 DIAGNOSIS — R252 Cramp and spasm: Secondary | ICD-10-CM | POA: Diagnosis not present

## 2022-10-01 DIAGNOSIS — F411 Generalized anxiety disorder: Secondary | ICD-10-CM | POA: Diagnosis not present

## 2022-10-01 DIAGNOSIS — I10 Essential (primary) hypertension: Secondary | ICD-10-CM | POA: Diagnosis not present

## 2022-10-01 DIAGNOSIS — E782 Mixed hyperlipidemia: Secondary | ICD-10-CM

## 2022-10-01 DIAGNOSIS — E611 Iron deficiency: Secondary | ICD-10-CM | POA: Diagnosis not present

## 2022-10-01 DIAGNOSIS — E559 Vitamin D deficiency, unspecified: Secondary | ICD-10-CM | POA: Diagnosis not present

## 2022-10-01 DIAGNOSIS — R5383 Other fatigue: Secondary | ICD-10-CM | POA: Diagnosis not present

## 2022-10-01 DIAGNOSIS — R2990 Unspecified symptoms and signs involving the nervous system: Secondary | ICD-10-CM

## 2022-10-01 MED ORDER — ALPRAZOLAM 0.5 MG PO TABS: ORAL_TABLET | ORAL | 2 refills | Status: DC

## 2022-10-01 NOTE — Progress Notes (Signed)
Established patient visit   Patient: Monica Collins   DOB: 11-05-1945   77 y.o. Female  MRN: [id number] Visit Date: 10/01/2022   Chief Complaint  Patient presents with   Medical Management of Chronic Issues   Subjective    HPI  Follow up  -feeling tired -state that she did get insect bite on right lower abdomen. She doesn't know what kind of insect bit her . -also was bit by a cat on her right hand which resulted in swelling and pain in the right hand and arm  -having increased family stress which is bringing her down  Cramping in her legs and feet.  Most severe at night.  Waking her up, has to stand up and put pressure on her feet before the pain will go away.   She is now seeing pulmonology due to abnormal lung cancer screen.  -doing well.   Seizures -continues to see neurology -they prescribe lamictal.  -she is currently taking alprazolam at night only and lorazepam twice daily. To help with "silent seizures.'   -She denies chest pain, chest pressure, or shortness of breath. She denies headaches or visual disturbances. She denies abdominal pain, nausea, vomiting, or changes in bowel or bladder habits.    Medications: Outpatient Medications Prior to Visit  Medication Sig   Ascorbic Acid (VITAMIN C PO) Take by mouth.   aspirin 81 MG chewable tablet Chew by mouth daily.   atenolol (TENORMIN) 25 MG tablet TAKE 1 TO 2 TABLETS BY MOUTH DAILY FOR HTN   clobetasol (TEMOVATE) 0.05 % external solution Apply topically 2 (two) times daily.   clobetasol cream (TEMOVATE) 0.05 % Apply 1 Application topically 2 (two) times daily.   dicyclomine (BENTYL) 10 MG capsule Take 1 capsule (10 mg total) by mouth 3 (three) times daily as needed for spasms.   ezetimibe (ZETIA) 10 MG tablet Take 1 tablet (10 mg total) by mouth daily.   ketoconazole (NIZORAL) 2 % shampoo Apply topically 3 (three) times a week.   lamoTRIgine (LAMICTAL) 100 MG tablet Take 1 tablet (100 mg total) by mouth 2 (two)  times daily.   LORazepam (ATIVAN) 1 MG tablet TAKE 1 TABLET BY MOUTH IN THE MORNING AND MAY TAKE 1 IN THE EVENING AS NEEDED   valACYclovir (VALTREX) 500 MG tablet Take 1 tablet po bid for 7 days for fever blister   [DISCONTINUED] ALPRAZolam (XANAX) 0.5 MG tablet Take 1 tablet po QHS prn   [DISCONTINUED] buPROPion (WELLBUTRIN) 75 MG tablet TAKE 1 TABLET BY MOUTH EVERY AM FOR ONE WEEK. MAY INCREASE DOSE TO TWICE DAILY AS TOLERATED   Facility-Administered Medications Prior to Visit  Medication Dose Route Frequency Provider   methylPREDNISolone sodium succinate (SOLU-MEDROL) 40 mg/mL injection 40 mg  40 mg Intramuscular Once Carlean Jews, NP    Review of Systems See HPI     Last CBC Lab Results  Component Value Date   WBC 7.6 08/02/2021   HGB 14.6 08/02/2021   HCT 42.3 08/02/2021   MCV 92 08/02/2021   MCH 31.6 08/02/2021   RDW 12.5 08/02/2021   PLT 171 08/02/2021   Last metabolic panel Lab Results  Component Value Date   GLUCOSE 79 08/02/2021   NA 140 08/02/2021   K 4.2 08/02/2021   CL 100 08/02/2021   CO2 25 08/02/2021   BUN 10 08/02/2021   CREATININE 0.88 08/02/2021   EGFR 69 08/02/2021   CALCIUM 9.5 08/02/2021   PROT 6.8 08/02/2021   ALBUMIN 4.4 08/02/2021  LABGLOB 2.4 08/02/2021   AGRATIO 1.8 08/02/2021   BILITOT 0.6 08/02/2021   ALKPHOS 78 08/02/2021   AST 19 08/02/2021   ALT 12 08/02/2021   ANIONGAP 9 05/13/2017    Last hemoglobin A1c Lab Results  Component Value Date   HGBA1C 5.5 08/02/2021   Last thyroid functions Lab Results  Component Value Date   TSH 2.310 08/02/2021        Objective     Today's Vitals   10/01/22 1441 10/01/22 1516  BP: (Abnormal) 108/54 117/71  Pulse: 68   SpO2: 95%   Weight: 108 lb 1.9 oz (49 kg)   Height: 4\' 11"  (1.499 m)    Body mass index is 21.84 kg/m.  BP Readings from Last 3 Encounters:  10/01/22 117/71  09/17/22 118/68  07/01/22 (Abnormal) 160/80    Wt Readings from Last 3 Encounters:  10/01/22  108 lb 1.9 oz (49 kg)  09/17/22 109 lb 6.4 oz (49.6 kg)  07/01/22 107 lb 12.8 oz (48.9 kg)    Physical Exam Vitals and nursing note reviewed.  Constitutional:      Appearance: Normal appearance. She is well-developed.  HENT:     Head: Normocephalic and atraumatic.     Nose: Nose normal.     Mouth/Throat:     Mouth: Mucous membranes are moist.     Pharynx: Oropharynx is clear.  Eyes:     Extraocular Movements: Extraocular movements intact.     Conjunctiva/sclera: Conjunctivae normal.     Pupils: Pupils are equal, round, and reactive to light.  Neck:     Vascular: No carotid bruit.  Cardiovascular:     Rate and Rhythm: Normal rate and regular rhythm.     Pulses: Normal pulses.     Heart sounds: Normal heart sounds.  Pulmonary:     Effort: Pulmonary effort is normal. No respiratory distress.     Breath sounds: Normal breath sounds.  Abdominal:     Palpations: Abdomen is soft.  Musculoskeletal:        General: Normal range of motion.     Cervical back: Normal range of motion and neck supple.  Lymphadenopathy:     Cervical: No cervical adenopathy.  Skin:    General: Skin is warm and dry.     Capillary Refill: Capillary refill takes less than 2 seconds.  Neurological:     General: No focal deficit present.     Mental Status: She is alert and oriented to person, place, and time. Mental status is at baseline.  Psychiatric:        Attention and Perception: Attention and perception normal.        Mood and Affect: Mood is anxious and depressed. Affect is tearful.        Speech: Speech normal.        Behavior: Behavior normal. Behavior is cooperative.        Thought Content: Thought content normal.        Cognition and Memory: Cognition and memory normal.        Judgment: Judgment normal.       Assessment & Plan    Neurological complaint -     TSH + free T4; Future -     Hemoglobin A1c; Future  Anxiety, generalized -     ALPRAZolam; Take 1 tablet po QHS prn  Dispense: 30  tablet; Refill: 2  Other fatigue -     TSH + free T4; Future -     VITAMIN D  25 Hydroxy (Vit-D Deficiency, Fractures); Future -     Hemoglobin A1c; Future -     CBC; Future -     B12 and Folate Panel; Future -     Ferritin; Future  Muscle cramp  Vitamin D deficiency -     TSH + free T4; Future -     VITAMIN D 25 Hydroxy (Vit-D Deficiency, Fractures); Future  Iron deficiency -     CBC; Future -     B12 and Folate Panel; Future -     Ferritin; Future  Essential hypertension -     Lipid panel; Future -     Comprehensive metabolic panel; Future  Mixed hyperlipidemia -     Lipid panel; Future -     Comprehensive metabolic panel; Future     Return in about 3 months (around 01/01/2023) for mood, med refills - will need 40 minutes .  MWV 02/2023.         Carlean Jews, NP  [facility] [phone] (phone) [phone] (fax)  [facility]

## 2022-10-09 ENCOUNTER — Telehealth: Payer: Self-pay | Admitting: Emergency Medicine

## 2022-10-09 NOTE — Telephone Encounter (Signed)
I do not see that it was a [facility] that call I would guess it was the nurse.

## 2022-10-09 NOTE — Telephone Encounter (Signed)
Pt states she got a call from our number and wondered what it regarded. Pls call to advise @ [phone]

## 2022-10-11 NOTE — Telephone Encounter (Signed)
Spoke with patients daughter. Advised I couldn't tell where anyone tried to reach her. Daughter states she may have gotten confused. Advised her to call back if she needs anything

## 2022-10-15 DIAGNOSIS — L853 Xerosis cutis: Secondary | ICD-10-CM | POA: Diagnosis not present

## 2022-10-15 DIAGNOSIS — L821 Other seborrheic keratosis: Secondary | ICD-10-CM | POA: Diagnosis not present

## 2022-10-15 DIAGNOSIS — L218 Other seborrheic dermatitis: Secondary | ICD-10-CM | POA: Diagnosis not present

## 2022-10-15 DIAGNOSIS — L720 Epidermal cyst: Secondary | ICD-10-CM | POA: Diagnosis not present

## 2022-10-20 DIAGNOSIS — R5383 Other fatigue: Secondary | ICD-10-CM | POA: Insufficient documentation

## 2022-10-20 DIAGNOSIS — E559 Vitamin D deficiency, unspecified: Secondary | ICD-10-CM | POA: Insufficient documentation

## 2022-10-20 DIAGNOSIS — R252 Cramp and spasm: Secondary | ICD-10-CM | POA: Insufficient documentation

## 2022-10-20 DIAGNOSIS — E611 Iron deficiency: Secondary | ICD-10-CM | POA: Insufficient documentation

## 2022-10-20 DIAGNOSIS — E782 Mixed hyperlipidemia: Secondary | ICD-10-CM | POA: Insufficient documentation

## 2022-10-20 NOTE — Assessment & Plan Note (Signed)
Check labs for further evaluation.  

## 2022-10-20 NOTE — Assessment & Plan Note (Signed)
Labs ordered, included thyroid and anemia panels for further evaluation.  Treat as indicated.

## 2022-10-20 NOTE — Assessment & Plan Note (Signed)
Blood pressure improved, even on the low side of normal. -Continue medications as prescribed. -Encouraged increased water intake. -Lower pressure may be contributing to increased fatigue.

## 2022-10-20 NOTE — Assessment & Plan Note (Signed)
Check vitamin d level and treat deficiency as indicated.   

## 2022-10-20 NOTE — Assessment & Plan Note (Signed)
Will check labs including ferritin and B12 levels for further evaluation.

## 2022-10-20 NOTE — Assessment & Plan Note (Signed)
Patient is now seeing a pulmonologist for management.

## 2022-10-20 NOTE — Assessment & Plan Note (Signed)
May continue alprazolam 0.5 mg at bedtime as needed. May continue lorazepam 0.5 mg twice daily if needed. New prescription for both medications sent to her pharmacy today.

## 2022-10-20 NOTE — Assessment & Plan Note (Signed)
Generally stable. -Taking Lamictal prescribed by neurology. -Taking lorazepam twice during the day as needed. -Taking alprazolam 0.5 mg at bedtime if needed. -New prescriptions were sent to the pharmacy for both medications.

## 2022-11-25 ENCOUNTER — Ambulatory Visit: Payer: Medicare HMO | Admitting: Diagnostic Neuroimaging

## 2022-11-29 ENCOUNTER — Ambulatory Visit
Admission: RE | Admit: 2022-11-29 | Discharge: 2022-11-29 | Disposition: A | Discharge status: Home / Self Care | Payer: Medicare HMO | Source: Ambulatory Visit | Attending: Family Medicine

## 2022-11-29 VITALS — BP 164/66 | HR 60 | Temp 98.0°F | Resp 16

## 2022-11-29 DIAGNOSIS — R350 Frequency of micturition: Secondary | ICD-10-CM

## 2022-11-29 LAB — POCT URINALYSIS DIP (MANUAL ENTRY)
Blood, UA: NEGATIVE
Glucose, UA: NEGATIVE mg/dL
Ketones, POC UA: NEGATIVE mg/dL
Nitrite, UA: NEGATIVE
Spec Grav, UA: >=1.03 — AB (ref 1.010–1.025)
Urobilinogen, UA: 0.2 E.U./dL
pH, UA: 5.5 (ref 5.0–8.0)

## 2022-11-29 NOTE — ED Provider Notes (Signed)
[facility]    CSN: [id number] Arrival date & time: 11/29/22  1418      History   Chief Complaint Chief Complaint  Patient presents with   Urinary Frequency    Dark color & odor along with incontrollable frequency. UTI suspected. - Entered by patient    HPI Monica Collins is a 77 y.o. female.   HPI Patient here today with concern for possible UTI. Patient is experiencing urinary frequency and concern that her urine is dark. She also reports that her husband has noticed that she is more irritable than her baseline. She has had UTI in the past. Symptoms present for one month. Patient endorses bilateral lower back pain. She reports anytime water is running, she has to go to the restroom and sometimes is incontinent of urine. She is established with her PCP and has an appointment in two weeks.  No fever, nausea, or vomiting. Past Medical History:  Diagnosis Date   Aneurysm (HCC) 10/2017   brain, [facility]   Eczema    Hypertension    Lung cancer (HCC)    Seizures (HCC)    last seizure 05/14/21   Stroke [facility])     Patient Active Problem List   Diagnosis Date Noted   Other fatigue 10/20/2022   Iron deficiency 10/20/2022   Muscle cramp 10/20/2022   Mixed hyperlipidemia 10/20/2022   Vitamin D deficiency 10/20/2022   COPD (chronic obstructive pulmonary disease) (HCC) 09/17/2022   Pulmonary nodule 09/17/2022   Abdominal discomfort 08/06/2021   Abnormal weight gain 08/06/2021   Fever blister 08/06/2021   Neoplasm of uncertain behavior of skin of face 08/06/2021   Irritable bowel syndrome with diarrhea 03/13/2021   Tobacco use disorder, continuous 03/13/2021   Fall at home 02/06/2021   Rib pain on left side 02/06/2021   Abrasion of skin of left upper arm 02/06/2021   Memory deficit after cerebral infarction 02/06/2021   Encounter for screening for lung cancer 01/14/2021   Current every day smoker 01/14/2021   Screening for colon cancer 01/14/2021   Encounter for Medicare  annual wellness exam 08/07/2020   History of cancer of lower lobe bronchus or lung 08/07/2020   Neurological complaint 04/16/2020   Heart palpitations 01/06/2020   History of seizure 10/25/2019   Encounter for general adult medical examination with abnormal findings 02/16/2019   Cigarette smoker 02/16/2019   Encounter for screening mammogram for malignant neoplasm of breast 02/16/2019   Restless legs syndrome (RLS) 10/22/2018   Diarrhea 04/15/2018   Nausea 04/15/2018   Atopic dermatitis 04/15/2018   Seizure disorder (HCC) 04/15/2018   Encounter for long-term (current) use of high-risk medication 02/15/2018   Osteoarthritis 02/15/2018   Essential hypertension 01/07/2018   History of surgery for cerebral aneurysm 01/07/2018   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      Home Medications    Prior to Admission medications   Medication Sig Start Date End Date Taking? Authorizing Provider  ALPRAZolam Prudy Feeler)  0.5 MG tablet Take 1 tablet po QHS prn 10/01/22   Carlean Jews, NP  Ascorbic Acid (VITAMIN C PO) Take by mouth.    [provider]  aspirin 81 MG chewable tablet Chew by mouth daily.    [provider]  atenolol (TENORMIN) 25 MG tablet TAKE 1 TO 2 TABLETS BY MOUTH DAILY FOR HTN 01/14/22   Carlean Jews, NP  clobetasol (TEMOVATE) 0.05 % external solution Apply topically 2 (two) times daily. 02/28/22   [provider]  clobetasol cream (TEMOVATE) 0.05 %  Apply 1 Application topically 2 (two) times daily. 11/15/21   Carlean Jews, NP  dicyclomine (BENTYL) 10 MG capsule Take 1 capsule (10 mg total) by mouth 3 (three) times daily as needed for spasms. 03/13/21   Carlean Jews, NP  ezetimibe (ZETIA) 10 MG tablet Take 1 tablet (10 mg total) by mouth daily. 07/16/22   Carlean Jews, NP  ketoconazole (NIZORAL) 2 % shampoo Apply topically 3 (three) times a week. 02/20/22   [provider]  lamoTRIgine (LAMICTAL) 100 MG tablet Take 1 tablet (100 mg total) by mouth 2 (two) times daily. 11/20/21   Penumalli, Glenford Bayley, MD  LORazepam (ATIVAN) 1 MG tablet TAKE 1 TABLET BY MOUTH IN THE MORNING AND MAY TAKE 1 IN THE EVENING AS NEEDED 09/10/22   Carlean Jews, NP  valACYclovir (VALTREX) 500 MG tablet Take 1 tablet po bid for 7 days for fever blister 08/02/21   Carlean Jews, NP    Family History Family History  Problem Relation Age of Onset   Cancer Sister    Cancer Brother     Social History Social History   Tobacco Use   Smoking status: Some Days    Current packs/day: 0.00    Average packs/day: 1 pack/day for 50.0 years (50.0 ttl pk-yrs)    Types: Cigarettes    Start date: 10/22/1966    Last attempt to quit: 10/21/2016    Years since quitting: 6.1   Smokeless tobacco: Never   Tobacco comments:    11/20/21 pipe tobacco, 3/4 ppd ARJ 09/17/22  Vaping Use   Vaping status: Some Days  Substance Use Topics   Alcohol use: No    Comment: once a year   Drug use: Never     Allergies   Copper, Copper-containing compounds, Shellfish allergy, Latex, Nickel, and Soap   Review of Systems Review of Systems Pertinent negatives listed in HPI   Physical Exam Triage Vital Signs ED Triage Vitals [11/29/22 1444]  Encounter Vitals Group     BP (!) 164/66     Systolic BP Percentile      Diastolic BP Percentile      Pulse Rate 60     Resp 16     Temp 98 F (36.7 C)     Temp Source Temporal     SpO2 97 %     Weight      Height       Head Circumference      Peak Flow      Pain Score 0     Pain Loc      Pain Education      Exclude from Growth Chart    No data found.  Updated Vital Signs BP (!) 164/66 (BP Location: Left Arm)   Pulse 60   Temp 98 F (36.7 C) (Temporal)   Resp 16   SpO2 97%   Visual Acuity Right Eye Distance:  Left Eye Distance:   Bilateral Distance:    Right Eye Near:   Left Eye Near:    Bilateral Near:     Physical Exam Constitutional:      Appearance: Normal appearance.  HENT:     Head: Normocephalic and atraumatic.  Eyes:     Extraocular Movements: Extraocular movements intact.     Pupils: Pupils are equal, round, and reactive to light.  Cardiovascular:     Rate and Rhythm: Normal rate and regular rhythm.  Pulmonary:     Effort: Pulmonary effort is normal.     Breath sounds: Normal breath sounds.  Musculoskeletal:       Arms:  Neurological:     Mental Status: She is alert. Mental status is at baseline.    UC Treatments / Results  Labs (all labs ordered are listed, but only abnormal results are displayed) Labs Reviewed  POCT URINALYSIS DIP (MANUAL ENTRY) - Abnormal; Notable for the following components:      Result Value   Bilirubin, UA small (*)    Spec Grav, UA >=1.030 (*)    Protein Ur, POC trace (*)    Leukocytes, UA Trace (*)    All other components within normal limits  URINE CULTURE    EKG   Radiology No results found.  Procedures Procedures (including critical care time)  Medications Ordered in UC Medications - No data to display  Initial Impression / Assessment and Plan / UC Course  I have reviewed the triage vital signs and the nursing notes.  Pertinent labs & imaging results that were available during my care of the patient were reviewed by me and considered in my medical decision making (see chart for details).    UA  unremarkable, however a trace of leukocytes present -urine culture pending to ensure no bacterial growth. Encouraged  patient to keep follow-up with PCP and discuss concerns further. Patient verbalized understanding and agreement with plan.  Final Clinical Impressions(s) / UC Diagnoses   Final diagnoses:  Urine frequency     Discharge Instructions      Follow-up with primary care provider for further work-up of symptoms.  Your urine test is not significant for a UTI. I will order a culture given the length of time symptoms have been present to ensure no bacterial growth.  Your urine is concentrated and you should try and hydrate well with water this will improve the color of your urine.   ED Prescriptions   None    PDMP not reviewed this encounter.   Bing Neighbors, NP 11/29/22 [phone]

## 2022-11-29 NOTE — Discharge Instructions (Addendum)
Follow-up with primary care provider for further work-up of symptoms.  Your urine test is not significant for a UTI. I will order a culture given the length of time symptoms have been present to ensure no bacterial growth.  Your urine is concentrated and you should try and hydrate well with water this will improve the color of your urine.

## 2022-11-29 NOTE — ED Triage Notes (Signed)
Patient presents to UC for urinary freq, dark and malodorous urine x 1 month. Concerned with UTI. Treating discomfort with aleve.

## 2022-12-02 ENCOUNTER — Ambulatory Visit: Payer: Medicare HMO | Admitting: Family Medicine

## 2022-12-04 ENCOUNTER — Other Ambulatory Visit: Payer: Self-pay | Admitting: Family Medicine

## 2022-12-04 ENCOUNTER — Telehealth: Payer: Self-pay | Admitting: *Deleted

## 2022-12-04 DIAGNOSIS — F411 Generalized anxiety disorder: Secondary | ICD-10-CM

## 2022-12-04 MED ORDER — ALPRAZOLAM 0.5 MG PO TABS
ORAL_TABLET | ORAL | 2 refills | Status: DC
Start: 2022-12-04 — End: 2022-12-27

## 2022-12-04 NOTE — Telephone Encounter (Signed)
Prescription Request  12/04/2022  LOV: 10/01/22   What is the name of the medication or equipment? ALPRAZolam (XANAX) 0.5 MG tablet   Have you contacted your pharmacy to request a refill? Yes   Which pharmacy would you like this sent to?  [phone]  [id number] - [place], [place] - [address] [place] [address] Phone: [phone] Fax: [phone]   Patient notified that their request is being sent to the clinical staff for review and that they should receive a response within 2 business days.   Please advise at [facility] [phone]

## 2022-12-04 NOTE — Telephone Encounter (Signed)
Refill sent in

## 2022-12-05 ENCOUNTER — Other Ambulatory Visit: Payer: Self-pay | Admitting: Family Medicine

## 2022-12-05 DIAGNOSIS — F411 Generalized anxiety disorder: Secondary | ICD-10-CM

## 2022-12-05 DIAGNOSIS — G40909 Epilepsy, unspecified, not intractable, without status epilepticus: Secondary | ICD-10-CM

## 2022-12-05 MED ORDER — LORAZEPAM 1 MG PO TABS: ORAL_TABLET | ORAL | 2 refills | Status: DC

## 2022-12-05 NOTE — Telephone Encounter (Signed)
Lorazepam sent in.

## 2022-12-05 NOTE — Telephone Encounter (Signed)
Pt said she is in need of refill for  LORazepam (ATIVAN) 1 MG tablet  Not ALPRAZolam (XANAX) 0.5 MG.

## 2022-12-06 ENCOUNTER — Other Ambulatory Visit: Payer: Self-pay | Admitting: Nurse Practitioner

## 2022-12-06 DIAGNOSIS — I10 Essential (primary) hypertension: Secondary | ICD-10-CM

## 2022-12-09 ENCOUNTER — Ambulatory Visit: Payer: Medicare HMO | Admitting: Diagnostic Neuroimaging

## 2022-12-09 ENCOUNTER — Encounter: Payer: Self-pay | Admitting: Diagnostic Neuroimaging

## 2022-12-09 VITALS — BP 147/64 | HR 65 | Ht 59.0 in | Wt 103.0 lb

## 2022-12-09 DIAGNOSIS — G40909 Epilepsy, unspecified, not intractable, without status epilepticus: Secondary | ICD-10-CM

## 2022-12-09 MED ORDER — LAMOTRIGINE 25 MG PO TABS
50.0000 mg | ORAL_TABLET | Freq: Two times a day (BID) | ORAL | 4 refills | Status: DC
Start: 2022-12-09 — End: 2024-02-03

## 2022-12-09 NOTE — Patient Instructions (Signed)
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

## 2022-12-09 NOTE — Progress Notes (Signed)
[facility]  Monica Collins: Monica Collins DOB: [birth year]  REFERRING CLINICIAN: Carlean Jews, NP HISTORY FROM: Monica Collins and husband REASON FOR VISIT: follow up   HISTORICAL  CHIEF COMPLAINT:  Chief Complaint  Monica Collins presents with   Follow-up    Rm 7, here with husband Larita Fife Monica Collins is following up on seizure. Monica Collins states she has had 2 seizures within the past 2 weeks.     HISTORY OF PRESENT ILLNESS:   UPDATE (12/09/22, [facility]P): Since last visit, doing well, until 2 weeks ago, had 2 seizures. May have been late on meds. Also only on lamotrigine 25mg  twice a day.   UPDATE (11/20/21, [facility]P): Since last visit, doing well. Symptoms are stable. No seizures. Tolerating meds.  UPDATE (05/21/21, VRP): Since last visit, tried lamotrigine for a while, then stopped b/c husband thought sz were worsening. But was tapering off benzos at that time also. Last sz ~05/12/21; now restarted lamotrigine after that sz.  PRIOR HPI (11/14/20): 77 year old female here for evaluation of seizures.  2018 Monica Collins was being treated for stage II lung cancer and had screening MRI of the brain.  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  She underwent craniotomy and aneurysm clipping on 10/23/2016.  Monica Collins had a postoperative stroke affecting right MCA region.  Monica Collins then had clinical and electrographic seizures in the hospital.  Monica Collins had confusion, staring spell, abnormal movements of her left arm and hand, grimacing and abnormal facial movements.  She was treated with Keppra.  Monica Collins had side effects with Keppra and this was changed to carbamazepine which also exacerbate his symptoms.  She stopped taking antiseizure medications.  Of note Monica Collins has been on Xanax since 1992.  This is been used for anxiety and IBS.  Since she had seizure disorder she was managed with these benzodiazepines to help with seizure control.  Now Monica Collins on combination of Xanax and Ativan.  Sometimes she misses a  dose of Xanax and usually has a breakthrough seizure.  This has been happening every 3 to 4 weeks.   REVIEW OF SYSTEMS: Full 14 system review of systems performed and negative with exception of: as per HPI.  ALLERGIES: Allergies  Allergen Reactions   Copper    Copper-Containing Compounds    Shellfish Allergy    Latex Rash    Rubber cleaning gloves used to clean home. But less reactive in recent years Rubber cleaning gloves used to clean home. But less reactive in recent years   Nickel Rash   Soap Rash    Detergent    HOME MEDICATIONS: Outpatient Medications Prior to Visit  Medication Sig Dispense Refill   ALPRAZolam (XANAX) 0.5 MG tablet Take 1 tablet po QHS prn 30 tablet 2   Ascorbic Acid (VITAMIN C PO) Take by mouth.     aspirin 81 MG chewable tablet Chew by mouth daily.     atenolol (TENORMIN) 25 MG tablet TAKE 1 TO 2 TABLETS BY MOUTH DAILY FOR HIGH BLOOD PRESSURE 180 tablet 0   clobetasol (TEMOVATE) 0.05 % external solution Apply topically 2 (two) times daily.     dicyclomine (BENTYL) 10 MG capsule Take 1 capsule (10 mg total) by mouth 3 (three) times daily as needed for spasms. 90 capsule 2   ezetimibe (ZETIA) 10 MG tablet Take 1 tablet (10 mg total) by mouth daily. 90 tablet 1   LORazepam (ATIVAN) 1 MG tablet TAKE 1 TABLET BY MOUTH IN THE MORNING AND MAY TAKE 1 IN THE EVENING  AS NEEDED 60 tablet 2   valACYclovir (VALTREX) 500 MG tablet Take 1 tablet po bid for 7 days for fever blister 14 tablet 3   ketoconazole (NIZORAL) 2 % shampoo Apply topically 3 (three) times a week.     lamoTRIgine (LAMICTAL) 100 MG tablet Take 1 tablet (100 mg total) by mouth 2 (two) times daily. 180 tablet 4   clobetasol cream (TEMOVATE) 0.05 % Apply 1 Application topically 2 (two) times daily. (Monica Collins not taking: Reported on 12/09/2022) 60 g 3   Facility-Administered Medications Prior to Visit  Medication Dose Route Frequency Provider Last Rate Last Admin   methylPREDNISolone sodium succinate  (SOLU-MEDROL) 40 mg/mL injection 40 mg  40 mg Intramuscular Once Carlean Jews, NP        PAST MEDICAL HISTORY: Past Medical History:  Diagnosis Date   Aneurysm (HCC) 10/2017   brain, [facility]   Eczema    Hypertension    Lung cancer (HCC)    Seizures (HCC)    last seizure 05/14/21   Stroke (HCC)     PAST SURGICAL HISTORY: Past Surgical History:  Procedure Laterality Date   CHOLECYSTECTOMY     LUNG CANCER SURGERY  2019    FAMILY HISTORY: Family History  Problem Relation Age of Onset   Cancer Sister    Cancer Brother     SOCIAL HISTORY: Social History   Socioeconomic History   Marital status: Married    Spouse name: Lynwood   Number of children: 2   Years of education: Not on file   Highest education level: Not on file  Occupational History   Not on file  Tobacco Use   Smoking status: Some Days    Current packs/day: 0.00    Average packs/day: 1 pack/day for 50.0 years (50.0 ttl pk-yrs)    Types: Cigarettes    Start date: 10/22/1966    Last attempt to quit: 10/21/2016    Years since quitting: 6.1   Smokeless tobacco: Never   Tobacco comments:    11/20/21 pipe tobacco, 3/4 ppd ARJ 09/17/22  Vaping Use   Vaping status: Some Days  Substance and Sexual Activity   Alcohol use: No    Comment: once a year   Drug use: Never   Sexual activity: Not Currently    Birth control/protection: Post-menopausal  Other Topics Concern   Not on file  Social History Narrative   Lives with husband    Social Determinants of Health   Financial Resource Strain: Medium Risk (03/20/2022)   Overall Financial Resource Strain (CARDIA)    Difficulty of Paying Living Expenses: Somewhat hard  Food Insecurity: No Food Insecurity (03/20/2022)   Hunger Vital Sign    Worried About Running Out of Food in the Last Year: Never true    Ran Out of Food in the Last Year: Never true  Transportation Needs: No Transportation Needs (03/20/2022)   PRAPARE - Administrator, Civil Service  (Medical): No    Lack of Transportation (Non-Medical): No  Physical Activity: Insufficiently Active (03/20/2022)   Exercise Vital Sign    Days of Exercise per Week: 3 days    Minutes of Exercise per Session: 30 min  Stress: Stress Concern Present (03/20/2022)   [employer] of Occupational Health - Occupational Stress Questionnaire    Feeling of Stress : To some extent  Social Connections: Moderately Isolated (03/20/2022)   Social Connection and Isolation Panel [NHANES]    Frequency of Communication with Friends and Family: More than three  times a week    Frequency of Social Gatherings with Friends and Family: More than three times a week    Attends Religious Services: Never    Database administrator or Organizations: No    Attends Banker Meetings: Never    Marital Status: Married  Catering manager Violence: Not At Risk (03/20/2022)   Humiliation, Afraid, Rape, and Kick questionnaire    Fear of Current or Ex-Partner: No    Emotionally Abused: No    Physically Abused: No    Sexually Abused: No     PHYSICAL EXAM  GENERAL EXAM/CONSTITUTIONAL: Vitals:  Vitals:   12/09/22 1454  BP: (!) 147/64  Pulse: 65  Weight: 103 lb (46.7 kg)  Height: 4\' 11"  (1.499 m)   Body mass index is 20.8 kg/m. Wt Readings from Last 3 Encounters:  12/09/22 103 lb (46.7 kg)  10/01/22 108 lb 1.9 oz (49 kg)  09/17/22 109 lb 6.4 oz (49.6 kg)   Monica Collins is in no distress; well developed, nourished and groomed; neck is supple  CARDIOVASCULAR: Examination of carotid arteries is normal; no carotid bruits Regular rate and rhythm, no murmurs Examination of peripheral vascular system by observation and palpation is normal  EYES: Ophthalmoscopic exam of optic discs and posterior segments is normal; no papilledema or hemorrhages No results found.  MUSCULOSKELETAL: Gait, strength, tone, movements noted in Neurologic exam below  NEUROLOGIC: MENTAL STATUS:     03/21/2020    3:50 PM  02/16/2019    2:17 PM 02/12/2018   12:09 PM  MMSE - Mini Mental State Exam  Orientation to time 5 5 4   Orientation to Place 5 5 5   Registration 3 3 3   Attention/ Calculation 5 5 5   Recall 3 3 3   Language- name 2 objects 2 2 2   Language- repeat 1 1 1   Language- follow 3 step command 3 3 3   Language- read & follow direction 1 1 1   Write a sentence 1 1 1   Copy design 1 1 1   Total score 30 30 29    awake, alert, oriented to person, place and time recent and remote memory intact normal attention and concentration language fluent, comprehension intact, naming intact fund of knowledge appropriate  CRANIAL NERVE:  2nd - no papilledema on fundoscopic exam 2nd, 3rd, 4th, 6th - pupils equal and reactive to light, visual fields full to confrontation, extraocular muscles intact, no nystagmus 5th - facial sensation symmetric 7th - facial strength symmetric 8th - hearing intact 9th - palate elevates symmetrically, uvula midline 11th - shoulder shrug symmetric 12th - tongue protrusion midline  MOTOR:  normal bulk and tone, full strength in the BUE, BLE  SENSORY:  normal and symmetric to light touch, temperature, vibration  COORDINATION:  finger-nose-finger, fine finger movements normal  REFLEXES:  deep tendon reflexes present and symmetric  GAIT/STATION:  narrow based gait     DIAGNOSTIC DATA (LABS, IMAGING, TESTING) - I reviewed Monica Collins records, labs, notes, testing and imaging myself where available.  Lab Results  Component Value Date   WBC 7.6 08/02/2021   HGB 14.6 08/02/2021   HCT 42.3 08/02/2021   MCV 92 08/02/2021   PLT 171 08/02/2021      Component Value Date/Time   NA 140 08/02/2021 1528   K 4.2 08/02/2021 1528   CL 100 08/02/2021 1528   CO2 25 08/02/2021 1528   GLUCOSE 79 08/02/2021 1528   GLUCOSE 97 05/13/2017 1951   BUN 10 08/02/2021 1528   CREATININE 0.88  08/02/2021 1528   CALCIUM 9.5 08/02/2021 1528   PROT 6.8 08/02/2021 1528   ALBUMIN 4.4  08/02/2021 1528   AST 19 08/02/2021 1528   ALT 12 08/02/2021 1528   ALKPHOS 78 08/02/2021 1528   BILITOT 0.6 08/02/2021 1528   GFRNONAA >60 05/13/2017 1951   GFRAA >60 05/13/2017 1951   No results found for: "CHOL", "HDL", "LDLCALC", "LDLDIRECT", "TRIG", "CHOLHDL" Lab Results  Component Value Date   HGBA1C 5.5 08/02/2021   No results found for: "VITAMINB12" Lab Results  Component Value Date   TSH 2.310 08/02/2021     11/09/17 MRI brain / MRA head  - Sequela of prior right MCA aneurysm clip ligation. No residual or new aneurysm  identified. - Right frontotemporal and insular encephalomalacia and gliosis. Asymmetric atrophy of  the right hippocampal complex and amygdala.    ASSESSMENT AND PLAN  77 y.o. year old female here with:  Meds tried: levetiracetam, carbamazepine  Dx:  1. 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    Dispense:  360 tablet    Refill:  4   Return in about 1 year (around 12/09/2023) for with NP.    Suanne Marker, MD 12/09/2022, 3:42 PM Certified in Neurology, Neurophysiology and Neuroimaging  [facility] [address] [place], [place] [address] [phone]

## 2022-12-26 ENCOUNTER — Other Ambulatory Visit: Payer: Self-pay | Admitting: Nurse Practitioner

## 2022-12-26 DIAGNOSIS — F411 Generalized anxiety disorder: Secondary | ICD-10-CM

## 2022-12-26 DIAGNOSIS — I700 Atherosclerosis of aorta: Secondary | ICD-10-CM

## 2022-12-27 NOTE — Telephone Encounter (Signed)
Refill orders placed.

## 2023-01-01 ENCOUNTER — Ambulatory Visit (INDEPENDENT_AMBULATORY_CARE_PROVIDER_SITE_OTHER): Payer: Medicare HMO | Admitting: Family Medicine

## 2023-01-01 ENCOUNTER — Encounter: Payer: Self-pay | Admitting: Family Medicine

## 2023-01-01 VITALS — BP 127/68 | HR 65 | Ht 59.0 in | Wt 106.1 lb

## 2023-01-01 DIAGNOSIS — F0630 Mood disorder due to known physiological condition, unspecified: Secondary | ICD-10-CM | POA: Diagnosis not present

## 2023-01-01 DIAGNOSIS — G40909 Epilepsy, unspecified, not intractable, without status epilepticus: Secondary | ICD-10-CM | POA: Diagnosis not present

## 2023-01-01 DIAGNOSIS — I10 Essential (primary) hypertension: Secondary | ICD-10-CM

## 2023-01-01 NOTE — Progress Notes (Signed)
Established Patient Office Visit  Subjective   Patient ID: Monica Collins, female    DOB: 06/05/1945  Age: 77 y.o. MRN: [id number]  Chief Complaint  Patient presents with   Medical Management of Chronic Issues    HPI Monica Collins is a 77 y.o. female presenting today for follow up of hypertension, mood. Hypertension:   Pt denies chest pain, SOB, dizziness, edema, syncope, fatigue or heart palpitations. Taking atenolol, reports excellent compliance with treatment. Denies side effects. Mood: Patient is here to follow up for depression and anxiety, currently managing with alprazolam 0.5 mg daily at bedtime, lorazepam 1 mg in the morning and 1 mg in the evening. Taking medication without side effects, reports excellent compliance with treatment. Denies mood changes or SI/HI. She feels mood is stable since last visit.     01/01/2023    2:50 PM 10/01/2022    2:42 PM 03/20/2022    1:39 PM  Depression screen PHQ 2/9  Decreased Interest 0 2 1  Down, Depressed, Hopeless 0 2 1  PHQ - 2 Score 0 4 2  Altered sleeping 0 1 1  Tired, decreased energy 0 3 1  Change in appetite 0 0 0  Feeling bad or failure about yourself  0 1 1  Trouble concentrating 1 0 1  Moving slowly or fidgety/restless 0 1 0  Suicidal thoughts 0 0 0  PHQ-9 Score 1 10 6   Difficult doing work/chores Somewhat difficult Somewhat difficult        03/20/2022    1:39 PM 11/15/2021    3:20 PM 08/02/2021    2:38 PM 03/13/2021   11:28 AM  GAD 7 : Generalized Anxiety Score  Nervous, Anxious, on Edge 1 2 2  0  Control/stop worrying 1 2 2  0  Worry too much - different things 1 2 2 1   Trouble relaxing 1 1 2 1   Restless 1 0 1 0  Easily annoyed or irritable 0 0 1 0  Afraid - awful might happen 0 1 1 0  Total GAD 7 Score 5 8 11 2    ROS Negative unless otherwise noted in HPI   Objective:     BP 127/68   Pulse 65   Ht 4\' 11"  (1.499 m)   Wt 106 lb 1.9 oz (48.1 kg)   SpO2 95%   BMI 21.43 kg/m   Physical  Exam Constitutional:      General: She is not in acute distress.    Appearance: Normal appearance.  HENT:     Head: Normocephalic and atraumatic.  Cardiovascular:     Rate and Rhythm: Normal rate and regular rhythm.     Heart sounds: No murmur heard.    No friction rub. No gallop.  Pulmonary:     Effort: Pulmonary effort is normal. No respiratory distress.     Breath sounds: No wheezing, rhonchi or rales.  Musculoskeletal:     Cervical back: Normal range of motion.  Skin:    General: Skin is warm and dry.  Neurological:     General: No focal deficit present.     Mental Status: She is alert and oriented to person, place, and time. Mental status is at baseline.  Psychiatric:        Mood and Affect: Mood normal.        Thought Content: Thought content normal.        Judgment: Judgment normal.     Assessment & Plan:  Essential hypertension Assessment & Plan: Stable.  Continue atenolol 25 mg daily.  Will continue to monitor.   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  Will continue to monitor.   Seizure disorder [facility]) Assessment & Plan: She has been working with her PCP for quite some time to gradually taper off of alprazolam.  She has been on alprazolam since 1992.  She states that now, if she misses a dose of Xanax she usually has a breakthrough seizure. 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  They have been managing with lamotrigine most recently.  Added lorazepam 0.5 mg tablet only at night for management of seizures and to assist with decreasing Xanax, PCP felt it was necessary to continue treatment with benzodiazepines due to positive effect on absence seizures. At most recent appointment with neurology on 12/09/2022, increased lamotrigine to 50 mg twice daily. Most recently, regimen of lorazepam 1 mg up  to twice daily as needed and alprazolam 0.5 mg at bedtime as needed.  Patient and chart state that benzodiazepines are for seizure activity in addition to anxiety.  I have reached out to her neurologist to discuss if there are any neurological indications for her to continue her regimen of alprazolam and lorazepam.  Patient states that ideally she would like to discontinue the alprazolam completely but feels the need to continue lorazepam for anxiety.  We discussed that there are better options that do not have habit-forming potential for anxiety.  Patient is under the impression that she would be required to go to inpatient or overnight management for detox from benzodiazepines.  Reaching out to neurology to discuss whether this is indicated or if outpatient psychiatry could manage discontinuation of benzodiazepines.     Return in about 3 months (around 04/02/2023) for follow-up.   I spent 50 minutes on the day of the encounter to include pre-visit record review, face-to-face time with the patient, and post visit coordination of care.  Melida Quitter, PA

## 2023-01-01 NOTE — Assessment & Plan Note (Signed)
Stable.  Continue atenolol 25 mg daily.  Will continue to monitor.

## 2023-01-01 NOTE — Assessment & Plan Note (Signed)
PHQ-9 score 1, significant improvement from PHQ-9 score 10 at last visit.  Will continue to monitor.

## 2023-01-01 NOTE — Assessment & Plan Note (Signed)
She has been working with her PCP for quite some time to gradually taper off of alprazolam.  She has been on alprazolam since 1992.  She states that now, if she misses a dose of Xanax she usually has a breakthrough seizure. 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  They have been managing with lamotrigine most recently.  Added lorazepam 0.5 mg tablet only at night for management of seizures and to assist with decreasing Xanax, PCP felt it was necessary to continue treatment with benzodiazepines due to positive effect on absence seizures. At most recent appointment with neurology on 12/09/2022, increased lamotrigine to 50 mg twice daily. Most recently, regimen of lorazepam 1 mg up to twice daily as needed and alprazolam 0.5 mg at bedtime as needed.  Patient and chart state that benzodiazepines are for seizure activity in addition to anxiety.  I have reached out to her neurologist to discuss if there are any neurological indications for her to continue her regimen of alprazolam and lorazepam.  Patient states that ideally she would like to discontinue the alprazolam completely but feels the need to continue lorazepam for anxiety.  We discussed that there are better options that do not have habit-forming potential for anxiety.  Patient is under the impression that she would be required to go to inpatient or overnight management for detox from benzodiazepines.  Reaching out to neurology to discuss whether this is indicated or if outpatient psychiatry could manage discontinuation of benzodiazepines.

## 2023-01-01 NOTE — Patient Instructions (Addendum)
It has been quite some time since we checked your lab work, so we can do that in the next few weeks.  Your blood pressure was beautiful when we rechecked, so we will continue with your current medicine. Continue keeping a log, and let me know if the top number is consistently over 130 or the bottom number is consistently over 80.  AT THE PHARMACY: -Shingles shot -flu shot

## 2023-01-02 ENCOUNTER — Other Ambulatory Visit: Payer: Self-pay

## 2023-01-02 DIAGNOSIS — E782 Mixed hyperlipidemia: Secondary | ICD-10-CM

## 2023-01-02 DIAGNOSIS — E611 Iron deficiency: Secondary | ICD-10-CM

## 2023-01-02 DIAGNOSIS — I10 Essential (primary) hypertension: Secondary | ICD-10-CM

## 2023-01-02 DIAGNOSIS — R5383 Other fatigue: Secondary | ICD-10-CM

## 2023-01-04 ENCOUNTER — Other Ambulatory Visit: Payer: Self-pay | Admitting: Diagnostic Neuroimaging

## 2023-01-04 DIAGNOSIS — G40909 Epilepsy, unspecified, not intractable, without status epilepticus: Secondary | ICD-10-CM

## 2023-01-07 ENCOUNTER — Other Ambulatory Visit: Payer: Self-pay | Admitting: *Deleted

## 2023-01-07 ENCOUNTER — Telehealth: Payer: Self-pay | Admitting: *Deleted

## 2023-01-07 NOTE — Telephone Encounter (Signed)
Pt daughter calling requesting patients future labs be faxed to the [facility] at [phone]. She said patient live an hour away.  She will call to confirm if these have been sent.

## 2023-01-08 ENCOUNTER — Other Ambulatory Visit: Payer: Self-pay | Admitting: Family Medicine

## 2023-01-08 DIAGNOSIS — E782 Mixed hyperlipidemia: Secondary | ICD-10-CM

## 2023-01-08 DIAGNOSIS — I10 Essential (primary) hypertension: Secondary | ICD-10-CM

## 2023-01-08 DIAGNOSIS — E611 Iron deficiency: Secondary | ICD-10-CM

## 2023-01-08 DIAGNOSIS — R5383 Other fatigue: Secondary | ICD-10-CM

## 2023-01-08 NOTE — Telephone Encounter (Signed)
Lab orders have been released and faxed.  

## 2023-01-10 ENCOUNTER — Other Ambulatory Visit: Payer: Medicare HMO

## 2023-01-14 DIAGNOSIS — E611 Iron deficiency: Secondary | ICD-10-CM | POA: Diagnosis not present

## 2023-01-14 DIAGNOSIS — E782 Mixed hyperlipidemia: Secondary | ICD-10-CM | POA: Diagnosis not present

## 2023-01-14 DIAGNOSIS — I10 Essential (primary) hypertension: Secondary | ICD-10-CM | POA: Diagnosis not present

## 2023-01-14 DIAGNOSIS — R5383 Other fatigue: Secondary | ICD-10-CM | POA: Diagnosis not present

## 2023-01-15 ENCOUNTER — Other Ambulatory Visit: Payer: Self-pay | Admitting: Family Medicine

## 2023-01-15 DIAGNOSIS — I10 Essential (primary) hypertension: Secondary | ICD-10-CM

## 2023-01-17 ENCOUNTER — Ambulatory Visit
Admission: RE | Admit: 2023-01-17 | Discharge: 2023-01-17 | Disposition: A | Discharge status: Home / Self Care | Payer: Medicare HMO | Source: Ambulatory Visit | Attending: Emergency Medicine | Admitting: Emergency Medicine

## 2023-01-17 DIAGNOSIS — Z85118 Personal history of other malignant neoplasm of bronchus and lung: Secondary | ICD-10-CM | POA: Diagnosis not present

## 2023-01-17 DIAGNOSIS — R918 Other nonspecific abnormal finding of lung field: Secondary | ICD-10-CM | POA: Diagnosis not present

## 2023-01-17 DIAGNOSIS — R911 Solitary pulmonary nodule: Secondary | ICD-10-CM | POA: Diagnosis not present

## 2023-01-17 DIAGNOSIS — I700 Atherosclerosis of aorta: Secondary | ICD-10-CM | POA: Diagnosis not present

## 2023-01-17 DIAGNOSIS — J439 Emphysema, unspecified: Secondary | ICD-10-CM | POA: Diagnosis not present

## 2023-01-21 ENCOUNTER — Ambulatory Visit: Payer: Medicare HMO | Admitting: Emergency Medicine

## 2023-01-21 ENCOUNTER — Encounter: Payer: Self-pay | Admitting: Emergency Medicine

## 2023-01-21 VITALS — BP 120/70 | HR 67 | Ht 59.0 in | Wt 105.2 lb

## 2023-01-21 DIAGNOSIS — R911 Solitary pulmonary nodule: Secondary | ICD-10-CM | POA: Diagnosis not present

## 2023-01-21 DIAGNOSIS — F17209 Nicotine dependence, unspecified, with unspecified nicotine-induced disorders: Secondary | ICD-10-CM | POA: Diagnosis not present

## 2023-01-21 DIAGNOSIS — J449 Chronic obstructive pulmonary disease, unspecified: Secondary | ICD-10-CM | POA: Diagnosis not present

## 2023-01-21 NOTE — Assessment & Plan Note (Signed)
Not limited currently.  We will hold off on starting BD therapy.  We did discuss the fact that she may need it going forward.

## 2023-01-21 NOTE — Progress Notes (Signed)
Subjective:    Patient ID: Monica Collins, female    DOB: 1945-12-20, 77 y.o.   MRN: [id number]  HPI 77 year old woman with history of tobacco use (50 pack years), resected cerebral aneurysm, CVA and seizure disorder (last seizure 04/2021), eczema.  She underwent a left lower lobe lobectomy 09/2016 for stage IIIa lung adenocarcinoma at [facility].  She now participates in lung cancer screening program.  She underwent a screening CT chest 07/11/2022 that showed a new right upper lobe pulmonary nodule as below. She has cut back her smoking, about 15 cig a day. She is very active, walks. She has intermittent cough, has a lot of allergy congestion. Occasional wheeze.   Screening CT scan of the chest 07/11/2022 reviewed by me shows no mediastinal or axillary lymphadenopathy.  Centrilobular emphysema and some scattered parenchymal scarring and changes post left lower lobe lobectomy.  There is a stable 4.5 mm right lower lobe nodule and a new 4.5 mm apical segment right upper lobe pulmonary nodule.  ROV 01/21/2023 --Sharis is 77 active smoker, seizure disorder following resected cerebral aneurysm, eczema, left lower lobe lobectomy for stage IIIa adenocarcinoma (09/2016).  I saw her after screening CT chest in 06/2022 showed a new right upper lobe pulmonary nodule.  We repeated her CT chest as below. She is doing well. She has decreased her cigarettes some. About 8 cig a day. She is active, some daily cough. No BD's   CT chest 01/17/2023 reviewed by me, shows   Review of Systems As per HPI  Past Medical History:  Diagnosis Date   Aneurysm (HCC) 10/2017   brain, [facility]   Eczema    Hypertension    Lung cancer (HCC)    Seizures (HCC)    last seizure 05/14/21   Stroke [facility])      Family History  Problem Relation Age of Onset   Cancer Sister    Cancer Brother      Social History   Socioeconomic History   Marital status: Married    Spouse name: Lynwood   Number of children: 2   Years of education: Not on  file   Highest education level: Not on file  Occupational History   Not on file  Tobacco Use   Smoking status: Some Days    Current packs/day: 0.00    Average packs/day: 1 pack/day for 50.0 years (50.0 ttl pk-yrs)    Types: Cigarettes    Start date: 10/22/1966    Last attempt to quit: 10/21/2016    Years since quitting: 6.2   Smokeless tobacco: Never   Tobacco comments:    11/20/21 pipe tobacco, 3/4 ppd ARJ 09/17/22  Vaping Use   Vaping status: Some Days  Substance and Sexual Activity   Alcohol use: No    Comment: once a year   Drug use: Never   Sexual activity: Not Currently    Birth control/protection: Post-menopausal  Other Topics Concern   Not on file  Social History Narrative   Lives with husband    Social Determinants of Health   Financial Resource Strain: Medium Risk (03/20/2022)   Overall Financial Resource Strain (CARDIA)    Difficulty of Paying Living Expenses: Somewhat hard  Food Insecurity: No Food Insecurity (03/20/2022)   Hunger Vital Sign    Worried About Running Out of Food in the Last Year: Never true    Ran Out of Food in the Last Year: Never true  Transportation Needs: No Transportation Needs (03/20/2022)   PRAPARE - Transportation  Lack of Transportation (Medical): No    Lack of Transportation (Non-Medical): No  Physical Activity: Insufficiently Active (03/20/2022)   Exercise Vital Sign    Days of Exercise per Week: 3 days    Minutes of Exercise per Session: 30 min  Stress: Stress Concern Present (03/20/2022)   [employer] of Occupational Health - Occupational Stress Questionnaire    Feeling of Stress : To some extent  Social Connections: Moderately Isolated (03/20/2022)   Social Connection and Isolation Panel [NHANES]    Frequency of Communication with Friends and Family: More than three times a week    Frequency of Social Gatherings with Friends and Family: More than three times a week    Attends Religious Services: Never    Loss adjuster, chartered or Organizations: No    Attends Banker Meetings: Never    Marital Status: Married  Catering manager Violence: Not At Risk (03/20/2022)   Humiliation, Afraid, Rape, and Kick questionnaire    Fear of Current or Ex-Partner: No    Emotionally Abused: No    Physically Abused: No    Sexually Abused: No     Allergies  Allergen Reactions   Copper    Copper-Containing Compounds    Shellfish Allergy    Latex Rash    Rubber cleaning gloves used to clean home. But less reactive in recent years Rubber cleaning gloves used to clean home. But less reactive in recent years   Nickel Rash   Soap Rash    Detergent     Outpatient Medications Prior to Visit  Medication Sig Dispense Refill   ALPRAZolam (XANAX) 0.5 MG tablet TAKE 1 TABLET BY MOUTH AT BEDTIME AS NEEDED 30 tablet 2   Ascorbic Acid (VITAMIN C PO) Take by mouth.     aspirin 81 MG chewable tablet Chew by mouth daily.     atenolol (TENORMIN) 25 MG tablet TAKE 1 TO 2 TABLETS BY MOUTH DAILY FOR HIGH BLOOD PRESSURE 180 tablet 0   clobetasol (TEMOVATE) 0.05 % external solution Apply topically 2 (two) times daily.     dicyclomine (BENTYL) 10 MG capsule Take 1 capsule (10 mg total) by mouth 3 (three) times daily as needed for spasms. 90 capsule 2   ezetimibe (ZETIA) 10 MG tablet TAKE 1 TABLET BY MOUTH EVERY DAY 90 tablet 1   lamoTRIgine (LAMICTAL) 25 MG tablet Take 2 tablets (50 mg total) by mouth 2 (two) times daily. 360 tablet 4   LORazepam (ATIVAN) 1 MG tablet TAKE 1 TABLET BY MOUTH IN THE MORNING AND MAY TAKE 1 IN THE EVENING AS NEEDED 60 tablet 2   valACYclovir (VALTREX) 500 MG tablet Take 1 tablet po bid for 7 days for fever blister 14 tablet 3   Facility-Administered Medications Prior to Visit  Medication Dose Route Frequency Provider Last Rate Last Admin   methylPREDNISolone sodium succinate (SOLU-MEDROL) 40 mg/mL injection 40 mg  40 mg Intramuscular Once Carlean Jews, NP            Objective:   Physical  Exam Vitals:   01/21/23 1424  BP: 120/70  Pulse: 67  SpO2: 92%  Weight: 105 lb 3.2 oz (47.7 kg)  Height: 4\' 11"  (1.499 m)    Gen: Pleasant, thin, in no distress,  normal affect  ENT: No lesions,  mouth clear,  oropharynx clear, no postnasal drip  Neck: No JVD, no stridor  Lungs: No use of accessory muscles, no crackles or wheezing on normal respiration, slight end expiratory  wheeze on forced expiration  Cardiovascular: RRR, heart sounds normal, no murmur or gallops, no peripheral edema  Musculoskeletal: No deformities, no cyanosis or clubbing  Neuro: alert, awake, non focal  Skin: Warm, no lesions or rash       Assessment & Plan:   Pulmonary nodule Right upper lobe pulmonary nodule looks resolved on the CT chest from 01/17/2023.  Reassured her about this.  She needs continued serial follow-up given her tobacco use and history of lung cancer.  Neck scan to be done in September 2025.  COPD (chronic obstructive pulmonary disease) (HCC) Not limited currently.  We will hold off on starting BD therapy.  We did discuss the fact that she may need it going forward.  Tobacco use disorder, continuous Discussed cessation with her today.  We talked about strategies to cut down.  She is not ready to set a quit date.    Levy Pupa, MD, PhD 01/21/2023, 2:55 PM [facility] [phone] or if no answer before 7:00PM call [phone] For any issues after 7:00PM please call eLink [phone]

## 2023-01-21 NOTE — Patient Instructions (Signed)
We reviewed your CT scan of the chest today.  Your small right upper lobe pulmonary nodule seen on your prior scan has resolved.  This is good news. We will plan to repeat your CT chest in September 2025 Continue to work on decreasing your cigarettes. Follow Dr. Delton Coombes in September 25 after your scan so we can review together

## 2023-01-21 NOTE — Assessment & Plan Note (Signed)
Right upper lobe pulmonary nodule looks resolved on the CT chest from 01/17/2023.  Reassured her about this.  She needs continued serial follow-up given her tobacco use and history of lung cancer.  Neck scan to be done in September 2025.

## 2023-01-21 NOTE — Assessment & Plan Note (Signed)
Discussed cessation with her today.  We talked about strategies to cut down.  She is not ready to set a quit date.

## 2023-02-19 ENCOUNTER — Encounter: Payer: Self-pay | Admitting: Dermatology

## 2023-02-19 ENCOUNTER — Ambulatory Visit: Payer: Medicare HMO | Admitting: Dermatology

## 2023-02-19 VITALS — BP 115/66

## 2023-02-19 DIAGNOSIS — L209 Atopic dermatitis, unspecified: Secondary | ICD-10-CM

## 2023-02-19 DIAGNOSIS — L2081 Atopic neurodermatitis: Secondary | ICD-10-CM

## 2023-02-19 DIAGNOSIS — W908XXA Exposure to other nonionizing radiation, initial encounter: Secondary | ICD-10-CM

## 2023-02-19 DIAGNOSIS — L299 Pruritus, unspecified: Secondary | ICD-10-CM

## 2023-02-19 DIAGNOSIS — Z79899 Other long term (current) drug therapy: Secondary | ICD-10-CM

## 2023-02-19 DIAGNOSIS — L578 Other skin changes due to chronic exposure to nonionizing radiation: Secondary | ICD-10-CM

## 2023-02-19 DIAGNOSIS — Z872 Personal history of diseases of the skin and subcutaneous tissue: Secondary | ICD-10-CM

## 2023-02-19 DIAGNOSIS — Z7189 Other specified counseling: Secondary | ICD-10-CM

## 2023-02-19 MED ORDER — DUPIXENT 300 MG/2ML ~~LOC~~ SOSY: 300.0000 mg | PREFILLED_SYRINGE | SUBCUTANEOUS | 6 refills | Status: DC

## 2023-02-19 MED ORDER — DUPILUMAB 300 MG/2ML ~~LOC~~ SOAJ
600.0000 mg | Freq: Once | SUBCUTANEOUS | Status: AC
Start: 2023-02-19 — End: 2023-02-19
  Administered 2023-02-19: 600 mg via SUBCUTANEOUS

## 2023-02-19 NOTE — Progress Notes (Signed)
New Patient Visit   Subjective  Monica Collins is a 77 y.o. female who presents for the following: Eczema 77m, scalp, arms, legs, itchy, Pt saw Dr. Cheree Ditto in past and tried Ketoconazole 2% shampoo d/c b/c did not see any improvement, Clobetasol sol 1x/wk pt feels like it makes scalp worse, no family hx of psoriasis The patient has spots, moles and lesions to be evaluated, some may be new or changing and the patient may have concern these could be cancer.  New patient referral from [name], [place]  The following portions of the chart were reviewed this encounter and updated as appropriate: medications, allergies, medical history  Review of Systems:  No other skin or systemic complaints except as noted in HPI or Assessment and Plan.  Objective  Well appearing patient in no apparent distress; mood and affect are within normal limits.   A focused examination was performed of the following areas: Scalp, arms, legs  Relevant exam findings are noted in the Assessment and Plan.    Assessment & Plan   ATOPIC DERMATITIS, with PRURITIS Arms, scalp, legs Exam: xerosis and scaliness arms, legs 20% BSA  Chronic and persistent condition with duration or expected duration over one year. Condition is symptomatic / bothersome to patient. Not to goal.   Atopic dermatitis (eczema) is a chronic, relapsing, pruritic condition that can significantly affect quality of life. It is often associated with allergic rhinitis and/or asthma and can require treatment with topical medications, phototherapy, or in severe cases biologic injectable medication (Dupixent; Adbry) or Oral JAK inhibitors.  Treatment Plan: Discussed Dupixent Start Dupixent 300mg  /74ml sq injections q 2 wks Dupixent 300mg /64ml sq injections x 2 today to Samples x 2 [id number] 11/2024  Recommend gentle skin care.   Dupilumab (Dupixent) is a treatment given by injection for adults and children with moderate-to-severe atopic  dermatitis. Goal is control of skin condition, not cure. It is given as 2 injections at the first dose followed by 1 injection ever 2 weeks thereafter.  Young children are dosed monthly.  Potential side effects include allergic reaction, herpes infections, injection site reactions and conjunctivitis (inflammation of the eyes).  The use of Dupixent requires long term medication management, including periodic office visits.   ACTINIC DAMAGE - chronic, secondary to cumulative UV radiation exposure/sun exposure over time - diffuse scaly erythematous macules with underlying dyspigmentation - Recommend daily broad spectrum sunscreen SPF 30+ to sun-exposed areas, reapply every 2 hours as needed.  - Recommend staying in the shade or wearing long sleeves, sun glasses (UVA+UVB protection) and wide brim hats (4-inch brim around the entire circumference of the hat). - Call for new or changing lesions.   Atopic neurodermatitis  Related Medications Dupilumab SOAJ 600 mg    HISTORY OF PRECANCEROUS ACTINIC KERATOSIS - site(s) of PreCancerous Actinic Keratosis clear today. - these may recur and new lesions may form requiring treatment to prevent transformation into skin cancer - observe for new or changing spots and contact  [facility] for appointment if occur - photoprotection with sun protective clothing; sunglasses and broad spectrum sunscreen with SPF of at least 30 + and frequent self skin exams recommended - yearly exams by a dermatologist recommended for persons with history of PreCancerous Actinic Keratoses   Return in about 2 weeks (around 03/05/2023) for Atopic Derm.  I, Monica Collins, RMA, am acting as scribe for Monica Sans, MD .   Documentation: I have reviewed the above documentation for accuracy and completeness, and I agree  with the above.  Monica Sans, MD

## 2023-02-19 NOTE — Patient Instructions (Signed)

## 2023-02-27 ENCOUNTER — Other Ambulatory Visit: Payer: Self-pay | Admitting: Family Medicine

## 2023-02-27 DIAGNOSIS — G40909 Epilepsy, unspecified, not intractable, without status epilepticus: Secondary | ICD-10-CM

## 2023-02-27 DIAGNOSIS — F411 Generalized anxiety disorder: Secondary | ICD-10-CM

## 2023-03-03 ENCOUNTER — Telehealth: Payer: Self-pay | Admitting: *Deleted

## 2023-03-03 DIAGNOSIS — I10 Essential (primary) hypertension: Secondary | ICD-10-CM

## 2023-03-03 MED ORDER — ATENOLOL 25 MG PO TABS: ORAL_TABLET | ORAL | 0 refills | Status: DC

## 2023-03-03 NOTE — Telephone Encounter (Signed)
Prescription Request  03/03/2023  LOV: 01/01/23  What is the name of the medication or equipment? atenolol (TENORMIN) 25 MG tablet   Have you contacted your pharmacy to request a refill? No   Which pharmacy would you like this sent to?  [id number] - [place], [place] - [address] [place] [address] Phone: [phone] Fax: [phone]   Patient notified that their request is being sent to the clinical staff for review and that they should receive a response within 2 business days.   Please advise at Mobile [phone] (mobile)

## 2023-03-03 NOTE — Telephone Encounter (Signed)
Refill sent.

## 2023-03-05 ENCOUNTER — Ambulatory Visit: Payer: Medicare HMO | Admitting: Dermatology

## 2023-03-05 DIAGNOSIS — Z79899 Other long term (current) drug therapy: Secondary | ICD-10-CM

## 2023-03-05 DIAGNOSIS — L209 Atopic dermatitis, unspecified: Secondary | ICD-10-CM

## 2023-03-05 DIAGNOSIS — Z7189 Other specified counseling: Secondary | ICD-10-CM

## 2023-03-05 MED ORDER — DUPILUMAB 300 MG/2ML ~~LOC~~ SOAJ
300.0000 mg | Freq: Once | SUBCUTANEOUS | Status: AC
  Administered 2023-03-05: 300 mg via SUBCUTANEOUS

## 2023-03-05 NOTE — Progress Notes (Signed)
   Follow-Up Visit   Subjective  Monica Collins is a 77 y.o. female who presents for the following: Atopic Dermatitis - pt here for her second Dupixent injection. Pt has noticed an improvement in condition since the Dupixent loading dose 2 weeks ago. Pt states no s/e from Dupixent.   The following portions of the chart were reviewed this encounter and updated as appropriate: medications, allergies, medical history  Review of Systems:  No other skin or systemic complaints except as noted in HPI or Assessment and Plan.  Objective  Well appearing patient in no apparent distress; mood and affect are within normal limits.  Areas Examined: The face, arms, and legs  Relevant physical exam findings are noted in the Assessment and Plan.    Assessment & Plan   Atopic dermatitis, unspecified type  Related Medications methylPREDNISolone sodium succinate (SOLU-MEDROL) 40 mg/mL injection 40 mg   Dupilumab SOAJ 300 mg      ATOPIC DERMATITIS Exam: Scaly pink papules coalescing to plaques 20% BSA  Chronic and persistent condition with duration or expected duration over one year. Condition is improving with treatment but not currently at goal. Atopic dermatitis (eczema) is a chronic, relapsing, pruritic condition that can significantly affect quality of life. It is often associated with allergic rhinitis and/or asthma and can require treatment with topical medications, phototherapy, or in severe cases biologic injectable medication (Dupixent; Adbry) or Oral JAK inhibitors.  Treatment Plan: Continue Dupixent 300mg /44mL SQ QOW. Dupilumab (Dupixent) is a treatment given by injection for adults and children with moderate-to-severe atopic dermatitis. Goal is control of skin condition, not cure. It is given as 2 injections at the first dose followed by 1 injection ever 2 weeks thereafter.  Young children are dosed monthly.  Potential side effects include allergic reaction, herpes infections,  injection site reactions and conjunctivitis (inflammation of the eyes).  The use of Dupixent requires long term medication management, including periodic office visits.  Dupixent 300mg /55mL pen injected SQ into the L upper arm. Patient tolerated injection well. AL, CMA  Recommend gentle skin care.   Return in about 3 months (around 06/05/2023) for follow up for atopic dermatitis; 2 weeks for nurse visit to teach Dupixent injections to daughter.  Monica Collins, CMA, am acting as scribe for [name], MD .   Documentation: I have reviewed the above documentation for accuracy and completeness, and I agree with the above.  [name], MD

## 2023-03-05 NOTE — Patient Instructions (Signed)

## 2023-03-11 ENCOUNTER — Encounter: Payer: Self-pay | Admitting: Dermatology

## 2023-03-19 ENCOUNTER — Ambulatory Visit: Payer: Medicare HMO

## 2023-03-20 DIAGNOSIS — L853 Xerosis cutis: Secondary | ICD-10-CM | POA: Diagnosis not present

## 2023-03-20 DIAGNOSIS — L2989 Other pruritus: Secondary | ICD-10-CM | POA: Diagnosis not present

## 2023-03-24 ENCOUNTER — Telehealth: Payer: Self-pay

## 2023-03-24 NOTE — Telephone Encounter (Signed)
Patient called into office this morning regarding Dupixent injections. She has hurt her foot at this time and needs to cancel follow ups. She is going to call Dupixent [employer] [facility] and express hardship due to copay over $1,000. aw

## 2023-03-25 ENCOUNTER — Ambulatory Visit: Payer: Medicare HMO

## 2023-03-25 DIAGNOSIS — Z0000 Encounter for general adult medical examination without abnormal findings: Secondary | ICD-10-CM

## 2023-03-25 NOTE — Patient Instructions (Signed)
Ms. Bacilio , Thank you for taking time to come for your Medicare Wellness Visit. I appreciate your ongoing commitment to your health goals. Please review the following plan we discussed and let me know if I can assist you in the future.   Referrals/Orders/Follow-Ups/Clinician Recommendations: none  This is a list of the screening recommended for you and due dates:  Health Maintenance  Topic Date Due   Zoster (Shingles) Vaccine (1 of 2) 11/07/1966   COVID-19 Vaccine (4 - 2023-24 season) 12/29/2022   Flu Shot  07/28/2023*   Pneumonia Vaccine (1 of 2 - PCV) 03/24/2024*   Cologuard (Stool DNA test)  01/18/2024   Medicare Annual Wellness Visit  03/24/2024   DTaP/Tdap/Td vaccine (2 - Td or Tdap) 10/10/2025   DEXA scan (bone density measurement)  Completed   Hepatitis C Screening  Completed   HPV Vaccine  Aged Out   Colon Cancer Screening  Discontinued  *Topic was postponed. The date shown is not the original due date.    Advanced directives: (ACP Link)Information on Advanced Care Planning can be found at [facility] Advance Health Care Directives Advance Health Care Directives ([email or web address]   Next Medicare Annual Wellness Visit scheduled for next year: No, schedule not open for next year  Insert Preventive Care attachment Insert FALL PREVENTION attachment if needed

## 2023-03-25 NOTE — Progress Notes (Signed)
Subjective:   Monica Collins is a 77 y.o. female who presents for Medicare Annual (Subsequent) preventive examination.  Visit Complete: Virtual I connected with  Ayesha Rumpf on 03/25/23 by a audio enabled telemedicine application and verified that I am speaking with the correct person using two identifiers.  Patient Location: Home  Provider Location: Office/Clinic  I discussed the limitations of evaluation and management by telemedicine. The patient expressed understanding and agreed to proceed.  Vital Signs: Because this visit was a virtual/telehealth visit, some criteria may be missing or patient reported. Any vitals not documented were not able to be obtained and vitals that have been documented are patient reported.    Cardiac Risk Factors include: advanced age (>58men, >57 women);dyslipidemia;hypertension     Objective:    Today's Vitals   There is no height or weight on file to calculate BMI.     03/25/2023    1:48 PM 12/02/2016    2:54 PM 11/25/2016    3:25 PM 11/21/2016    8:30 AM 11/20/2016    1:08 PM  Advanced Directives  Does Patient Have a Medical Advance Directive? No No No No No  Would patient like information on creating a medical advance directive?     Yes (MAU/Ambulatory/Procedural Areas - Information given)    Current Medications (verified) Outpatient Encounter Medications as of 03/25/2023  Medication Sig   ALPRAZolam (XANAX) 0.5 MG tablet TAKE 1 TABLET BY MOUTH AT BEDTIME AS NEEDED   Ascorbic Acid (VITAMIN C PO) Take by mouth.   aspirin 81 MG chewable tablet Chew by mouth daily.   atenolol (TENORMIN) 25 MG tablet TAKE 1 TO 2 TABLETS BY MOUTH DAILY FOR HIGH BLOOD PRESSURE   clobetasol (TEMOVATE) 0.05 % external solution Apply topically 2 (two) times daily.   ezetimibe (ZETIA) 10 MG tablet TAKE 1 TABLET BY MOUTH EVERY DAY   lamoTRIgine (LAMICTAL) 25 MG tablet Take 2 tablets (50 mg total) by mouth 2 (two) times daily.   LORazepam (ATIVAN) 1 MG tablet  Take 1 tablet (1 mg total) by mouth 2 (two) times daily as needed for anxiety.   valACYclovir (VALTREX) 500 MG tablet Take 1 tablet po bid for 7 days for fever blister   dicyclomine (BENTYL) 10 MG capsule Take 1 capsule (10 mg total) by mouth 3 (three) times daily as needed for spasms. (Patient not taking: Reported on 03/25/2023)   dupilumab (DUPIXENT) 300 MG/2ML prefilled syringe Inject 300 mg into the skin every 14 (fourteen) days. Starting at day 15 for maintenance. (Patient not taking: Reported on 03/25/2023)   ketoconazole (NIZORAL) 2 % shampoo Apply topically 3 (three) times a week. 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, [facility]   Eczema    Hypertension    Lung cancer (HCC)    Seizures (HCC)    last seizure 05/14/21   Stroke [facility])    Past Surgical History:  Procedure Laterality Date   CHOLECYSTECTOMY     LUNG CANCER SURGERY  2019   Family History  Problem Relation Age of Onset   Cancer Sister    Cancer Brother    Social History   Socioeconomic History   Marital  status: Married    Spouse name: Lynwood   Number of children: 2   Years of education: Not on file   Highest education level: Not on file  Occupational History   Not on file  Tobacco Use   Smoking status: Some Days    Current packs/day: 0.00    Average packs/day: 1 pack/day for 50.0 years (50.0 ttl pk-yrs)    Types: Cigarettes    Start date: 10/22/1966    Last attempt to quit: 10/21/2016    Years since quitting: 6.4   Smokeless tobacco: Never   Tobacco comments:    11/20/21 pipe tobacco, 3/4 ppd ARJ 09/17/22  Vaping Use   Vaping status: Some Days   Substance and Sexual Activity   Alcohol use: No    Comment: once a year   Drug use: Never   Sexual activity: Not Currently    Birth control/protection: Post-menopausal  Other Topics Concern   Not on file  Social History Narrative   Lives with husband    Social Determinants of Health   Financial Resource Strain: Low Risk  (03/25/2023)   Overall Financial Resource Strain (CARDIA)    Difficulty of Paying Living Expenses: Not hard at all  Food Insecurity: No Food Insecurity (03/25/2023)   Hunger Vital Sign    Worried About Running Out of Food in the Last Year: Never true    Ran Out of Food in the Last Year: Never true  Transportation Needs: No Transportation Needs (03/25/2023)   PRAPARE - Administrator, Civil Service (Medical): No    Lack of Transportation (Non-Medical): No  Physical Activity: Sufficiently Active (03/25/2023)   Exercise Vital Sign    Days of Exercise per Week: 7 days    Minutes of Exercise per Session: 30 min  Stress: No Stress Concern Present (03/25/2023)   [employer] of Occupational Health - Occupational Stress Questionnaire    Feeling of Stress : Not at all  Social Connections: Moderately Isolated (03/25/2023)   Social Connection and Isolation Panel [NHANES]    Frequency of Communication with Friends and Family: More than three times a week    Frequency of Social Gatherings with Friends and Family: More than three times a week    Attends Religious Services: Never    Database administrator or Organizations: No    Attends Banker Meetings: Never    Marital Status: Married    Tobacco Counseling Ready to quit: Not Answered Counseling given: Not Answered Tobacco comments: 11/20/21 pipe tobacco, 3/4 ppd ARJ 09/17/22   Clinical Intake:  Pre-visit preparation completed: Yes  Pain : No/denies pain     Nutritional Risks: Nausea/ vomitting/ diarrhea (diarrhea due to IBS) Diabetes: No  How often do you need to have someone  help you when you read instructions, pamphlets, or other written materials from your doctor or pharmacy?: 1 - Never  Interpreter Needed?: No  Information entered by :: NAllen LPN   Activities of Daily Living    03/25/2023    1:40 PM  In your present state of health, do you have any difficulty performing the following activities:  Hearing? 1  Comment no hearing aids at this time  Vision? 0  Difficulty concentrating or making decisions? 1  Comment due to stroke  Walking or climbing stairs? 0  Dressing or bathing? 0  Doing errands, shopping? 0  Preparing Food and eating ? N  Using the Toilet? N  In the past six months, have you accidently  leaked urine? N  Do you have problems with loss of bowel control? N  Managing your Medications? N  Managing your Finances? N  Housekeeping or managing your Housekeeping? N    Patient Care Team: Melida Quitter, PA as PCP - General (Family Medicine) Suanne Marker, MD as Consulting Physician (Neurology)  Indicate any recent Medical Services you may have received from other than [facility] providers in the past year (date may be approximate).     Assessment:   This is a routine wellness examination for Yitty.  Hearing/Vision screen Hearing Screening - Comments:: Thinking about getting hearing aids Vision Screening - Comments:: No regular eye exams   Goals Addressed             This Visit's Progress    Patient Stated       03/25/2023, continue doing puzzles to help with memory       Depression Screen    03/25/2023    1:51 PM 01/01/2023    2:50 PM 10/01/2022    2:42 PM 07/01/2022    3:02 PM 03/20/2022    1:39 PM 11/15/2021    3:18 PM 08/02/2021    2:37 PM  PHQ 2/9 Scores  PHQ - 2 Score 0 0 4  2 4 2   PHQ- 9 Score  1 10  6 14 9   Exception Documentation    Patient refusal       Fall Risk    03/25/2023    1:49 PM 03/20/2022    1:40 PM 03/13/2021   11:28 AM 02/06/2021   11:42 AM 10/03/2020    2:39 PM  Fall Risk   Falls in  the past year? 1 0 0 0 0  Comment tripped over husbands foot, missed a step      Number falls in past yr: 1 0 0 1 0  Injury with Fall? 0 0 0 1 0  Risk for fall due to : Medication side effect   History of fall(s);Impaired balance/gait;Impaired mobility   Follow up Falls prevention discussed;Falls evaluation completed Falls evaluation completed Falls evaluation completed Falls evaluation completed Falls evaluation completed    MEDICARE RISK AT HOME: Medicare Risk at Home Any stairs in or around the home?: Yes (has a ramp) If so, are there any without handrails?: No Home free of loose throw rugs in walkways, pet beds, electrical cords, etc?: Yes Adequate lighting in your home to reduce risk of falls?: Yes Life alert?: No Use of a cane, walker or w/c?: No Grab bars in the bathroom?: Yes Shower chair or bench in shower?: Yes Elevated toilet seat or a handicapped toilet?: Yes  TIMED UP AND GO:  Was the test performed?  No    Cognitive Function:    03/21/2020    3:50 PM 02/16/2019    2:17 PM 02/12/2018   12:09 PM  MMSE - Mini Mental State Exam  Orientation to time 5 5 4   Orientation to Place 5 5 5   Registration 3 3 3   Attention/ Calculation 5 5 5   Recall 3 3 3   Language- name 2 objects 2 2 2   Language- repeat 1 1 1   Language- follow 3 step command 3 3 3   Language- read & follow direction 1 1 1   Write a sentence 1 1 1   Copy design 1 1 1   Total score 30 30 29         03/25/2023    1:52 PM 03/20/2022    1:40 PM 01/03/2021  2:38 PM  6CIT Screen  What Year? 0 points  0 points  What month? 0 points  0 points  What time? 3 points 0 points 0 points  Count back from 20 0 points 0 points 0 points  Months in reverse 0 points 0 points 0 points  Repeat phrase 0 points 2 points 0 points  Total Score 3 points  0 points    Immunizations Immunization History  Administered Date(s) Administered   Influenza-Unspecified 02/27/2018   PFIZER(Purple Top)SARS-COV-2 Vaccination  06/18/2019, 07/09/2019, 03/13/2020   Tdap 10/11/2015   Zoster, Live 05/28/2012    TDAP status: Up to date  Flu Vaccine status: Due, Education has been provided regarding the importance of this vaccine. Advised may receive this vaccine at local pharmacy or Health Dept. Aware to provide a copy of the vaccination record if obtained from local pharmacy or Health Dept. Verbalized acceptance and understanding.  Pneumococcal vaccine status: Due, Education has been provided regarding the importance of this vaccine. Advised may receive this vaccine at local pharmacy or Health Dept. Aware to provide a copy of the vaccination record if obtained from local pharmacy or Health Dept. Verbalized acceptance and understanding.  Covid-19 vaccine status: Information provided on how to obtain vaccines.   Qualifies for Shingles Vaccine? Yes   Zostavax completed Yes   Shingrix Completed?: No.    Education has been provided regarding the importance of this vaccine. Patient has been advised to call insurance company to determine out of pocket expense if they have not yet received this vaccine. Advised may also receive vaccine at local pharmacy or Health Dept. Verbalized acceptance and understanding.  Screening Tests Health Maintenance  Topic Date Due   Zoster Vaccines- Shingrix (1 of 2) 11/07/1966   COVID-19 Vaccine (4 - 2023-24 season) 12/29/2022   INFLUENZA VACCINE  07/28/2023 (Originally 11/28/2022)   Pneumonia Vaccine 49+ Years old (1 of 2 - PCV) 03/24/2024 (Originally 11/06/1951)   Fecal DNA (Cologuard)  01/18/2024   Medicare Annual Wellness (AWV)  03/24/2024   DTaP/Tdap/Td (2 - Td or Tdap) 10/10/2025   DEXA SCAN  Completed   Hepatitis C Screening  Completed   HPV VACCINES  Aged Out   Colonoscopy  Discontinued    Health Maintenance  Health Maintenance Due  Topic Date Due   Zoster Vaccines- Shingrix (1 of 2) 11/07/1966   COVID-19 Vaccine (4 - 2023-24 season) 12/29/2022    Colorectal cancer  screening: No longer required.   Mammogram status: No longer required due to age.  Bone Density status: Completed 11/05/2010.   Lung Cancer Screening: (Low Dose CT Chest recommended if Age 53-80 years, 20 pack-year currently smoking OR have quit w/in 15years.) does not qualify.   Lung Cancer Screening Referral: no  Additional Screening:  Hepatitis C Screening: does qualify; Completed 03/21/2020  Vision Screening: Recommended annual ophthalmology exams for early detection of glaucoma and other disorders of the eye. Is the patient up to date with their annual eye exam?  No  Who is the provider or what is the name of the office in which the patient attends annual eye exams? none If pt is not established with a provider, would they like to be referred to a provider to establish care? No .   Dental Screening: Recommended annual dental exams for proper oral hygiene  Diabetic Foot Exam: n/a  Community Resource Referral / Chronic Care Management: CRR required this visit?  No   CCM required this visit?  No     Plan:  I have personally reviewed and noted the following in the patient's chart:   Medical and social history Use of alcohol, tobacco or illicit drugs  Current medications and supplements including opioid prescriptions. Patient is not currently taking opioid prescriptions. Functional ability and status Nutritional status Physical activity Advanced directives List of other physicians Hospitalizations, surgeries, and ER visits in previous 12 months Vitals Screenings to include cognitive, depression, and falls Referrals and appointments  In addition, I have reviewed and discussed with patient certain preventive protocols, quality metrics, and best practice recommendations. A written personalized care plan for preventive services as well as general preventive health recommendations were provided to patient.     Barb Merino, LPN   [id number]   After Visit Summary:  (MyChart) Due to this being a telephonic visit, the after visit summary with patients personalized plan was offered to patient via MyChart   Nurse Notes: none

## 2023-03-26 ENCOUNTER — Other Ambulatory Visit: Payer: Self-pay | Admitting: Family Medicine

## 2023-03-26 DIAGNOSIS — G40909 Epilepsy, unspecified, not intractable, without status epilepticus: Secondary | ICD-10-CM

## 2023-03-26 DIAGNOSIS — F411 Generalized anxiety disorder: Secondary | ICD-10-CM

## 2023-03-26 NOTE — Telephone Encounter (Signed)
Patient should have enough tablets to get through her upcoming appt 04/01/2023.

## 2023-03-28 DIAGNOSIS — M79671 Pain in right foot: Secondary | ICD-10-CM | POA: Diagnosis not present

## 2023-03-28 DIAGNOSIS — S93601A Unspecified sprain of right foot, initial encounter: Secondary | ICD-10-CM | POA: Diagnosis not present

## 2023-04-01 ENCOUNTER — Ambulatory Visit (INDEPENDENT_AMBULATORY_CARE_PROVIDER_SITE_OTHER): Payer: Medicare HMO | Admitting: Family Medicine

## 2023-04-01 ENCOUNTER — Encounter: Payer: Self-pay | Admitting: Family Medicine

## 2023-04-01 VITALS — BP 125/74 | HR 57 | Resp 18 | Ht 59.0 in | Wt 110.0 lb

## 2023-04-01 DIAGNOSIS — K580 Irritable bowel syndrome with diarrhea: Secondary | ICD-10-CM | POA: Diagnosis not present

## 2023-04-01 DIAGNOSIS — I10 Essential (primary) hypertension: Secondary | ICD-10-CM | POA: Diagnosis not present

## 2023-04-01 DIAGNOSIS — G40909 Epilepsy, unspecified, not intractable, without status epilepticus: Secondary | ICD-10-CM

## 2023-04-01 DIAGNOSIS — Z79899 Other long term (current) drug therapy: Secondary | ICD-10-CM | POA: Diagnosis not present

## 2023-04-01 DIAGNOSIS — F411 Generalized anxiety disorder: Secondary | ICD-10-CM | POA: Diagnosis not present

## 2023-04-01 MED ORDER — CHOLESTYRAMINE LIGHT 4 G PO PACK: 4.0000 g | PACK | Freq: Every day | ORAL | 1 refills | Status: DC

## 2023-04-01 MED ORDER — LORAZEPAM 1 MG PO TABS: 1.0000 mg | ORAL_TABLET | Freq: Two times a day (BID) | ORAL | 0 refills | Status: DC | PRN

## 2023-04-01 NOTE — Telephone Encounter (Signed)
Copied from CRM [id number]. Topic: Clinical - Medication Refill >> Mar 31, 2023  4:23 PM Cassiday T wrote: Most Recent Primary Care Visit:  Provider: Barb Merino  Department: [facility]  Visit Type: MEDICARE WELL VISIT  Date: 03/25/2023  Medication: LORazepam (ATIVAN) 1 MG tablet   Has the patient contacted their pharmacy? Yes (Agent: If no, request that the patient contact the pharmacy for the refill. If patient does not wish to contact the pharmacy document the reason why and proceed with request.) (Agent: If yes, when and what did the pharmacy advise?)  Is this the correct pharmacy for this prescription? Yes If no, delete pharmacy and type the correct one.  This is the patient's preferred pharmacy:  [facility] [address] [place], [place] - [address] [place] [address] Phone: [phone] Fax: [phone]  [id number] [place], [place] - [address] [place] [address] Phone: [phone] Fax: [phone]  [id number] [place], [place] - [address] [place] [address] Phone: [phone] Fax: [phone]  Has the prescription been filled recently? Yes  Is the patient out of the medication? Yes  Has the patient been seen for an appointment in the last year OR does the patient have an upcoming appointment? Yes  Can we respond through MyChart? No  Agent: Please be advised that Rx refills may take up to 3 business days. We ask that you follow-up with your pharmacy.

## 2023-04-01 NOTE — Progress Notes (Signed)
Established Patient Office Visit  Subjective   Patient ID: Monica Collins, female    DOB: [birth year]  Age: 77 y.o. MRN: [id number]  Chief Complaint  Patient presents with   Hypertension    HPI Monica Collins is a 77 y.o. female presenting today for follow up of hypertension, mood. Hypertension:   Pt denies chest pain, SOB, dizziness, edema, syncope, fatigue or heart palpitations. Taking atenolol, reports excellent compliance with treatment. Denies side effects.  She brought a blood pressure log from home, blood pressure between 120-128/60 8-80. Mood: Patient is here to follow up for depression and anxiety, currently managing with alprazolam 0.5 mg daily at bedtime, lorazepam 1 mg in the morning and 1 mg in the evening. Taking medication without side effects, reports excellent compliance with treatment. Denies mood changes or SI/HI. She feels mood is stable since last visit. She had been working with her previously PCP for quite some time to gradually taper off of alprazolam.  She has been on alprazolam since 1992.  She stated at her last appointment with new PCP that now, if she misses a dose of Xanax she usually has a breakthrough seizure. 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  They have been managing with lamotrigine most recently.  Added lorazepam 0.5 mg tablet only at night for management of seizures and to assist with decreasing Xanax, previous PCP felt it was necessary to continue treatment with benzodiazepines due to positive effect on absence seizures. At most recent appointment with neurology on 12/09/2022, increased lamotrigine to 50 mg twice daily.       04/01/2023    2:25 PM 03/25/2023    1:51 PM 01/01/2023    2:50 PM  Depression screen PHQ 2/9  Decreased Interest 0 0 0  Down, Depressed, Hopeless 2 0 0  PHQ - 2 Score 2 0 0  Altered sleeping 0  0  Tired, decreased energy 1  0  Change in appetite 1  0  Feeling  bad or failure about yourself  1  0  Trouble concentrating 0  1  Moving slowly or fidgety/restless 0  0  Suicidal thoughts 0  0  PHQ-9 Score 5  1  Difficult doing work/chores Somewhat difficult  Somewhat difficult      04/01/2023    2:26 PM 03/20/2022    1:39 PM 11/15/2021    3:20 PM 08/02/2021    2:38 PM  GAD 7 : Generalized Anxiety Score  Nervous, Anxious, on Edge 1 1 2 2   Control/stop worrying 1 1 2 2   Worry too much - different things 1 1 2 2   Trouble relaxing 1 1 1 2   Restless 0 1 0 1  Easily annoyed or irritable 1 0 0 1  Afraid - awful might happen 0 0 1 1  Total GAD 7 Score 5 5 8 11   Anxiety Difficulty Somewhat difficult         Outpatient Medications Prior to Visit  Medication Sig   ALPRAZolam (XANAX) 0.5 MG tablet TAKE 1 TABLET BY MOUTH AT BEDTIME AS NEEDED   Ascorbic Acid (VITAMIN C PO) Take by mouth.   aspirin 81 MG chewable tablet Chew by mouth daily.   atenolol (TENORMIN) 25 MG tablet TAKE 1 TO 2 TABLETS BY MOUTH DAILY FOR HIGH BLOOD PRESSURE   clobetasol (TEMOVATE) 0.05 % external solution Apply topically 2 (two) times daily.   ketoconazole (NIZORAL) 2 % shampoo Apply topically 3 (three)  times a week.   lamoTRIgine (LAMICTAL) 25 MG tablet Take 2 tablets (50 mg total) by mouth 2 (two) times daily.   valACYclovir (VALTREX) 500 MG tablet Take 1 tablet po bid for 7 days for fever blister   [DISCONTINUED] LORazepam (ATIVAN) 1 MG tablet Take 1 tablet (1 mg total) by mouth 2 (two) times daily as needed for anxiety.   [DISCONTINUED] dicyclomine (BENTYL) 10 MG capsule Take 1 capsule (10 mg total) by mouth 3 (three) times daily as needed for spasms. (Patient not taking: Reported on 03/25/2023)   [DISCONTINUED] dupilumab (DUPIXENT) 300 MG/2ML prefilled syringe Inject 300 mg into the skin every 14 (fourteen) days. Starting at day 15 for maintenance. (Patient not taking: Reported on 03/25/2023)   [DISCONTINUED] ezetimibe (ZETIA) 10 MG tablet TAKE 1 TABLET BY MOUTH EVERY DAY  (Patient not taking: Reported on 04/01/2023)   Facility-Administered Medications Prior to Visit  Medication Dose Route Frequency Provider   methylPREDNISolone sodium succinate (SOLU-MEDROL) 40 mg/mL injection 40 mg  40 mg Intramuscular Once Boscia, Heather E, NP    ROS Negative unless otherwise noted in HPI   Objective:     BP 125/74 Comment: home BP  Pulse (!) 57   Resp 18   Ht 4\' 11"  (1.499 m)   Wt 110 lb (49.9 kg)   SpO2 99%   BMI 22.22 kg/m   Physical Exam Constitutional:      General: She is not in acute distress.    Appearance: Normal appearance.  HENT:     Head: Normocephalic and atraumatic.  Cardiovascular:     Rate and Rhythm: Normal rate and regular rhythm.     Heart sounds: No murmur heard.    No friction rub. No gallop.  Pulmonary:     Effort: Pulmonary effort is normal. No respiratory distress.     Breath sounds: No wheezing, rhonchi or rales.  Skin:    General: Skin is warm and dry.  Neurological:     Mental Status: She is alert and oriented to person, place, and time.     Assessment & Plan:  Essential hypertension Assessment & Plan: BP goal <130/80. Stable, at goal.  Continue atenolol 25 mg daily.  Will continue to monitor.   Anxiety, generalized Assessment & Plan: Contacted patient's neurologist to clarify if inpatient/overnight management would be necessary and recommended for benzodiazepine taper. Per Suanne Marker, MD: "Ok to wean off, but slow taper is preferred. The only risk is for withdrawal seizure. Also patient does seem reluctant to wean off, so might be better supervised via psychiatry."  Referral placed today for further management of anxiety and benzodiazepine taper.  Orders: -     Ambulatory referral to Psychiatry -     LORazepam; Take 1 tablet (1 mg total) by mouth 2 (two) times daily as needed for anxiety.  Dispense: 60 tablet; Refill: 0  Long-term current use of benzodiazepine -     Ambulatory referral to  Psychiatry  Seizure disorder [facility]) Assessment & Plan: Continue routine follow-up with neurology and lamotrigine 50 mg twice daily.  Referral has been placed to psychiatry for management of benzodiazepine taper and anxiety.  Patient verbalized understanding and is agreeable to this plan.  Orders: -     LORazepam; Take 1 tablet (1 mg total) by mouth 2 (two) times daily as needed for anxiety.  Dispense: 60 tablet; Refill: 0  Irritable bowel syndrome with diarrhea Assessment & Plan: Trial of cholestyramine 4 mg daily as patient is s/p cholecystectomy with  chronic diarrhea.  This will also be beneficial for hyperlipidemia.  Orders: -     Cholestyramine Light; Take 1 packet (4 g total) by mouth daily. May increase to 1 packet 2 times daily after 7 days.  Dispense: 60 packet; Refill: 1    Return in about 3 months (around 06/30/2023) for follow-up for IBS, new medicine.    Melida Quitter, PA

## 2023-04-01 NOTE — Assessment & Plan Note (Signed)
BP goal <130/80. Stable, at goal.  Continue atenolol 25 mg daily.  Will continue to monitor.

## 2023-04-01 NOTE — Assessment & Plan Note (Signed)
Contacted patient's neurologist to clarify if inpatient/overnight management would be necessary and recommended for benzodiazepine taper. Per Suanne Marker, MD: "Ok to wean off, but slow taper is preferred. The only risk is for withdrawal seizure. Also patient does seem reluctant to wean off, so might be better supervised via psychiatry."  Referral placed today for further management of anxiety and benzodiazepine taper.

## 2023-04-01 NOTE — Assessment & Plan Note (Signed)
Trial of cholestyramine 4 mg daily as patient is s/p cholecystectomy with chronic diarrhea.  This will also be beneficial for hyperlipidemia.

## 2023-04-01 NOTE — Assessment & Plan Note (Signed)
Continue routine follow-up with neurology and lamotrigine 50 mg twice daily.  Referral has been placed to psychiatry for management of benzodiazepine taper and anxiety.  Patient verbalized understanding and is agreeable to this plan.

## 2023-04-01 NOTE — Telephone Encounter (Signed)
Patient has an appointment today, will discuss prescription and possible benzodiazepine taper.

## 2023-04-03 ENCOUNTER — Ambulatory Visit: Payer: Medicare HMO | Admitting: Family Medicine

## 2023-04-23 ENCOUNTER — Other Ambulatory Visit: Payer: Self-pay | Admitting: Family Medicine

## 2023-04-23 DIAGNOSIS — K580 Irritable bowel syndrome with diarrhea: Secondary | ICD-10-CM

## 2023-04-27 ENCOUNTER — Other Ambulatory Visit: Payer: Self-pay | Admitting: Family Medicine

## 2023-04-27 DIAGNOSIS — F411 Generalized anxiety disorder: Secondary | ICD-10-CM

## 2023-04-27 DIAGNOSIS — G40909 Epilepsy, unspecified, not intractable, without status epilepticus: Secondary | ICD-10-CM

## 2023-05-14 ENCOUNTER — Telehealth: Payer: Self-pay | Admitting: *Deleted

## 2023-05-14 NOTE — Telephone Encounter (Signed)
 Copied from CRM [id number]. Topic: Referral - Question >> May 14, 2023  1:41 PM Carlatta H wrote: Reason for CRM: Patient would a referral to doctor that's closer//Please call patient

## 2023-05-14 NOTE — Telephone Encounter (Signed)
 LVM for pt to call back to ask about below.  It did not specify which referral she was talking about.

## 2023-05-15 ENCOUNTER — Telehealth: Payer: Self-pay

## 2023-05-15 NOTE — Telephone Encounter (Signed)
Message sent to referral coordinator to have this changed

## 2023-05-15 NOTE — Telephone Encounter (Signed)
Copied from CRM [id number]. Topic: Referral - Question >> May 14, 2023  4:49 PM Antony Haste wrote: Reason for CRM: This patient is requesting to have a new referral sent for Psychiatry/Therapy due to the previous facility being too far from her residence. She would like to determine if there's any options closer to her home address or in the [place] area.

## 2023-05-15 NOTE — Telephone Encounter (Signed)
Can you please change her psychiatry referral to [facility]?

## 2023-05-15 NOTE — Addendum Note (Signed)
Addended by: Lamonte Sakai, Jowan Skillin D on: 05/15/2023 04:54 PM   Modules accepted: Orders

## 2023-05-16 NOTE — Telephone Encounter (Signed)
This has been changed 

## 2023-05-26 ENCOUNTER — Other Ambulatory Visit: Payer: Self-pay | Admitting: Family Medicine

## 2023-05-26 ENCOUNTER — Other Ambulatory Visit: Payer: Self-pay | Admitting: Diagnostic Neuroimaging

## 2023-05-26 ENCOUNTER — Telehealth: Payer: Self-pay

## 2023-05-26 DIAGNOSIS — G40909 Epilepsy, unspecified, not intractable, without status epilepticus: Secondary | ICD-10-CM

## 2023-05-26 DIAGNOSIS — F411 Generalized anxiety disorder: Secondary | ICD-10-CM

## 2023-05-26 MED ORDER — LORAZEPAM 1 MG PO TABS: 1.0000 mg | ORAL_TABLET | Freq: Two times a day (BID) | ORAL | 0 refills | Status: DC | PRN

## 2023-05-26 NOTE — Telephone Encounter (Signed)
Called pt she stated that she has the number and she would try giving them call again

## 2023-05-26 NOTE — Telephone Encounter (Unsigned)
Copied from CRM [id number]. Topic: Clinical - Medication Refill >> May 26, 2023 10:06 AM Fuller Canada P wrote: Most Recent Primary Care Visit:  Provider: [name] A  Department: [facility]  Visit Type: FOLLOW UP 20  Date: 04/01/2023  Medication: LORazepam (ATIVAN) 1 MG tablet  Has the patient contacted their pharmacy? Yes (Agent: If no, request that the patient contact the pharmacy for the refill. If patient does not wish to contact the pharmacy document the reason why and proceed with request.) (Agent: If yes, when and what did the pharmacy advise?)  Is this the correct pharmacy for this prescription? Yes If no, delete pharmacy and type the correct one.  This is the patient's preferred pharmacy:  [id number] - [place], [place] - [address] [place] [address] Phone: [phone] Fax: [phone]    Has the prescription been filled recently? No  Is the patient out of the medication? Yes  Has the patient been seen for an appointment in the last year OR does the patient have an upcoming appointment? Yes  Can we respond through MyChart? Yes  Agent: Please be advised that Rx refills may take up to 3 business days. We ask that you follow-up with your pharmacy.

## 2023-05-26 NOTE — Telephone Encounter (Signed)
Copied from CRM [id number]. Topic: Referral - Question >> May 26, 2023 10:11 AM Ivette P wrote: Reason for CRM: Pt is requesting to go to another behavioral clinic and would like a call to get some questions answered, callback number [phone]

## 2023-05-26 NOTE — Telephone Encounter (Signed)
Last seen on 12/09/22 per note " Take 2 tablets (50 mg total) by mouth 2 (two) times daily. " Follow up scheduled on 12/15/23

## 2023-05-26 NOTE — Telephone Encounter (Signed)
Already sent.

## 2023-05-26 NOTE — Telephone Encounter (Signed)
Please clarify with her, we did change the referral that was initially too far from her house to be closer at [facility].  We made this change on 05/15/2023.  If she has not heard from them yet, she can call [phone].  If she does have any additional questions for me, she can send me a message on MyChart.

## 2023-05-26 NOTE — Telephone Encounter (Signed)
Copied from CRM [id number]. Topic: Clinical - Medication Refill >> May 26, 2023 10:06 AM Fuller Canada P wrote: Most Recent Primary Care Visit:  Provider: [name] A  Department: [facility]  Visit Type: FOLLOW UP 20  Date: 04/01/2023  Medication: LORazepam (ATIVAN) 1 MG tablet  Has the patient contacted their pharmacy? Yes (Agent: If no, request that the patient contact the pharmacy for the refill. If patient does not wish to contact the pharmacy document the reason why and proceed with request.) (Agent: If yes, when and what did the pharmacy advise?)  Is this the correct pharmacy for this prescription? Yes If no, delete pharmacy and type the correct one.  This is the patient's preferred pharmacy:  [id number] - [place], [place] - [address] [place] [address] Phone: [phone] Fax: [phone]    Has the prescription been filled recently? No  Is the patient out of the medication? Yes  Has the patient been seen for an appointment in the last year OR does the patient have an upcoming appointment? Yes  Can we respond through MyChart? Yes  Agent: Please be advised that Rx refills may take up to 3 business days. We ask that you follow-up with your pharmacy.

## 2023-05-26 NOTE — Telephone Encounter (Signed)
Called pt LVM to contact office

## 2023-05-28 ENCOUNTER — Ambulatory Visit: Payer: Medicare HMO | Admitting: Dermatology

## 2023-06-05 ENCOUNTER — Encounter: Payer: Self-pay | Admitting: Family Medicine

## 2023-06-16 ENCOUNTER — Other Ambulatory Visit: Payer: Self-pay | Admitting: Family Medicine

## 2023-06-16 DIAGNOSIS — F411 Generalized anxiety disorder: Secondary | ICD-10-CM

## 2023-06-16 NOTE — Telephone Encounter (Signed)
Copied from CRM [id number]. Topic: Clinical - Medication Refill >> Jun 16, 2023 11:35 AM Fuller Mandril wrote: Most Recent Primary Care Visit:  Provider: [name] A  Department: [facility]  Visit Type: FOLLOW UP 20  Date: 04/01/2023  Medication: ALPRAZolam (XANAX) 0.5 MG tablet  Has the patient contacted their pharmacy? Yes (Agent: If no, request that the patient contact the pharmacy for the refill. If patient does not wish to contact the pharmacy document the reason why and proceed with request.) (Agent: If yes, when and what did the pharmacy advise?) Requested no response  Is this the correct pharmacy for this prescription? Yes If no, delete pharmacy and type the correct one.  This is the patient's preferred pharmacy:  [id number] [place], [place] - [address] [place] [address] Phone: [phone] Fax: [phone]   Has the prescription been filled recently? N/A  Is the patient out of the medication? Yes  Has the patient been seen for an appointment in the last year OR does the patient have an upcoming appointment? Yes  Can we respond through MyChart? Yes  Agent: Please be advised that Rx refills may take up to 3 business days. We ask that you follow-up with your pharmacy.

## 2023-06-23 ENCOUNTER — Other Ambulatory Visit: Payer: Self-pay | Admitting: Family Medicine

## 2023-06-23 DIAGNOSIS — G40909 Epilepsy, unspecified, not intractable, without status epilepticus: Secondary | ICD-10-CM

## 2023-06-23 DIAGNOSIS — F411 Generalized anxiety disorder: Secondary | ICD-10-CM

## 2023-06-23 MED ORDER — LORAZEPAM 1 MG PO TABS: 1.0000 mg | ORAL_TABLET | Freq: Two times a day (BID) | ORAL | 0 refills | Status: DC | PRN

## 2023-06-23 NOTE — Telephone Encounter (Signed)
 Lorazepam refill already sent by Dr. Constance Goltz earlier today

## 2023-06-23 NOTE — Telephone Encounter (Signed)
 Copied from CRM [id number]. Topic: Clinical - Medication Refill >> Jun 23, 2023  3:46 PM Thomes Dinning wrote: Most Recent Primary Care Visit:  Provider: [name] A  Department: [facility]  Visit Type: FOLLOW UP 20  Date: 04/01/2023  Medication: LORazepam (ATIVAN) 1 MG tablet  Has the patient contacted their pharmacy? Yes (Agent: If no, request that the patient contact the pharmacy for the refill. If patient does not wish to contact the pharmacy document the reason why and proceed with request.) (Agent: If yes, when and what did the pharmacy advise?)  Is this the correct pharmacy for this prescription? Yes If no, delete pharmacy and type the correct one.  This is the patient's preferred pharmacy:  [id number] - [place], [place] - [address] [place] [address] Phone: [phone] Fax: [phone]  Has the prescription been filled recently? Yes  Is the patient out of the medication? Yes  Has the patient been seen for an appointment in the last year OR does the patient have an upcoming appointment? Yes  Can we respond through MyChart? Yes  Agent: Please be advised that Rx refills may take up to 3 business days. We ask that you follow-up with your pharmacy.

## 2023-06-23 NOTE — Telephone Encounter (Signed)
 Last Fill: 05/26/23 60 tabs/0 refills  Last OV: 04/01/23 Next OV: 06/30/23  Routing to provider for review/authorization.

## 2023-06-26 ENCOUNTER — Other Ambulatory Visit: Payer: Self-pay | Admitting: Family Medicine

## 2023-06-26 DIAGNOSIS — I700 Atherosclerosis of aorta: Secondary | ICD-10-CM

## 2023-06-30 ENCOUNTER — Ambulatory Visit (INDEPENDENT_AMBULATORY_CARE_PROVIDER_SITE_OTHER): Payer: Medicare HMO | Admitting: Family Medicine

## 2023-06-30 ENCOUNTER — Encounter: Payer: Self-pay | Admitting: Family Medicine

## 2023-06-30 VITALS — BP 138/70 | HR 62 | Ht 59.0 in | Wt 108.0 lb

## 2023-06-30 DIAGNOSIS — F411 Generalized anxiety disorder: Secondary | ICD-10-CM

## 2023-06-30 DIAGNOSIS — K580 Irritable bowel syndrome with diarrhea: Secondary | ICD-10-CM

## 2023-06-30 DIAGNOSIS — G40909 Epilepsy, unspecified, not intractable, without status epilepticus: Secondary | ICD-10-CM | POA: Diagnosis not present

## 2023-06-30 MED ORDER — LORAZEPAM 1 MG PO TABS: 1.0000 mg | ORAL_TABLET | Freq: Two times a day (BID) | ORAL | 1 refills | Status: DC | PRN

## 2023-06-30 NOTE — Progress Notes (Signed)
 Established Patient Office Visit  Subjective   Patient ID: Monica Collins, female    DOB: 03-31-1946  Age: 78 y.o. MRN: [id number]  Chief Complaint  Patient presents with   Medication Follow Up for IBS    HPI Monica Collins is a 78 y.o. female presenting today for follow up of IBS.  At previous appointment, initiated trial of cholestyramine 4 mg daily hoping to improve chronic diarrhea as patient is s/p cholecystectomy with the added benefit of potentially improving hyperlipidemia.  She reports that she has experienced some improvement her symptoms of diarrhea.  She notes that a full 4 mg dose caused abdominal pain, so she is only been doing about one quarter of a packet (approximately 1 mg) daily.  This has decreased the frequency of bowel movements and improved the consistency of stools.  She states that it is still "mushy" but it is no longer watery.  She is interested in a referral to gastroenterology for further evaluation and discussion as she has not seen a specialist in the past.  She also reports that she has not been called by  [facility] to schedule an appointment.  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  Her biggest priority is making sure that she is safe and does not have any resulting seizures.  Outpatient Medications Prior to Visit  Medication Sig   ALPRAZolam (XANAX) 0.5 MG tablet TAKE 1 TABLET BY MOUTH EVERY DAY AT BEDTIME AS NEEDED   Ascorbic Acid (VITAMIN C PO) Take by mouth.   aspirin 81 MG chewable tablet Chew by mouth daily.   atenolol (TENORMIN) 25 MG tablet TAKE 1 TO 2 TABLETS BY MOUTH DAILY FOR HIGH BLOOD PRESSURE   cholestyramine light (PREVALITE) 4 g packet TAKE 1 PACKET (4 G TOTAL) BY MOUTH DAILY. MAY INCREASE TO 1 PACKET 2 TIMES DAILY AFTER 7 DAYS.   clobetasol (TEMOVATE) 0.05 % external solution Apply topically 2 (two) times daily.   ezetimibe (ZETIA) 10 MG tablet TAKE 1 TABLET BY  MOUTH EVERY DAY   ketoconazole (NIZORAL) 2 % shampoo Apply topically 3 (three) times a week.   lamoTRIgine (LAMICTAL) 25 MG tablet Take 2 tablets (50 mg total) by mouth 2 (two) times daily.   valACYclovir (VALTREX) 500 MG tablet Take 1 tablet po bid for 7 days for fever blister   [DISCONTINUED] LORazepam (ATIVAN) 1 MG tablet Take 1 tablet (1 mg total) by mouth 2 (two) times daily as needed for anxiety.   Facility-Administered Medications Prior to Visit  Medication Dose Route Frequency Provider   methylPREDNISolone sodium succinate (SOLU-MEDROL) 40 mg/mL injection 40 mg  40 mg Intramuscular Once Boscia, Heather E, NP    ROS Negative unless otherwise noted in HPI   Objective:     BP 138/70   Pulse 62   Ht 4\' 11"  (1.499 m)   Wt 108 lb (49 kg)   SpO2 97%   BMI 21.81 kg/m   Physical Exam Constitutional:      General: She is not in acute distress.    Appearance: Normal appearance.  HENT:     Head: Normocephalic and atraumatic.  Cardiovascular:     Rate and Rhythm: Normal rate and regular rhythm.     Heart sounds: No murmur heard.    No friction rub. No gallop.  Pulmonary:     Effort: Pulmonary effort is normal. No respiratory distress.  Breath sounds: No wheezing, rhonchi or rales.  Musculoskeletal:     Cervical back: Normal range of motion.  Skin:    General: Skin is warm and dry.  Neurological:     General: No focal deficit present.     Mental Status: She is alert and oriented to person, place, and time. Mental status is at baseline.  Psychiatric:        Mood and Affect: Mood normal.        Thought Content: Thought content normal.        Judgment: Judgment normal.      Assessment & Plan:  Irritable bowel syndrome with diarrhea Assessment & Plan: Continue cholestyramine up to 4 mg daily for chronic diarrhea s/p cholecystectomy.  Continue to monitor symptoms.  Referral has been placed to gastroenterology as well.  Orders: -     Ambulatory referral to  Gastroenterology  Anxiety, generalized Assessment & Plan: She has been on alprazolam since 1992. She was working with her previous PCP for quite some time to gradually taper off of alprazolam. 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  They have been managing with lamotrigine and added lorazepam 0.5 mg tablet only at night for management of seizures and to assist with decreasing Xanax. At most recent appointment with neurology on 12/09/2022, increased lamotrigine to 50 mg twice daily.  PCP contacted patient's neurologist to clarify if inpatient/overnight management would be necessary and recommended for benzodiazepine taper. Per Suanne Marker, MD: "Ok to wean off, but slow taper is preferred. The only risk is for withdrawal seizure. Also patient does seem reluctant to wean off, so might be better supervised via psychiatry."  Referral placed today for further management of anxiety and benzodiazepine taper.  Will need to coordinate care with neurology given history of seizures.  Orders: -     Ambulatory referral to Psychiatry -     LORazepam; Take 1 tablet (1 mg total) by mouth 2 (two) times daily as needed for anxiety.  Dispense: 60 tablet; Refill: 1  Seizure disorder (HCC) -     LORazepam; Take 1 tablet (1 mg total) by mouth 2 (two) times daily as needed for anxiety.  Dispense: 60 tablet; Refill: 1    Return in about 3 months (around 09/30/2023) for follow-up for HTN, HLD, Xanax taper with psychiatry, GI appt, fasting labs 1 week before.    Melida Quitter, PA

## 2023-06-30 NOTE — Assessment & Plan Note (Signed)
 Continue cholestyramine up to 4 mg daily for chronic diarrhea s/p cholecystectomy.  Continue to monitor symptoms.  Referral has been placed to gastroenterology as well.

## 2023-06-30 NOTE — Assessment & Plan Note (Signed)
 She has been on alprazolam since 1992. She was working with her previous PCP for quite some time to gradually taper off of alprazolam. 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  They have been managing with lamotrigine and added lorazepam 0.5 mg tablet only at night for management of seizures and to assist with decreasing Xanax. At most recent appointment with neurology on 12/09/2022, increased lamotrigine to 50 mg twice daily.  PCP contacted patient's neurologist to clarify if inpatient/overnight management would be necessary and recommended for benzodiazepine taper. Per Suanne Marker, MD: "Ok to wean off, but slow taper is preferred. The only risk is for withdrawal seizure. Also patient does seem reluctant to wean off, so might be better supervised via psychiatry."  Referral placed today for further management of anxiety and benzodiazepine taper.  Will need to coordinate care with neurology given history of seizures.

## 2023-07-01 ENCOUNTER — Ambulatory Visit: Payer: Medicare HMO | Admitting: Dermatology

## 2023-07-11 ENCOUNTER — Other Ambulatory Visit: Payer: Self-pay | Admitting: Family Medicine

## 2023-07-11 DIAGNOSIS — I700 Atherosclerosis of aorta: Secondary | ICD-10-CM

## 2023-07-11 NOTE — Telephone Encounter (Signed)
 Copied from CRM [id number]. Topic: Clinical - Medication Refill >> Jul 11, 2023  2:41 PM Shelah Lewandowsky wrote: Most Recent Primary Care Visit:  Provider: [name] A  Department: [facility]  Visit Type: OFFICE VISIT  Date: 06/30/2023  Medication: ezetimibe (ZETIA) 10 MG tablet  Has the patient contacted their pharmacy? yes (Agent: If no, request that the patient contact the pharmacy for the refill. If patient does not wish to contact the pharmacy document the reason why and proceed with request.) (Agent: If yes, when and what did the pharmacy advise?)  Is this the correct pharmacy for this prescription? Yes If no, delete pharmacy and type the correct one.  This is the patient's preferred pharmacy:  [id number] - [place], [place] - [address] [place] [address] Phone: [phone] Fax: [phone]    Has the prescription been filled recently? Yes  Is the patient out of the medication? Yes  Has the patient been seen for an appointment in the last year OR does the patient have an upcoming appointment? Yes  Can we respond through MyChart? Yes  Agent: Please be advised that Rx refills may take up to 3 business days. We ask that you follow-up with your pharmacy.

## 2023-07-11 NOTE — Telephone Encounter (Signed)
 Last Fill: 06/26/23   Last OV: 06/30/23 Next OV: None Scheduled  Routing to provider for review/authorization.

## 2023-07-14 NOTE — Telephone Encounter (Signed)
 Contacted pt and confirmed that she was able to get her medication.

## 2023-07-31 ENCOUNTER — Ambulatory Visit: Admitting: Dermatology
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
## 2023-08-21 ENCOUNTER — Ambulatory Visit (INDEPENDENT_AMBULATORY_CARE_PROVIDER_SITE_OTHER): Admitting: Psychiatry

## 2023-08-21 ENCOUNTER — Other Ambulatory Visit: Payer: Self-pay

## 2023-08-21 ENCOUNTER — Encounter: Payer: Self-pay | Admitting: Psychiatry

## 2023-08-21 VITALS — BP 148/81 | HR 60 | Temp 97.3°F | Ht 59.0 in | Wt 109.8 lb

## 2023-08-21 DIAGNOSIS — Z79899 Other long term (current) drug therapy: Secondary | ICD-10-CM | POA: Diagnosis not present

## 2023-08-21 DIAGNOSIS — F4321 Adjustment disorder with depressed mood: Secondary | ICD-10-CM

## 2023-08-21 NOTE — Patient Instructions (Signed)
 Follow up as needed Please continue your care with your primary care provider and neurologist.

## 2023-09-01 ENCOUNTER — Other Ambulatory Visit: Payer: Self-pay | Admitting: Family Medicine

## 2023-09-01 DIAGNOSIS — I10 Essential (primary) hypertension: Secondary | ICD-10-CM

## 2023-09-09 ENCOUNTER — Other Ambulatory Visit: Payer: Self-pay | Admitting: Family Medicine

## 2023-09-09 DIAGNOSIS — F411 Generalized anxiety disorder: Secondary | ICD-10-CM

## 2023-09-09 MED ORDER — ALPRAZOLAM 0.5 MG PO TABS
ORAL_TABLET | ORAL | 2 refills | Status: DC
Start: 2023-09-09 — End: 2023-12-03

## 2023-09-09 NOTE — Telephone Encounter (Signed)
 Copied from CRM [id number]. Topic: Clinical - Medication Refill >> Sep 09, 2023  2:50 PM Monica Collins wrote: Medication: ALPRAZolam  (XANAX ) 0.5 MG tablet    This is the patient's preferred pharmacy:  [id number] - [place], [place] - [address] [place] [address] Phone: [phone] Fax: [phone]   Is this the correct pharmacy for this prescription? Yes If no, delete pharmacy and type the correct one.   Has the prescription been filled recently? No. Last refilled on 06/16/23  Is the patient out of the medication? Yes  Has the patient been seen for an appointment in the last year OR does the patient have an upcoming appointment? Yes. [place] was on 06/30/23  Can we respond through MyChart? No. Contact patient by phone [phone]  Agent: Please be advised that Rx refills may take up to 3 business days. We ask that you follow-up with your pharmacy.

## 2023-09-15 ENCOUNTER — Other Ambulatory Visit: Payer: Self-pay | Admitting: Family Medicine

## 2023-09-15 DIAGNOSIS — F411 Generalized anxiety disorder: Secondary | ICD-10-CM

## 2023-09-15 DIAGNOSIS — G40909 Epilepsy, unspecified, not intractable, without status epilepticus: Secondary | ICD-10-CM

## 2023-09-15 MED ORDER — LORAZEPAM 1 MG PO TABS
1.0000 mg | ORAL_TABLET | Freq: Two times a day (BID) | ORAL | 1 refills | Status: DC | PRN
Start: 2023-09-15 — End: 2023-11-11

## 2023-09-15 NOTE — Telephone Encounter (Signed)
 Copied from CRM [id number]. Topic: Clinical - Medication Refill >> Sep 15, 2023 12:25 PM Fredrica W wrote: Medication: LORazepam  (ATIVAN ) 1 MG tablet  Has the patient contacted their pharmacy? Yes (Agent: If no, request that the patient contact the pharmacy for the refill. If patient does not wish to contact the pharmacy document the reason why and proceed with request.) (Agent: If yes, when and what did the pharmacy advise?) no refills  This is the patient's preferred pharmacy:  [id number] - [place], [place] - [address] [place] [address] Phone: [phone] Fax: [phone]   Is this the correct pharmacy for this prescription? Yes If no, delete pharmacy and type the correct one.   Has the prescription been filled recently? Yes  Is the patient out of the medication? Yes  Has the patient been seen for an appointment in the last year OR does the patient have an upcoming appointment? Yes  Can we respond through MyChart? Yes  Agent: Please be advised that Rx refills may take up to 3 business days. We ask that you follow-up with your pharmacy.

## 2023-09-24 ENCOUNTER — Other Ambulatory Visit: Payer: Self-pay | Admitting: *Deleted

## 2023-09-24 DIAGNOSIS — I10 Essential (primary) hypertension: Secondary | ICD-10-CM

## 2023-09-24 DIAGNOSIS — Z131 Encounter for screening for diabetes mellitus: Secondary | ICD-10-CM

## 2023-09-24 DIAGNOSIS — E782 Mixed hyperlipidemia: Secondary | ICD-10-CM

## 2023-09-24 DIAGNOSIS — E559 Vitamin D deficiency, unspecified: Secondary | ICD-10-CM

## 2023-10-07 ENCOUNTER — Telehealth: Payer: Self-pay

## 2023-10-07 NOTE — Telephone Encounter (Signed)
 Call to verify that pt went to get labs drawn at [facility] for her appt 10/14/23

## 2023-10-08 ENCOUNTER — Telehealth: Payer: Self-pay

## 2023-10-08 NOTE — Telephone Encounter (Signed)
 ok

## 2023-10-08 NOTE — Telephone Encounter (Signed)
 Copied from CRM [id number]. Topic: Appointments - Appointment Scheduling >> Oct 08, 2023 12:57 PM Sophia H wrote:  patient is calling in regarding a message she received about lab orders needing to be scheduled for her upcoming appointment. I am not able to schedule those labs on my end for some reason. Please reach out to the patient via phone - [phone]

## 2023-10-08 NOTE — Telephone Encounter (Signed)
Called pt back regarding her labs

## 2023-10-09 ENCOUNTER — Ambulatory Visit: Payer: Self-pay

## 2023-10-09 LAB — COMPREHENSIVE METABOLIC PANEL WITH GFR
ALT: 16 IU/L (ref 0–32)
AST: 21 IU/L (ref 0–40)
Albumin: 4.6 g/dL (ref 3.8–4.8)
Alkaline Phosphatase: 96 IU/L (ref 44–121)
BUN/Creatinine Ratio: 12 (ref 12–28)
BUN: 13 mg/dL (ref 8–27)
Bilirubin Total: 0.6 mg/dL (ref 0.0–1.2)
CO2: 22 mmol/L (ref 20–29)
Calcium: 9.6 mg/dL (ref 8.7–10.3)
Chloride: 101 mmol/L (ref 96–106)
Creatinine, Ser: 1.07 mg/dL — ABNORMAL HIGH (ref 0.57–1.00)
Globulin, Total: 2.3 g/dL (ref 1.5–4.5)
Glucose: 102 mg/dL — ABNORMAL HIGH (ref 70–99)
Potassium: 4.7 mmol/L (ref 3.5–5.2)
Sodium: 140 mmol/L (ref 134–144)
Total Protein: 6.9 g/dL (ref 6.0–8.5)
eGFR: 54 mL/min/{1.73_m2} — ABNORMAL LOW (ref >59)

## 2023-10-09 LAB — CBC WITH DIFFERENTIAL/PLATELET
Basophils Absolute: 0.1 10*3/uL (ref 0.0–0.2)
Basos: 0 %
EOS (ABSOLUTE): 0.3 10*3/uL (ref 0.0–0.4)
Eos: 2 %
Hematocrit: 46.3 % (ref 34.0–46.6)
Hemoglobin: 15.8 g/dL (ref 11.1–15.9)
Immature Grans (Abs): 0 10*3/uL (ref 0.0–0.1)
Immature Granulocytes: 0 %
Lymphocytes Absolute: 2.4 10*3/uL (ref 0.7–3.1)
Lymphs: 18 %
MCH: 32.5 pg (ref 26.6–33.0)
MCHC: 34.1 g/dL (ref 31.5–35.7)
MCV: 95 fL (ref 79–97)
Monocytes Absolute: 1.4 10*3/uL — ABNORMAL HIGH (ref 0.1–0.9)
Monocytes: 11 %
Neutrophils Absolute: 9.5 10*3/uL — ABNORMAL HIGH (ref 1.4–7.0)
Neutrophils: 69 %
Platelets: 212 10*3/uL (ref 150–450)
RBC: 4.86 x10E6/uL (ref 3.77–5.28)
RDW: 12.4 % (ref 11.7–15.4)
WBC: 13.7 10*3/uL — ABNORMAL HIGH (ref 3.4–10.8)

## 2023-10-09 LAB — LIPID PANEL
Chol/HDL Ratio: 4 ratio (ref 0.0–4.4)
Cholesterol, Total: 188 mg/dL (ref 100–199)
HDL: 47 mg/dL (ref >39)
LDL Chol Calc (NIH): 111 mg/dL — ABNORMAL HIGH (ref 0–99)
Triglycerides: 172 mg/dL — ABNORMAL HIGH (ref 0–149)
VLDL Cholesterol Cal: 30 mg/dL (ref 5–40)

## 2023-10-09 LAB — TSH: TSH: 2.73 u[IU]/mL (ref 0.450–4.500)

## 2023-10-09 LAB — HEMOGLOBIN A1C
Est. average glucose Bld gHb Est-mCnc: 108 mg/dL
Hgb A1c MFr Bld: 5.4 % (ref 4.8–5.6)

## 2023-10-09 LAB — VITAMIN D 25 HYDROXY (VIT D DEFICIENCY, FRACTURES): Vit D, 25-Hydroxy: 38.5 ng/mL (ref 30.0–100.0)

## 2023-10-14 ENCOUNTER — Ambulatory Visit (INDEPENDENT_AMBULATORY_CARE_PROVIDER_SITE_OTHER)

## 2023-10-14 ENCOUNTER — Ambulatory Visit

## 2023-10-14 VITALS — BP 125/73 | HR 71 | Ht 59.0 in | Wt 109.1 lb

## 2023-10-14 DIAGNOSIS — K580 Irritable bowel syndrome with diarrhea: Secondary | ICD-10-CM

## 2023-10-14 DIAGNOSIS — F0630 Mood disorder due to known physiological condition, unspecified: Secondary | ICD-10-CM

## 2023-10-14 DIAGNOSIS — B001 Herpesviral vesicular dermatitis: Secondary | ICD-10-CM | POA: Diagnosis not present

## 2023-10-14 DIAGNOSIS — D72829 Elevated white blood cell count, unspecified: Secondary | ICD-10-CM | POA: Diagnosis not present

## 2023-10-14 DIAGNOSIS — G40909 Epilepsy, unspecified, not intractable, without status epilepticus: Secondary | ICD-10-CM

## 2023-10-14 DIAGNOSIS — F411 Generalized anxiety disorder: Secondary | ICD-10-CM

## 2023-10-14 DIAGNOSIS — E782 Mixed hyperlipidemia: Secondary | ICD-10-CM

## 2023-10-14 DIAGNOSIS — I10 Essential (primary) hypertension: Secondary | ICD-10-CM

## 2023-10-14 MED ORDER — VALACYCLOVIR HCL 500 MG PO TABS: ORAL_TABLET | ORAL | 3 refills | Status: AC

## 2023-10-14 NOTE — Assessment & Plan Note (Signed)
 Stable and improved.  Patient not currently taking cholestyramine  as she felt it was not working.  Diarrhea has improved and patient is not interested in following up with gastroenterology at this time.  Advised patient that if diarrhea worsens, I will place a new referral to gastroenterology for evaluation.

## 2023-10-14 NOTE — Assessment & Plan Note (Signed)
 BP goal <130/80.  Stable and at goal.  Continue atenolol  25 mg daily.  Will continue to monitor.

## 2023-10-14 NOTE — Assessment & Plan Note (Signed)
 Patient has significantly reduced her Xanax  use to only 0.5 mg tablet 5-6 nights per week for anxiety.  Patient is also on Ativan  1 mg twice daily per the recommendations of neurology given her history of seizure activity after suffering a hemorrhagic stroke.  Neurology manages this with lamotrigine  50 mg 2 times daily and the lorazepam .  At her last visit with Daryll Epp expressed concern over her chronic benzodiazepine use since 1992.  Britta Candy referred her to a psychiatrist to assist with benzodiazepine taper given patient's reluctance to taper at the time.  Per the psychiatrist note on August 21, 2023 the recommendation was to continue lamotrigine  50 mg twice a day, Ativan  1 mg twice daily as needed for seizure prevention, Xanax  0.5 mg nightly as needed for anxiety.  The psychiatrist noted that since these medications are not indicated for anxiety and she does not present with any significant mood symptoms, no psychiatric intervention is recommended at this time and to continue with current benzo dose for seizure prevention.  Will plan to continue to coordinate care with neurology per their recommendations given her history of seizures.

## 2023-10-14 NOTE — Assessment & Plan Note (Signed)
 Last week patient had an elevated white count at 13.7 with predominantly neutrophils.  Given that patient was asymptomatic at the time of blood draw, elevation likely due to asymptomatic infection at time of blood draw.  Will recheck CBC in 4 weeks to ensure that white count has normalized.

## 2023-10-14 NOTE — Assessment & Plan Note (Signed)
 Last lipid panel: LDL 111, HDL 47, triglycerides 172.  Lipid panel elevated from the last time it was checked.  Patient admitted she has missed several doses of her Zetia  over the last several weeks.  Encourage patient to try to take this medication daily as prescribed to prevent further worsening of her cholesterol.  Will plan to recheck lipid panel at her follow-up visit.

## 2023-10-14 NOTE — Patient Instructions (Signed)
 It was nice to see you today!  As we discussed in clinic:  -Continue all your current medications as prescribed. -I'd like to recheck your white blood cell count in 4-6 weeks to make sure that it has gone back down to normal -Keep your scheduled follow up with Neurology   -I will plan to see you back in 6 months for regular medication follow up!   If you have any problems before your next visit feel free to message me via MyChart (minor issues or questions) or call the office, otherwise you may reach out to schedule an office visit.  Thank you! Meryl Acosta, PA-C

## 2023-10-14 NOTE — Assessment & Plan Note (Signed)
 Continue routine follow-up with neurology.  Continue lamotrigine  50 mg twice daily.  After reviewing the note from psychiatry, their recommendation was to continue the lorazepam  1 mg twice daily as it is not used for anxiety, it is used for seizure prevention.  Will plan to continue coordinating care with neurology per their recommendations for medication management.

## 2023-10-14 NOTE — Progress Notes (Signed)
 Established Patient Office Visit  Subjective   Patient ID: Monica Collins, female    DOB: 11-19-1945  Age: 78 y.o. MRN: [id number]  Chief Complaint  Patient presents with   Hyperlipidemia   Hypertension    HPI  ALAYSSA Collins is a 78 y.o. female who presents to the clinic today for follow up on hypertension, hyperlipidemia, generalized anxiety disorder with Xanax  taper, IBS.  Hypertension: Patient currently taking atenolol  (2 tablets) for control of their blood pressure. Reports excellence compliance with this medication. Denies side effects or episodes of hypotension. Checks BP at home periodically. Denies CP, SOB, Palpitations, vision changes, HA, or edema.  Hyperlipidemia: Patient currently taking Zetia  daily for their cholesterol. Reports she has missed several doses of this medication, which she believes is the cause of her elevated cholesterol.  Denies side effects/new myalgias.    GAD: Patient has a history of generalized anxiety disorder and has been on alprazolam  since 1992.  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  They have been managing this with lamotrigine  and added lorazepam  0.5 mg tablet only at night for management of seizures to assist with decreasing Xanax  use.  It was documented by Britta Candy at her last visit that patient seemed reluctant to wean off due to fear of withdrawal seizures, so it was recommended that a Xanax  taper would be better supervised via psychiatry.  A referral was placed to psychiatry at that time for further management of anxiety and benzodiazepine taper.  Per chart review, patient was seen by psychiatry on August 21, 2023 and the recommendation was to continue lamotrigine  50 mg twice a day, Xanax  0.5 mg daily and lorazepam  1 mg twice a day for seizure prevention.  The psychiatrist also noted that since these medications are not indicated for anxiety and she does not present with any  significant mood symptoms, no psychiatric intervention is recommended at this time and to continue on with current benzo doses for seizure prevention.  Today patient reports that she is currently taking 0.5 mg Xanax  nightly as needed. Taking it 5-6 times per week. Also taking lorazepam  1 mg twice daily as prescribed by Neurology for seizure prevention.   IBS: Patient has a history of IBS and is currently taking 4 mg cholestyramine  daily to improve chronic diarrhea as she is status post cholecystectomy.  At her previous visit with Britta Candy in March, a referral was placed to gastroenterology for further management of her symptoms.  Today patient reports that her symptoms have improved greatly and she is not taking the cholestyramine  as she does not think it helped.  Of note patient reports that she has not had a seizure in at least 5 months.  She has a follow-up scheduled with neurology on December 15, 2023.  Leukocytosis: Patient had her blood work performed last week, her white blood cells were elevated at 13.7 with an elevation in predominantly neutrophils.  Patient reports that she does not believe she was feeling unwell at the time labs were drawn.  Denies fevers, congestion, skin infections, urinary symptoms.    ROS Per HPI.    Objective:     BP 125/73   Pulse 71   Ht 4' 11 (1.499 m)   Wt 109 lb 1.3 oz (49.5 kg)   SpO2 93%   BMI 22.03 kg/m    Physical Exam Constitutional:      General: She is not in acute distress.    Appearance: Normal  appearance.   Cardiovascular:     Rate and Rhythm: Normal rate and regular rhythm.     Heart sounds: Normal heart sounds. No murmur heard.    No friction rub. No gallop.  Pulmonary:     Effort: Pulmonary effort is normal. No respiratory distress.     Breath sounds: Normal breath sounds.  Abdominal:     General: Abdomen is flat.     Palpations: Abdomen is soft.     Tenderness: There is no abdominal tenderness.   Musculoskeletal:         General: No swelling.   Skin:    General: Skin is warm and dry.   Neurological:     General: No focal deficit present.     Mental Status: She is alert.   Psychiatric:        Mood and Affect: Mood normal.        Behavior: Behavior normal.        Thought Content: Thought content normal.    No results found for any visits on 10/14/23.  Last CBC Lab Results  Component Value Date   WBC 13.7 (H) 10/08/2023   HGB 15.8 10/08/2023   HCT 46.3 10/08/2023   MCV 95 10/08/2023   MCH 32.5 10/08/2023   RDW 12.4 10/08/2023   PLT 212 10/08/2023   Last metabolic panel Lab Results  Component Value Date   GLUCOSE 102 (H) 10/08/2023   NA 140 10/08/2023   K 4.7 10/08/2023   CL 101 10/08/2023   CO2 22 10/08/2023   BUN 13 10/08/2023   CREATININE 1.07 (H) 10/08/2023   EGFR 54 (L) 10/08/2023   CALCIUM 9.6 10/08/2023   PROT 6.9 10/08/2023   ALBUMIN 4.6 10/08/2023   LABGLOB 2.3 10/08/2023   AGRATIO 1.8 08/02/2021   BILITOT 0.6 10/08/2023   ALKPHOS 96 10/08/2023   AST 21 10/08/2023   ALT 16 10/08/2023   ANIONGAP 9 05/13/2017   Last lipids Lab Results  Component Value Date   CHOL 188 10/08/2023   HDL 47 10/08/2023   LDLCALC 111 (H) 10/08/2023   TRIG 172 (H) 10/08/2023   CHOLHDL 4.0 10/08/2023   Last hemoglobin A1c Lab Results  Component Value Date   HGBA1C 5.4 10/08/2023   Last thyroid  functions Lab Results  Component Value Date   TSH 2.730 10/08/2023      The ASCVD Risk score (Arnett DK, et al., 2019) failed to calculate for the following reasons:   Risk score cannot be calculated because patient has a medical history suggesting prior/existing ASCVD    Assessment & Plan:   Leukocytosis, unspecified type Assessment & Plan: Last week patient had an elevated white count at 13.7 with predominantly neutrophils.  Given that patient was asymptomatic at the time of blood draw, elevation likely due to asymptomatic infection at time of blood draw.  Will recheck CBC in 4 weeks  to ensure that white count has normalized.   Fever blister -     valACYclovir  HCl; Take 1 tablet po bid for 7 days for fever blister  Dispense: 14 tablet; Refill: 3  Seizure disorder [facility]) Assessment & Plan: Continue routine follow-up with neurology.  Continue lamotrigine  50 mg twice daily.  After reviewing the note from psychiatry, their recommendation was to continue the lorazepam  1 mg twice daily as it is not used for anxiety, it is used for seizure prevention.  Will plan to continue coordinating care with neurology per their recommendations for medication management.   Mixed hyperlipidemia Assessment &  Plan: Last lipid panel: LDL 111, HDL 47, triglycerides 172.  Lipid panel elevated from the last time it was checked.  Patient admitted she has missed several doses of her Zetia  over the last several weeks.  Encourage patient to try to take this medication daily as prescribed to prevent further worsening of her cholesterol.  Will plan to recheck lipid panel at her follow-up visit.   Irritable bowel syndrome with diarrhea Assessment & Plan: Stable and improved.  Patient not currently taking cholestyramine  as she felt it was not working.  Diarrhea has improved and patient is not interested in following up with gastroenterology at this time.  Advised patient that if diarrhea worsens, I will place a new referral to gastroenterology for evaluation.   Essential hypertension Assessment & Plan: BP goal <130/80.  Stable and at goal.  Continue atenolol  25 mg daily.  Will continue to monitor.   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  Has previously been as high as 10.  Mood is stable and improved.  Will continue to monitor.   Anxiety, generalized Assessment & Plan: Patient has significantly reduced her Xanax  use to only 0.5 mg tablet 5-6 nights per week for anxiety.  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  Neurology manages this with lamotrigine  50 mg 2 times daily and the lorazepam .  At her last visit with Daryll Epp expressed concern over her chronic benzodiazepine use since 1992.  Britta Candy referred her to a psychiatrist to assist with benzodiazepine taper given patient's reluctance to taper at the time.  Per the psychiatrist note on August 21, 2023 the recommendation was to continue lamotrigine  50 mg twice a day, Ativan  1 mg twice daily as needed for seizure prevention, Xanax  0.5 mg nightly as needed for anxiety.  The psychiatrist noted that since these medications are not indicated for anxiety and she does not present with any significant mood symptoms, no psychiatric intervention is recommended at this time and to continue with current benzo dose for seizure prevention.  Will plan to continue to coordinate care with neurology per their recommendations given her history of seizures.    Return in about 4 weeks (around 11/11/2023) for lab appointment only to recheck CBC .    Odilia Bennett, PA-C

## 2023-10-14 NOTE — Assessment & Plan Note (Signed)
 PHQ-9 score = 2.  Has previously been as high as 10.  Mood is stable and improved.  Will continue to monitor.

## 2023-11-11 ENCOUNTER — Other Ambulatory Visit: Payer: Self-pay | Admitting: Family Medicine

## 2023-11-11 DIAGNOSIS — G40909 Epilepsy, unspecified, not intractable, without status epilepticus: Secondary | ICD-10-CM

## 2023-11-11 DIAGNOSIS — F411 Generalized anxiety disorder: Secondary | ICD-10-CM

## 2023-11-11 NOTE — Telephone Encounter (Signed)
PDMP reviewed, no aberrancies

## 2023-11-11 NOTE — Telephone Encounter (Unsigned)
 Copied from CRM [id number]. Topic: Clinical - Medication Refill >> Nov 11, 2023  3:32 PM DeAngela L wrote: Medication: LORazepam  (ATIVAN ) 1 MG tablet   Has the patient contacted their pharmacy? Yes  (Agent: If no, request that the patient contact the pharmacy for the refill. If patient does not wish to contact the pharmacy document the reason why and proceed with request.) (Agent: If yes, when and what did the pharmacy advise?)  This is the patient's preferred pharmacy:  [id number] - [place], [place] - [address] [place] [address] Phone: [phone] Fax: [phone]  Is this the correct pharmacy for this prescription? Yes  If no, delete pharmacy and type the correct one.   Has the prescription been filled recently? Yes   Is the patient out of the medication? Yes   Has the patient been seen for an appointment in the last year OR does the patient have an upcoming appointment? Yes   Can we respond through MyChart? Sometimes   Agent: Please be advised that Rx refills may take up to 3 business days. We ask that you follow-up with your pharmacy.

## 2023-11-28 ENCOUNTER — Other Ambulatory Visit: Payer: Self-pay | Admitting: Family Medicine

## 2023-11-28 DIAGNOSIS — I10 Essential (primary) hypertension: Secondary | ICD-10-CM

## 2023-12-02 ENCOUNTER — Other Ambulatory Visit: Payer: Self-pay | Admitting: Family Medicine

## 2023-12-02 DIAGNOSIS — F411 Generalized anxiety disorder: Secondary | ICD-10-CM

## 2023-12-03 ENCOUNTER — Other Ambulatory Visit: Payer: Self-pay

## 2023-12-03 DIAGNOSIS — F411 Generalized anxiety disorder: Secondary | ICD-10-CM

## 2023-12-03 MED ORDER — ALPRAZOLAM 0.5 MG PO TABS: ORAL_TABLET | ORAL | 2 refills | Status: DC

## 2023-12-11 ENCOUNTER — Other Ambulatory Visit: Payer: Self-pay | Admitting: Family Medicine

## 2023-12-11 DIAGNOSIS — I700 Atherosclerosis of aorta: Secondary | ICD-10-CM

## 2023-12-11 NOTE — Progress Notes (Deleted)
 [facility]  PATIENT: Monica Collins DOB: 09-08-1945  REFERRING CLINICIAN: Wallace Joesph LABOR, PA HISTORY FROM: patient and husband REASON FOR VISIT: follow up   HISTORICAL  CHIEF COMPLAINT:  No chief complaint on file.   HISTORY OF PRESENT ILLNESS:   Update 12/15/2023 JM: Patient returns for yearly follow-up visit.  She has been stable without any recurrent seizure activity over the past year.  Reports compliance on lamotrigine  50 mg twice daily without side effects.  She also remains on Xanax  0.5 mg nightly and lorazepam  1 mg twice daily.  She was seen by psychiatry due to chronic use of benzodiazepines and further recommendations for tapering.  Per note review, previously reporting seizure when she missed dose of Xanax , she also noted having a seizure if she does not take lorazepam .  Reported seizure occurrence back in March due to missing Xanax  dosage.  She denied any current issues with anxiety or depression.  Per psychiatrist, tapering Xanax  or lorazepam  outside of scope due to recurrent seizures with missing dosages.      UPDATE (12/09/22, VRP): Since last visit, doing well, until 2 weeks ago, had 2 seizures. May have been late on meds. Also only on lamotrigine  25mg  twice a day.   UPDATE (11/20/21, VRP): Since last visit, doing well. Symptoms are stable. No seizures. Tolerating meds.  UPDATE (05/21/21, VRP): Since last visit, tried lamotrigine  for a while, then stopped b/c husband thought sz were worsening. But was tapering off benzos at that time also. Last sz ~05/12/21; now restarted lamotrigine  after that sz.  PRIOR HPI (11/14/20): 78 year old female here for evaluation of seizures.  2018 patient was being treated for stage II lung cancer and had screening MRI of the brain.  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  She underwent craniotomy and aneurysm clipping on 10/23/2016.  Patient had a postoperative stroke affecting right MCA region.   Patient then had clinical and electrographic seizures in the hospital.  Patient had confusion, staring spell, abnormal movements of her left arm and hand, grimacing and abnormal facial movements.  She was treated with Keppra .  Patient had side effects with Keppra  and this was changed to carbamazepine which also exacerbate his symptoms.  She stopped taking antiseizure medications.  Of note patient has been on Xanax  since 1992.  This is been used for anxiety and IBS.  Since she had seizure disorder she was managed with these benzodiazepines to help with seizure control.  Now patient on combination of Xanax  and Ativan .  Sometimes she misses a dose of Xanax  and usually has a breakthrough seizure.  This has been happening every 3 to 4 weeks.   REVIEW OF SYSTEMS: Full 14 system review of systems performed and negative with exception of: as per HPI.  ALLERGIES: Allergies  Allergen Reactions   Copper    Copper-Containing Compounds    Shellfish Allergy    Latex Rash    Rubber cleaning gloves used to clean home. But less reactive in recent years Rubber cleaning gloves used to clean home. But less reactive in recent years   Nickel Rash   Soap Rash    Detergent    HOME MEDICATIONS: Outpatient Medications Prior to Visit  Medication Sig Dispense Refill   ALPRAZolam  (XANAX ) 0.5 MG tablet TAKE 1 TABLET BY MOUTH EVERY DAY AT BEDTIME AS NEEDED 30 tablet 2   Ascorbic Acid (VITAMIN C PO) Take by mouth.     aspirin 81 MG chewable tablet Chew by mouth  daily.     atenolol  (TENORMIN ) 25 MG tablet TAKE 1 TO 2 TABLETS BY MOUTH DAILY FOR HIGH BLOOD PRESSURE 180 tablet 0   cholestyramine  light (PREVALITE ) 4 g packet TAKE 1 PACKET (4 G TOTAL) BY MOUTH DAILY. MAY INCREASE TO 1 PACKET 2 TIMES DAILY AFTER 7 DAYS. 180 packet 1   clobetasol  (TEMOVATE ) 0.05 % external solution Apply topically 2 (two) times daily.     ezetimibe  (ZETIA ) 10 MG tablet TAKE 1 TABLET BY MOUTH EVERY DAY 90 tablet 1   ketoconazole (NIZORAL) 2  % shampoo Apply topically 3 (three) times a week.     lamoTRIgine  (LAMICTAL ) 25 MG tablet Take 2 tablets (50 mg total) by mouth 2 (two) times daily. 360 tablet 4   LORazepam  (ATIVAN ) 1 MG tablet TAKE 1 TABLET BY MOUTH 2 TIMES DAILY AS NEEDED FOR ANXIETY. 60 tablet 1   valACYclovir  (VALTREX ) 500 MG tablet Take 1 tablet po bid for 7 days for fever blister 14 tablet 3   Facility-Administered Medications Prior to Visit  Medication Dose Route Frequency Provider Last Rate Last Admin   methylPREDNISolone  sodium succinate (SOLU-MEDROL ) 40 mg/mL injection 40 mg  40 mg Intramuscular Once Boscia, Heather E, NP        PAST MEDICAL HISTORY: Past Medical History:  Diagnosis Date   Actinic keratosis    Aneurysm (HCC) 10/2017   brain, [facility]   Eczema    Hypertension    Lung cancer (HCC)    Seizures (HCC)    last seizure 05/14/21   Stroke (HCC)     PAST SURGICAL HISTORY: Past Surgical History:  Procedure Laterality Date   CHOLECYSTECTOMY     LUNG CANCER SURGERY  2019    FAMILY HISTORY: Family History  Problem Relation Age of Onset   Alcohol abuse Father    Cancer Sister    Cancer Brother     SOCIAL HISTORY: Social History   Socioeconomic History   Marital status: Married    Spouse name: Lynwood   Number of children: 2   Years of education: Not on file   Highest education level: Not on file  Occupational History   Not on file  Tobacco Use   Smoking status: Some Days    Current packs/day: 0.00    Average packs/day: 1 pack/day for 50.0 years (50.0 ttl pk-yrs)    Types: Cigarettes    Start date: 10/22/1966    Last attempt to quit: 10/21/2016    Years since quitting: 7.1   Smokeless tobacco: Never   Tobacco comments:    11/20/21 pipe tobacco, 3/4 ppd ARJ 09/17/22  Vaping Use   Vaping status: Some Days  Substance and Sexual Activity   Alcohol use: No    Comment: once a year   Drug use: Never   Sexual activity: Not Currently    Birth control/protection: Post-menopausal  Other  Topics Concern   Not on file  Social History Narrative   Lives with husband    Social Drivers of Health   Financial Resource Strain: Low Risk  (03/25/2023)   Overall Financial Resource Strain (CARDIA)    Difficulty of Paying Living Expenses: Not hard at all  Food Insecurity: No Food Insecurity (03/25/2023)   Hunger Vital Sign    Worried About Running Out of Food in the Last Year: Never true    Ran Out of Food in the Last Year: Never true  Transportation Needs: No Transportation Needs (03/25/2023)   PRAPARE - Transportation    Lack of  Transportation (Medical): No    Lack of Transportation (Non-Medical): No  Physical Activity: Sufficiently Active (03/25/2023)   Exercise Vital Sign    Days of Exercise per Week: 7 days    Minutes of Exercise per Session: 30 min  Stress: No Stress Concern Present (03/25/2023)   [employer] of Occupational Health - Occupational Stress Questionnaire    Feeling of Stress : Not at all  Social Connections: Moderately Isolated (03/25/2023)   Social Connection and Isolation Panel    Frequency of Communication with Friends and Family: More than three times a week    Frequency of Social Gatherings with Friends and Family: More than three times a week    Attends Religious Services: Never    Database administrator or Organizations: No    Attends Banker Meetings: Never    Marital Status: Married  Catering manager Violence: Not At Risk (03/25/2023)   Humiliation, Afraid, Rape, and Kick questionnaire    Fear of Current or Ex-Partner: No    Emotionally Abused: No    Physically Abused: No    Sexually Abused: No     PHYSICAL EXAM  GENERAL EXAM/CONSTITUTIONAL: Vitals:  There were no vitals filed for this visit.  There is no height or weight on file to calculate BMI. Wt Readings from Last 3 Encounters:  10/14/23 109 lb 1.3 oz (49.5 kg)  06/30/23 108 lb (49 kg)  04/01/23 110 lb (49.9 kg)   Patient is in no distress; well developed,  nourished and groomed; neck is supple  CARDIOVASCULAR: Examination of carotid arteries is normal; no carotid bruits Regular rate and rhythm, no murmurs Examination of peripheral vascular system by observation and palpation is normal  EYES: Ophthalmoscopic exam of optic discs and posterior segments is normal; no papilledema or hemorrhages No results found.  MUSCULOSKELETAL: Gait, strength, tone, movements noted in Neurologic exam below  NEUROLOGIC: MENTAL STATUS:     03/21/2020    3:50 PM 02/16/2019    2:17 PM 02/12/2018   12:09 PM  MMSE - Mini Mental State Exam  Orientation to time 5 5 4   Orientation to Place 5 5 5   Registration 3 3 3   Attention/ Calculation 5 5 5   Recall 3 3 3   Language- name 2 objects 2 2 2   Language- repeat 1 1 1   Language- follow 3 step command 3 3 3   Language- read & follow direction 1 1 1   Write a sentence 1 1 1   Copy design 1 1 1   Total score 30 30 29    awake, alert, oriented to person, place and time recent and remote memory intact normal attention and concentration language fluent, comprehension intact, naming intact fund of knowledge appropriate  CRANIAL NERVE:  2nd - no papilledema on fundoscopic exam 2nd, 3rd, 4th, 6th - pupils equal and reactive to light, visual fields full to confrontation, extraocular muscles intact, no nystagmus 5th - facial sensation symmetric 7th - facial strength symmetric 8th - hearing intact 9th - palate elevates symmetrically, uvula midline 11th - shoulder shrug symmetric 12th - tongue protrusion midline  MOTOR:  normal bulk and tone, full strength in the BUE, BLE  SENSORY:  normal and symmetric to light touch, temperature, vibration  COORDINATION:  finger-nose-finger, fine finger movements normal  REFLEXES:  deep tendon reflexes present and symmetric  GAIT/STATION:  narrow based gait     DIAGNOSTIC DATA (LABS, IMAGING, TESTING) - I reviewed patient records, labs, notes, testing and imaging  myself where available.  Lab Results  Component Value Date   WBC 13.7 (H) 10/08/2023   HGB 15.8 10/08/2023   HCT 46.3 10/08/2023   MCV 95 10/08/2023   PLT 212 10/08/2023      Component Value Date/Time   NA 140 10/08/2023 1524   K 4.7 10/08/2023 1524   CL 101 10/08/2023 1524   CO2 22 10/08/2023 1524   GLUCOSE 102 (H) 10/08/2023 1524   GLUCOSE 97 05/13/2017 1951   BUN 13 10/08/2023 1524   CREATININE 1.07 (H) 10/08/2023 1524   CALCIUM 9.6 10/08/2023 1524   PROT 6.9 10/08/2023 1524   ALBUMIN 4.6 10/08/2023 1524   AST 21 10/08/2023 1524   ALT 16 10/08/2023 1524   ALKPHOS 96 10/08/2023 1524   BILITOT 0.6 10/08/2023 1524   GFRNONAA >60 05/13/2017 1951   GFRAA >60 05/13/2017 1951   Lab Results  Component Value Date   CHOL 188 10/08/2023   HDL 47 10/08/2023   LDLCALC 111 (H) 10/08/2023   TRIG 172 (H) 10/08/2023   CHOLHDL 4.0 10/08/2023   Lab Results  Component Value Date   HGBA1C 5.4 10/08/2023   No results found for: [id number] Lab Results  Component Value Date   TSH 2.730 10/08/2023     11/09/17 MRI brain / MRA head  - Sequela of prior right MCA aneurysm clip ligation. No residual or new aneurysm  identified. - Right frontotemporal and insular encephalomalacia and gliosis. Asymmetric atrophy of  the right hippocampal complex and amygdala.    ASSESSMENT AND PLAN  78 y.o. year old female here with:  Meds tried: levetiracetam , carbamazepine  Dx:  No diagnosis found.    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  No follow-ups on file.    I personally spent a total of *** minutes in the  care of the patient today including {Time Based Coding:[id number]}.  Harlene Bogaert, AGNP-BC  [facility] [address] [place], [place] [address]  Phone [phone] Fax [phone] Note: This document was prepared with digital dictation and possible smart phrase technology. Any transcriptional errors that result from this process are unintentional.

## 2023-12-15 ENCOUNTER — Telehealth: Payer: Self-pay | Admitting: Adult Health

## 2023-12-15 ENCOUNTER — Ambulatory Visit: Payer: Medicare HMO | Admitting: Adult Health

## 2023-12-15 NOTE — Telephone Encounter (Signed)
 LVM and sent MyChart msg informing pt of r/s needed for today's appt- Harlene out.

## 2023-12-31 ENCOUNTER — Other Ambulatory Visit: Payer: Self-pay

## 2023-12-31 DIAGNOSIS — G40909 Epilepsy, unspecified, not intractable, without status epilepticus: Secondary | ICD-10-CM

## 2023-12-31 DIAGNOSIS — F411 Generalized anxiety disorder: Secondary | ICD-10-CM

## 2024-01-07 ENCOUNTER — Ambulatory Visit: Admitting: Adult Health

## 2024-01-15 ENCOUNTER — Ambulatory Visit
Admission: RE | Admit: 2024-01-15 | Discharge: 2024-01-15 | Disposition: A | Discharge status: Home / Self Care | Source: Ambulatory Visit | Attending: Emergency Medicine | Admitting: Emergency Medicine

## 2024-01-15 DIAGNOSIS — R911 Solitary pulmonary nodule: Secondary | ICD-10-CM | POA: Insufficient documentation

## 2024-01-30 ENCOUNTER — Other Ambulatory Visit: Payer: Self-pay | Admitting: Diagnostic Neuroimaging

## 2024-01-30 DIAGNOSIS — G40909 Epilepsy, unspecified, not intractable, without status epilepticus: Secondary | ICD-10-CM

## 2024-02-02 DIAGNOSIS — R070 Pain in throat: Secondary | ICD-10-CM | POA: Diagnosis not present

## 2024-02-02 DIAGNOSIS — J069 Acute upper respiratory infection, unspecified: Secondary | ICD-10-CM | POA: Diagnosis not present

## 2024-02-10 ENCOUNTER — Ambulatory Visit (INDEPENDENT_AMBULATORY_CARE_PROVIDER_SITE_OTHER)

## 2024-02-10 ENCOUNTER — Ambulatory Visit
Admission: EM | Admit: 2024-02-10 | Discharge: 2024-02-10 | Disposition: A | Discharge status: Home / Self Care | Attending: Emergency Medicine | Admitting: Emergency Medicine

## 2024-02-10 DIAGNOSIS — R0602 Shortness of breath: Secondary | ICD-10-CM | POA: Insufficient documentation

## 2024-02-10 DIAGNOSIS — E860 Dehydration: Secondary | ICD-10-CM | POA: Insufficient documentation

## 2024-02-10 DIAGNOSIS — J984 Other disorders of lung: Secondary | ICD-10-CM | POA: Diagnosis not present

## 2024-02-10 DIAGNOSIS — H6991 Unspecified Eustachian tube disorder, right ear: Secondary | ICD-10-CM | POA: Diagnosis not present

## 2024-02-10 LAB — URINALYSIS, W/ REFLEX TO CULTURE (INFECTION SUSPECTED)
Glucose, UA: NEGATIVE mg/dL
Hgb urine dipstick: NEGATIVE
Ketones, ur: NEGATIVE mg/dL
Leukocytes,Ua: NEGATIVE
Nitrite: NEGATIVE
Protein, ur: 30 mg/dL — AB
Specific Gravity, Urine: >1.03 — ABNORMAL HIGH (ref 1.005–1.030)
pH: 5.5 (ref 5.0–8.0)

## 2024-02-10 LAB — CBC WITH DIFFERENTIAL/PLATELET
Abs Immature Granulocytes: 0.08 K/uL — ABNORMAL HIGH (ref 0.00–0.07)
Basophils Absolute: 0.1 K/uL (ref 0.0–0.1)
Basophils Relative: 0 %
Eosinophils Absolute: 0.8 K/uL — ABNORMAL HIGH (ref 0.0–0.5)
Eosinophils Relative: 7 %
HCT: 39.4 % (ref 36.0–46.0)
Hemoglobin: 13.7 g/dL (ref 12.0–15.0)
Immature Granulocytes: 1 %
Lymphocytes Relative: 15 %
Lymphs Abs: 1.7 K/uL (ref 0.7–4.0)
MCH: 30.7 pg (ref 26.0–34.0)
MCHC: 34.8 g/dL (ref 30.0–36.0)
MCV: 88.3 fL (ref 80.0–100.0)
Monocytes Absolute: 1.2 K/uL — ABNORMAL HIGH (ref 0.1–1.0)
Monocytes Relative: 10 %
Neutro Abs: 7.6 K/uL (ref 1.7–7.7)
Neutrophils Relative %: 67 %
Platelets: 234 K/uL (ref 150–400)
RBC: 4.46 MIL/uL (ref 3.87–5.11)
RDW: 12.3 % (ref 11.5–15.5)
Smear Review: NORMAL
WBC: 11.4 K/uL — ABNORMAL HIGH (ref 4.0–10.5)
nRBC: 0 % (ref 0.0–0.2)

## 2024-02-10 LAB — COMPREHENSIVE METABOLIC PANEL WITH GFR
ALT: 11 U/L (ref 0–44)
AST: 19 U/L (ref 15–41)
Albumin: 4 g/dL (ref 3.5–5.0)
Alkaline Phosphatase: 58 U/L (ref 38–126)
Anion gap: 11 (ref 5–15)
BUN: 13 mg/dL (ref 8–23)
CO2: 25 mmol/L (ref 22–32)
Calcium: 9.3 mg/dL (ref 8.9–10.3)
Chloride: 101 mmol/L (ref 98–111)
Creatinine, Ser: 1.08 mg/dL — ABNORMAL HIGH (ref 0.44–1.00)
GFR, Estimated: 53 mL/min — ABNORMAL LOW (ref >60)
Glucose, Bld: 98 mg/dL (ref 70–99)
Potassium: 3.5 mmol/L (ref 3.5–5.1)
Sodium: 137 mmol/L (ref 135–145)
Total Bilirubin: 0.9 mg/dL (ref 0.0–1.2)
Total Protein: 7.2 g/dL (ref 6.5–8.1)

## 2024-02-10 MED ORDER — METHYLPREDNISOLONE 4 MG PO TBPK
ORAL_TABLET | ORAL | 0 refills | Status: AC
Start: 2024-02-10 — End: ?

## 2024-02-10 MED ORDER — IPRATROPIUM BROMIDE 0.06 % NA SOLN: 2.0000 | Freq: Four times a day (QID) | NASAL | 12 refills | Status: AC

## 2024-02-10 NOTE — Discharge Instructions (Addendum)
 As we discussed, some of your symptoms may be coming from the fact that you are mildly dehydrated and you also may be experiencing eustachian tube dysfunction which could cause your ear pain and your sore throat.  Use the Atrovent nasal spray, 2 squirts up each nostril 4 times a day to help with decreasing inflammation of your upper respiratory tract and cutting down on some of your congestion.  Beginning tomorrow morning start taking the Medrol  Dosepak according to the package instructions.  This will decrease inflammation in your eustachian tube and help resolve the dysfunction.  Periodically throughout the day attempt to pop your ears using the best of maneuver as you were shown in the exam room.  You also need to increase your oral fluid intake to at least eight 8 ounce glasses of water daily to help improve your hydration which may help your symptoms.  If you do not have any improvement of your ear pain, sore throat, or fatigue with improving your hydration and treating the eustachian tube dysfunction you need to follow-up with your primary care provider.

## 2024-02-10 NOTE — ED Triage Notes (Signed)
 Pt presents with daughter for R ear pain and throat pain x 1 month. Also reports dizziness x 3.5 weeks.  Dizziness is an uneven feeling, imbalance. Has never been dx with vertigo.   Pt just finished augmentin for another visit - strep, covid, flu negative.   Pt tearful during intake.

## 2024-02-10 NOTE — ED Provider Notes (Signed)
 [facility]    CSN: [id number] Arrival date & time: 02/10/24  1207      History   Chief Complaint Chief Complaint  Patient presents with   Otalgia   Sore Throat   Dizziness    HPI Monica Collins is a 78 y.o. female.   HPI  78 year old female with a past medical history significant for brain aneurysm, hypertension, lung cancer, seizures, CVA, COPD, iron deficiency, and anxiety presents with her daughter for evaluation of decreased energy and fatigue along with right ear pain and pain on the right side of her throat that has been going on for the last month.  The patient does endorse decreased hearing but reports that that is not a new symptom.  She reports runny nose, nasal congestion, sore throat, diarrhea, and 1 episode of vomiting yesterday.  No fever, cough, chest pain, painful urination or urinary urgency or frequency.  Past Medical History:  Diagnosis Date   Actinic keratosis    Aneurysm 10/2017   brain, [facility]   Eczema    Hypertension    Lung cancer (HCC)    Seizures (HCC)    last seizure 05/14/21   Stroke [facility])     Patient Active Problem List   Diagnosis Date Noted   Leukocytosis 10/14/2023   Iron deficiency 10/20/2022   Muscle cramp 10/20/2022   Mixed hyperlipidemia 10/20/2022   Vitamin D  deficiency 10/20/2022   COPD (chronic obstructive pulmonary disease) (HCC) 09/17/2022   Pulmonary nodule 09/17/2022   Fever blister 08/06/2021   Neoplasm of uncertain behavior of skin of face 08/06/2021   Irritable bowel syndrome with diarrhea 03/13/2021   Rib pain on left side 02/06/2021   Memory deficit after cerebral infarction 02/06/2021   History of cancer of lower lobe bronchus or lung 08/07/2020   Neurological complaint 04/16/2020   Heart palpitations 01/06/2020   History of seizure 10/25/2019   Tobacco use disorder, continuous 02/16/2019   Restless legs syndrome (RLS) 10/22/2018   Atopic dermatitis 04/15/2018   Seizure disorder (HCC) 04/15/2018    Osteoarthritis 02/15/2018   Essential hypertension 01/07/2018   History of surgery for cerebral aneurysm 01/07/2018   Depressive disorder due to separate medical condition 01/07/2018   Anxiety, generalized 08/20/2017   Cerebral aneurysm 10/23/2016   Malignant neoplasm of lower lobe of left lung (HCC) 09/18/2016   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      Home Medications    Prior to Admission medications   Medication Sig Start Date End Date Taking? Authorizing Provider  Ascorbic Acid (VITAMIN C PO) Take by mouth.   Yes [provider]  aspirin 81 MG chewable tablet Chew by mouth daily.   Yes [provider]  ezetimibe  (ZETIA ) 10 MG tablet TAKE 1 TABLET BY MOUTH EVERY DAY 12/11/23  Yes Clapp, Kara F, PA-C  ipratropium (ATROVENT) 0.06 % nasal spray Place 2 sprays into both nostrils 4 (four) times daily. 02/10/24  Yes Bernardino Ditch, NP  lamoTRIgine  (LAMICTAL ) 25 MG tablet TAKE 2 TABLETS BY MOUTH 2 TIMES DAILY. 02/03/24  Yes Penumalli, Vikram R, MD  LORazepam  (ATIVAN ) 1 MG tablet TAKE 1 TABLET BY MOUTH TWICE A DAY AS NEEDED FOR ANXIETY 01/09/24  Yes Clapp, Kara F, PA-C  methylPREDNISolone  (MEDROL  DOSEPAK) 4 MG TBPK tablet  Take according to the package insert. 02/10/24  Yes Bernardino Ditch, NP  valACYclovir  (VALTREX ) 500 MG tablet Take 1 tablet po bid for 7 days for fever blister 10/14/23  Yes Clapp, Kara F, PA-C  ALPRAZolam  (XANAX ) 0.5 MG tablet TAKE 1 TABLET BY MOUTH EVERY DAY AT BEDTIME AS NEEDED 12/05/23   Gayle Numbers F, PA-C  atenolol  (TENORMIN ) 25 MG tablet TAKE 1 TO 2 TABLETS BY MOUTH DAILY FOR HIGH BLOOD PRESSURE 11/28/23   Gayle Numbers F, PA-C  clobetasol  (TEMOVATE ) 0.05 % external solution Apply topically 2 (two) times daily. 02/28/22   [provider]  ketoconazole (NIZORAL) 2 % shampoo  Apply topically 3 (three) times a week. 01/29/23   [provider]    Family History Family History  Problem Relation Age of Onset   Alcohol abuse Father    Cancer Sister    Cancer Brother     Social History Social History   Tobacco Use   Smoking status: Some Days    Current packs/day: 0.00    Average packs/day: 1 pack/day for 50.0 years (50.0 ttl pk-yrs)    Types: Cigarettes    Start date: 10/22/1966    Last attempt to quit: 10/21/2016    Years since quitting: 7.3   Smokeless tobacco: Never   Tobacco comments:    11/20/21 pipe tobacco, 3/4 ppd ARJ 09/17/22  Vaping Use   Vaping status: Some Days  Substance Use Topics   Alcohol use: No    Comment: once a year   Drug use: Never     Allergies   Copper, Copper-containing compounds, Shellfish allergy, Latex, Nickel, and Soap   Review of Systems Review of Systems  Constitutional:  Positive for fatigue. Negative for fever.  HENT:  Positive for congestion, ear pain, rhinorrhea and sore throat.   Respiratory:  Positive for shortness of breath. Negative for cough and wheezing.   Gastrointestinal:  Positive for diarrhea, nausea and vomiting.  Genitourinary:  Negative for dysuria, frequency and urgency.  Skin:  Negative for rash.  Neurological:  Positive for dizziness and weakness. Negative for syncope and headaches.     Physical Exam Triage Vital Signs ED Triage Vitals [02/10/24 1222]  Encounter Vitals Group     BP      Girls Systolic BP Percentile      Girls Diastolic BP Percentile      Boys Systolic BP Percentile      Boys Diastolic BP Percentile      Pulse      Resp      Temp      Temp src      SpO2      Weight      Height      Head Circumference      Peak Flow      Pain Score 7     Pain Loc      Pain Education      Exclude from Growth Chart    No data found.  Updated Vital Signs There were no vitals taken for this visit.  Visual Acuity Right Eye Distance:   Left Eye Distance:   Bilateral  Distance:    Right Eye Near:   Left Eye Near:    Bilateral Near:     Physical Exam Vitals and nursing note reviewed.  Constitutional:      Appearance: She is ill-appearing.  HENT:     Head: Normocephalic and atraumatic.     Right Ear: Tympanic membrane, ear canal and  external ear normal. There is no impacted cerumen.     Left Ear: Tympanic membrane, ear canal and external ear normal. There is no impacted cerumen.     Nose: Nose normal. No congestion or rhinorrhea.     Mouth/Throat:     Mouth: Mucous membranes are moist.     Pharynx: Oropharynx is clear. No oropharyngeal exudate or posterior oropharyngeal erythema.  Eyes:     Extraocular Movements: Extraocular movements intact.     Conjunctiva/sclera: Conjunctivae normal.     Pupils: Pupils are equal, round, and reactive to light.  Neck:     Comments: Patient reports tenderness with palpation of the right anterior neck.  No appreciable lymphadenopathy present. Cardiovascular:     Rate and Rhythm: Normal rate and regular rhythm.     Pulses: Normal pulses.     Heart sounds: Normal heart sounds. No murmur heard.    No friction rub. No gallop.  Pulmonary:     Effort: Pulmonary effort is normal.     Breath sounds: Normal breath sounds. No wheezing, rhonchi or rales.  Musculoskeletal:     Cervical back: Normal range of motion and neck supple. Tenderness present.  Lymphadenopathy:     Cervical: No cervical adenopathy.  Skin:    General: Skin is warm and dry.     Capillary Refill: Capillary refill takes less than 2 seconds.     Findings: No rash.  Neurological:     General: No focal deficit present.     Mental Status: She is alert and oriented to person, place, and time.      UC Treatments / Results  Labs (all labs ordered are listed, but only abnormal results are displayed) Labs Reviewed  CBC WITH DIFFERENTIAL/PLATELET - Abnormal; Notable for the following components:      Result Value   WBC 11.4 (*)    Monocytes  Absolute 1.2 (*)    Eosinophils Absolute 0.8 (*)    Abs Immature Granulocytes 0.08 (*)    All other components within normal limits  COMPREHENSIVE METABOLIC PANEL WITH GFR - Abnormal; Notable for the following components:   Creatinine, Ser 1.08 (*)    GFR, Estimated 53 (*)    All other components within normal limits  URINALYSIS, W/ REFLEX TO CULTURE (INFECTION SUSPECTED) - Abnormal; Notable for the following components:   APPearance HAZY (*)    Specific Gravity, Urine >1.030 (*)    Bilirubin Urine SMALL (*)    Protein, ur 30 (*)    Bacteria, UA FEW (*)    All other components within normal limits    EKG Sinus bradycardia with a ventricular rate of 56 bpm PR interval 192 ms QRS duration 80 ms QT/QTc 478/461 ms Wandering baseline with a rate of ST abnormality.  Radiology DG Chest 2 View Result Date: 02/10/2024 EXAM: 2 VIEW(S) XRAY OF THE CHEST 02/10/2024 01:31:13 PM COMPARISON: 01/15/2024 CLINICAL HISTORY: Shortness of breath. FINDINGS: LUNGS AND PLEURA: Left basilar scarring is noted. No focal pulmonary opacity. No pulmonary edema. No pleural effusion. No pneumothorax. HEART AND MEDIASTINUM: No acute abnormality of the cardiac and mediastinal silhouettes. BONES AND SOFT TISSUES: No acute osseous abnormality. IMPRESSION: 1. No acute cardiopulmonary disease. 2. Left basilar scarring. Electronically signed by: Lynwood Seip MD 02/10/2024 01:53 PM EDT RP Workstation: [id number]    Procedures Procedures (including critical care time)  Medications Ordered in UC Medications - No data to display  Initial Impression / Assessment and Plan / UC Course  I have reviewed the triage  vital signs and the nursing notes.  Pertinent labs & imaging results that were available during my care of the patient were reviewed by me and considered in my medical decision making (see chart for details).   Patient is a pleasant 78 year old female presenting with her daughter for evaluation of ongoing  respiratory symptoms with associated intermittent dizziness and significant fatigue present for the past month.  Also reports shortness of breath but denies any cough.  [facility] [place] and was diagnosed with an acute upper respiratory tract infection and prescribed a 10-day course of Augmentin.  At that time she had negative COVID, influenza, and RSV testing.  On exam the patient's tympanic membranes are pearly gray bilaterally with a normal light reflex and EACs are clear bilaterally.  No edema or erythema of nasal mucosa and no appreciable erythema or edema of the oropharyngeal mucosa.  No cervical lymphadenopathy present though the patient is complaining of tenderness with palpation of the anterior cervical region on the right-hand side.  Cardiopulmonary exam was S1-S2 heart sounds with regular rate and rhythm and lungs that are clear to auscultation in all fields.  The patient has developed some diarrhea and had 1 episode of vomiting but this is after starting the Augmentin and is most likely related to the Augmentin.  She is not endorsing any pain with urination or urgency or frequency.  Etiology of the patient's symptoms is unclear.  I will obtain a chest x-ray to evaluate for any potential cardiac sources of her fatigue, chest x-ray to look for any acute cardiopulmonary processes given her history of lung cancer and COPD, CBC and CMP to evaluate for any evidence of anemia or electrolyte imbalance which could explain the patient's symptoms.  Additionally, I will also order a urinalysis.  Chest x-ray independent reviewed and evaluated by me.  Impression: Sequela of left lower lobectomy present on chest x-ray.  Remainder of the lung fields are free of infiltrate or effusion.  Cardiomediastinal silhouette appears normal.  Radiology read is pending. Radiology impression states no acute abnormality.  Left basilar scarring.  Patient's EKG shows sinus bradycardia with  nonspecific ST abnormality.  When compared to the patient's EKG from 05/13/2017 there is no appreciable changes.  CBC shows a mildly elevated white count of 11.4.  H&H are normal at 13.7 and 39.4 and platelets are normal at 234.  No significant abnormalities to the differential.  BMP shows normal electrolytes.  Creatinine is mildly elevated at 1.08.  Transaminases are unremarkable.  The patient's creatinine is in line with what it has been since September of last year.  Urinalysis shows high specific gravity with small bilirubin and 30 protein.  Negative for leukocyte esterase or nitrites.  Urine shows skin contamination with 11-20 squamous epithelials, 6-10 WBCs, 0-5 RBCs, few bacteria, and calcium oxalate crystals are present.  I went and discussed the findings of the patient's blood work, chest x-ray, and EKG.  Given that she recently had an upper respiratory tract infection I did have her attempt to use the Valsalva maneuver to see if she could get her right ear to pop and clear and she reports that she was not able to.  Her right ear and right-sided neck pain may be secondary to eustachian tube dysfunction.  We will do a trial of Atrovent nasal spray and a Medrol  Dosepak to see if that improves her symptoms.  Some of her symptoms may also be related to mild dehydration  given the high specific gravity of her urine and the fact that she was having difficulty producing a urine specimen here in clinic.  I encouraged her to increase her oral fluid intake so that she helps improve hydration to see if her symptoms improve.   Final Clinical Impressions(s) / UC Diagnoses   Final diagnoses:  Shortness of breath  Dehydration  Eustachian tube dysfunction, right     Discharge Instructions      As we discussed, some of your symptoms may be coming from the fact that you are mildly dehydrated and you also may be experiencing eustachian tube dysfunction which could cause your ear pain and your sore  throat.  Use the Atrovent nasal spray, 2 squirts up each nostril 4 times a day to help with decreasing inflammation of your upper respiratory tract and cutting down on some of your congestion.  Beginning tomorrow morning start taking the Medrol  Dosepak according to the package instructions.  This will decrease inflammation in your eustachian tube and help resolve the dysfunction.  Periodically throughout the day attempt to pop your ears using the best of maneuver as you were shown in the exam room.  You also need to increase your oral fluid intake to at least eight 8 ounce glasses of water daily to help improve your hydration which may help your symptoms.  If you do not have any improvement of your ear pain, sore throat, or fatigue with improving your hydration and treating the eustachian tube dysfunction you need to follow-up with your primary care provider.     ED Prescriptions     Medication Sig Dispense Auth. Provider   methylPREDNISolone  (MEDROL  DOSEPAK) 4 MG TBPK tablet Take according to the package insert. 1 each Bernardino Ditch, NP   ipratropium (ATROVENT) 0.06 % nasal spray Place 2 sprays into both nostrils 4 (four) times daily. 15 mL Bernardino Ditch, NP      PDMP not reviewed this encounter.   Bernardino Ditch, NP 02/10/24 [phone]

## 2024-02-10 NOTE — ED Triage Notes (Signed)
 Pt also reports feeling no energy. Daughter states she is normally active but has been fatigued and often not even answering phone.

## 2024-02-19 ENCOUNTER — Ambulatory Visit: Admitting: Family Medicine

## 2024-02-28 ENCOUNTER — Other Ambulatory Visit: Payer: Self-pay

## 2024-02-28 DIAGNOSIS — I10 Essential (primary) hypertension: Secondary | ICD-10-CM

## 2024-03-01 ENCOUNTER — Other Ambulatory Visit: Payer: Self-pay

## 2024-03-01 DIAGNOSIS — F411 Generalized anxiety disorder: Secondary | ICD-10-CM

## 2024-03-01 NOTE — Telephone Encounter (Signed)
 Copied from CRM #[id number]. Topic: Clinical - Medication Refill >> Mar 01, 2024 12:12 PM Jasmin G wrote: Medication: ALPRAZolam  (XANAX ) 0.5 MG tablet  Has the patient contacted their pharmacy? No (Agent: If no, request that the patient contact the pharmacy for the refill. If patient does not wish to contact the pharmacy document the reason why and proceed with request.) (Agent: If yes, when and what did the pharmacy advise?)  This is the patient's preferred pharmacy:  [id number] - [place], [place] - [address] [place] [address] Phone: [phone] Fax: [phone]  Is this the correct pharmacy for this prescription? Yes If no, delete pharmacy and type the correct one.   Has the prescription been filled recently? Yes  Is the patient out of the medication? Yes  Has the patient been seen for an appointment in the last year OR does the patient have an upcoming appointment? Yes  Can we respond through MyChart? Yes  Agent: Please be advised that Rx refills may take up to 3 business days. We ask that you follow-up with your pharmacy.

## 2024-03-02 ENCOUNTER — Ambulatory Visit: Admitting: Adult Health

## 2024-03-02 NOTE — Progress Notes (Deleted)
 [facility]  PATIENT: Monica Collins DOB: 05-Jan-1946  REFERRING CLINICIAN: Wallace Joesph LABOR, PA HISTORY FROM: patient and husband REASON FOR VISIT: follow up   HISTORICAL  CHIEF COMPLAINT:  No chief complaint on file.   HISTORY OF PRESENT ILLNESS:   Update 03/02/2024 JM: Patient returns for follow-up visit after prior visit with Dr. Margaret over 1 year ago.  Lamotrigine  dosage increased to 50 mg twice daily at prior visit and denies any recurrent seizure activity.  She remains on Xanax  nightly as needed and lorazepam  1 mg twice daily which she reports using for seizure prevention.  As previously mentioned, she has been on Xanax  since 1992 for anxiety and IBS and reported breakthrough seizure if she misses a dose of either Xanax  or Ativan , she was seen by psychiatry back in April to assist with tapering off Xanax  but it was felt these medications were more being used for seizure prevention and tapering medication  outside the scope of this provider specialty.       UPDATE (12/09/22, VRP): Since last visit, doing well, until 2 weeks ago, had 2 seizures. May have been late on meds. Also only on lamotrigine  25mg  twice a day.   UPDATE (11/20/21, VRP): Since last visit, doing well. Symptoms are stable. No seizures. Tolerating meds.  UPDATE (05/21/21, VRP): Since last visit, tried lamotrigine  for a while, then stopped b/c husband thought sz were worsening. But was tapering off benzos at that time also. Last sz ~05/12/21; now restarted lamotrigine  after that sz.  PRIOR HPI (11/14/20): 78 year old female here for evaluation of seizures.  2018 patient was being treated for stage II lung cancer and had screening MRI of the brain.  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  She underwent craniotomy and aneurysm clipping on 10/23/2016.  Patient had a postoperative stroke affecting right MCA region.  Patient then had clinical and electrographic seizures in the  hospital.  Patient had confusion, staring spell, abnormal movements of her left arm and hand, grimacing and abnormal facial movements.  She was treated with Keppra .  Patient had side effects with Keppra  and this was changed to carbamazepine which also exacerbate his symptoms.  She stopped taking antiseizure medications.  Of note patient has been on Xanax  since 1992.  This is been used for anxiety and IBS.  Since she had seizure disorder she was managed with these benzodiazepines to help with seizure control.  Now patient on combination of Xanax  and Ativan .  Sometimes she misses a dose of Xanax  and usually has a breakthrough seizure.  This has been happening every 3 to 4 weeks.   REVIEW OF SYSTEMS: Full 14 system review of systems performed and negative with exception of: as per HPI.  ALLERGIES: Allergies  Allergen Reactions   Copper    Copper-Containing Compounds    Shellfish Allergy    Latex Rash    Rubber cleaning gloves used to clean home. But less reactive in recent years Rubber cleaning gloves used to clean home. But less reactive in recent years   Nickel Rash   Soap Rash    Detergent    HOME MEDICATIONS: Outpatient Medications Prior to Visit  Medication Sig Dispense Refill   ALPRAZolam  (XANAX ) 0.5 MG tablet TAKE 1 TABLET BY MOUTH EVERY DAY AT BEDTIME AS NEEDED 30 tablet 2   Ascorbic Acid (VITAMIN C PO) Take by mouth.     aspirin 81 MG chewable tablet Chew by mouth daily.     atenolol  (  TENORMIN ) 25 MG tablet TAKE 1 TO 2 TABLETS BY MOUTH DAILY FOR HIGH BLOOD PRESSURE 180 tablet 0   clobetasol  (TEMOVATE ) 0.05 % external solution Apply topically 2 (two) times daily.     ezetimibe  (ZETIA ) 10 MG tablet TAKE 1 TABLET BY MOUTH EVERY DAY 90 tablet 1   ipratropium (ATROVENT) 0.06 % nasal spray Place 2 sprays into both nostrils 4 (four) times daily. 15 mL 12   ketoconazole (NIZORAL) 2 % shampoo Apply topically 3 (three) times a week.     lamoTRIgine  (LAMICTAL ) 25 MG tablet TAKE 2 TABLETS  BY MOUTH 2 TIMES DAILY. 120 tablet 0   LORazepam  (ATIVAN ) 1 MG tablet TAKE 1 TABLET BY MOUTH TWICE A DAY AS NEEDED FOR ANXIETY 60 tablet 1   methylPREDNISolone  (MEDROL  DOSEPAK) 4 MG TBPK tablet Take according to the package insert. 1 each 0   valACYclovir  (VALTREX ) 500 MG tablet Take 1 tablet po bid for 7 days for fever blister 14 tablet 3   Facility-Administered Medications Prior to Visit  Medication Dose Route Frequency Provider Last Rate Last Admin   methylPREDNISolone  sodium succinate (SOLU-MEDROL ) 40 mg/mL injection 40 mg  40 mg Intramuscular Once Boscia, Heather E, NP        PAST MEDICAL HISTORY: Past Medical History:  Diagnosis Date   Actinic keratosis    Aneurysm 10/2017   brain, [facility]   Eczema    Hypertension    Lung cancer (HCC)    Seizures (HCC)    last seizure 05/14/21   Stroke (HCC)     PAST SURGICAL HISTORY: Past Surgical History:  Procedure Laterality Date   CHOLECYSTECTOMY     LUNG CANCER SURGERY  2019    FAMILY HISTORY: Family History  Problem Relation Age of Onset   Alcohol abuse Father    Cancer Sister    Cancer Brother     SOCIAL HISTORY: Social History   Socioeconomic History   Marital status: Married    Spouse name: Lynwood   Number of children: 2   Years of education: Not on file   Highest education level: Not on file  Occupational History   Not on file  Tobacco Use   Smoking status: Some Days    Current packs/day: 0.00    Average packs/day: 1 pack/day for 50.0 years (50.0 ttl pk-yrs)    Types: Cigarettes    Start date: 10/22/1966    Last attempt to quit: 10/21/2016    Years since quitting: 7.3   Smokeless tobacco: Never   Tobacco comments:    11/20/21 pipe tobacco, 3/4 ppd ARJ 09/17/22  Vaping Use   Vaping status: Some Days  Substance and Sexual Activity   Alcohol use: No    Comment: once a year   Drug use: Never   Sexual activity: Not Currently    Birth control/protection: Post-menopausal  Other Topics Concern   Not on file   Social History Narrative   Lives with husband    Social Drivers of Health   Financial Resource Strain: Low Risk  (03/25/2023)   Overall Financial Resource Strain (CARDIA)    Difficulty of Paying Living Expenses: Not hard at all  Food Insecurity: No Food Insecurity (03/25/2023)   Hunger Vital Sign    Worried About Running Out of Food in the Last Year: Never true    Ran Out of Food in the Last Year: Never true  Transportation Needs: No Transportation Needs (03/25/2023)   PRAPARE - Administrator, Civil Service (Medical): No  Lack of Transportation (Non-Medical): No  Physical Activity: Sufficiently Active (03/25/2023)   Exercise Vital Sign    Days of Exercise per Week: 7 days    Minutes of Exercise per Session: 30 min  Stress: No Stress Concern Present (03/25/2023)   [employer] of Occupational Health - Occupational Stress Questionnaire    Feeling of Stress : Not at all  Social Connections: Moderately Isolated (03/25/2023)   Social Connection and Isolation Panel    Frequency of Communication with Friends and Family: More than three times a week    Frequency of Social Gatherings with Friends and Family: More than three times a week    Attends Religious Services: Never    Database Administrator or Organizations: No    Attends Banker Meetings: Never    Marital Status: Married  Catering Manager Violence: Not At Risk (03/25/2023)   Humiliation, Afraid, Rape, and Kick questionnaire    Fear of Current or Ex-Partner: No    Emotionally Abused: No    Physically Abused: No    Sexually Abused: No     PHYSICAL EXAM  GENERAL EXAM/CONSTITUTIONAL: Vitals:  There were no vitals filed for this visit.  There is no height or weight on file to calculate BMI. Wt Readings from Last 3 Encounters:  10/14/23 109 lb 1.3 oz (49.5 kg)  06/30/23 108 lb (49 kg)  04/01/23 110 lb (49.9 kg)   Patient is in no distress; well developed, nourished and groomed; neck is  supple  CARDIOVASCULAR: Examination of carotid arteries is normal; no carotid bruits Regular rate and rhythm, no murmurs Examination of peripheral vascular system by observation and palpation is normal  EYES: Ophthalmoscopic exam of optic discs and posterior segments is normal; no papilledema or hemorrhages No results found.  MUSCULOSKELETAL: Gait, strength, tone, movements noted in Neurologic exam below  NEUROLOGIC: MENTAL STATUS:     03/21/2020    3:50 PM 02/16/2019    2:17 PM 02/12/2018   12:09 PM  MMSE - Mini Mental State Exam  Orientation to time 5 5 4   Orientation to Place 5 5 5   Registration 3 3 3   Attention/ Calculation 5 5 5   Recall 3 3 3   Language- name 2 objects 2 2 2   Language- repeat 1 1 1   Language- follow 3 step command 3 3 3   Language- read & follow direction 1 1 1   Write a sentence 1 1 1   Copy design 1 1 1   Total score 30 30 29    awake, alert, oriented to person, place and time recent and remote memory intact normal attention and concentration language fluent, comprehension intact, naming intact fund of knowledge appropriate  CRANIAL NERVE:  2nd - no papilledema on fundoscopic exam 2nd, 3rd, 4th, 6th - pupils equal and reactive to light, visual fields full to confrontation, extraocular muscles intact, no nystagmus 5th - facial sensation symmetric 7th - facial strength symmetric 8th - hearing intact 9th - palate elevates symmetrically, uvula midline 11th - shoulder shrug symmetric 12th - tongue protrusion midline  MOTOR:  normal bulk and tone, full strength in the BUE, BLE  SENSORY:  normal and symmetric to light touch, temperature, vibration  COORDINATION:  finger-nose-finger, fine finger movements normal  REFLEXES:  deep tendon reflexes present and symmetric  GAIT/STATION:  narrow based gait     DIAGNOSTIC DATA (LABS, IMAGING, TESTING) - I reviewed patient records, labs, notes, testing and imaging myself where available.  Lab  Results  Component Value Date  WBC 11.4 (H) 02/10/2024   HGB 13.7 02/10/2024   HCT 39.4 02/10/2024   MCV 88.3 02/10/2024   PLT 234 02/10/2024      Component Value Date/Time   NA 137 02/10/2024 1346   NA 140 10/08/2023 1524   K 3.5 02/10/2024 1346   CL 101 02/10/2024 1346   CO2 25 02/10/2024 1346   GLUCOSE 98 02/10/2024 1346   BUN 13 02/10/2024 1346   BUN 13 10/08/2023 1524   CREATININE 1.08 (H) 02/10/2024 1346   CALCIUM 9.3 02/10/2024 1346   PROT 7.2 02/10/2024 1346   PROT 6.9 10/08/2023 1524   ALBUMIN 4.0 02/10/2024 1346   ALBUMIN 4.6 10/08/2023 1524   AST 19 02/10/2024 1346   ALT 11 02/10/2024 1346   ALKPHOS 58 02/10/2024 1346   BILITOT 0.9 02/10/2024 1346   BILITOT 0.6 10/08/2023 1524   GFRNONAA 53 (L) 02/10/2024 1346   GFRAA >60 05/13/2017 1951   Lab Results  Component Value Date   CHOL 188 10/08/2023   HDL 47 10/08/2023   LDLCALC 111 (H) 10/08/2023   TRIG 172 (H) 10/08/2023   CHOLHDL 4.0 10/08/2023   Lab Results  Component Value Date   HGBA1C 5.4 10/08/2023   No results found for: [id number] Lab Results  Component Value Date   TSH 2.730 10/08/2023     11/09/17 MRI brain / MRA head  - Sequela of prior right MCA aneurysm clip ligation. No residual or new aneurysm  identified. - Right frontotemporal and insular encephalomalacia and gliosis. Asymmetric atrophy of  the right hippocampal complex and amygdala.    ASSESSMENT AND PLAN  78 y.o. year old female here with:  Meds tried: levetiracetam , carbamazepine  Dx:  No diagnosis found.    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  No  follow-ups on file.    I personally spent a total of *** minutes in the care of the patient today including {Time Based Coding:[id number]}.  Harlene Bogaert, AGNP-BC  [facility] [address] [place], [place] [address]  Phone [phone] Fax [phone] Note: This document was prepared with digital dictation and possible smart phrase technology. Any transcriptional errors that result from this process are unintentional.

## 2024-03-03 ENCOUNTER — Other Ambulatory Visit: Payer: Self-pay | Admitting: Diagnostic Neuroimaging

## 2024-03-03 DIAGNOSIS — G40909 Epilepsy, unspecified, not intractable, without status epilepticus: Secondary | ICD-10-CM

## 2024-03-05 ENCOUNTER — Other Ambulatory Visit: Payer: Self-pay

## 2024-03-05 DIAGNOSIS — F411 Generalized anxiety disorder: Secondary | ICD-10-CM

## 2024-03-05 DIAGNOSIS — G40909 Epilepsy, unspecified, not intractable, without status epilepticus: Secondary | ICD-10-CM

## 2024-03-05 NOTE — Telephone Encounter (Signed)
 Copied from CRM #[id number]. Topic: Clinical - Medication Refill >> Mar 05, 2024  4:38 PM Everette C wrote: Medication: LORazepam  (ATIVAN ) 1 MG tablet [[id number]]  Has the patient contacted their pharmacy? Yes (Agent: If no, request that the patient contact the pharmacy for the refill. If patient does not wish to contact the pharmacy document the reason why and proceed with request.) (Agent: If yes, when and what did the pharmacy advise?)  This is the patient's preferred pharmacy:  [id number] - [place], [place] - [address] [place] [address] Phone: [phone] Fax: [phone]   Is this the correct pharmacy for this prescription? Yes If no, delete pharmacy and type the correct one.   Has the prescription been filled recently? Yes  Is the patient out of the medication? Yes  Has the patient been seen for an appointment in the last year OR does the patient have an upcoming appointment? Yes  Can we respond through MyChart? No  Agent: Please be advised that Rx refills may take up to 3 business days. We ask that you follow-up with your pharmacy.

## 2024-03-30 ENCOUNTER — Encounter: Payer: Self-pay | Admitting: Adult Health

## 2024-03-30 ENCOUNTER — Ambulatory Visit: Admitting: Adult Health

## 2024-03-30 DIAGNOSIS — G40909 Epilepsy, unspecified, not intractable, without status epilepticus: Secondary | ICD-10-CM | POA: Diagnosis not present

## 2024-03-30 MED ORDER — LAMOTRIGINE 25 MG PO TABS: 50.0000 mg | ORAL_TABLET | Freq: Two times a day (BID) | ORAL | 3 refills | Status: AC

## 2024-03-30 NOTE — Progress Notes (Signed)
 [facility]  PATIENT: Monica Collins DOB: 1945/06/26  REFERRING CLINICIAN: Wallace Joesph LABOR, PA HISTORY FROM: patient and husband REASON FOR VISIT: follow up   HISTORICAL  CHIEF COMPLAINT:  Chief Complaint  Patient presents with   RM 3     Patient is here with husband for seizure follow-up - no concerns and no current episodes per her knowledge     HISTORY OF PRESENT ILLNESS:   Update 03/30/2024 JM: Patient returns for follow-up visit after prior visit with Dr. Margaret over 1 year ago.  Lamotrigine  dosage increased to 50 mg twice daily at prior visit and tolerating without side effects. She does need to take 2x 25mg  tablets due to difficulty swallowing 50mg  tablets. She remains on Xanax  nightly as needed and lorazepam  1 mg morning and afternoon which she reports using for seizure prevention. She did have a recurrent seizure about 1 month ago after forgetting to take lorazepam  that day.  As previously mentioned, she has been on Xanax  since 1992 for anxiety and IBS and reported breakthrough seizure if she misses a dose of either Xanax  or Ativan , she was seen by psychiatry back in April to assist with tapering off Xanax  but it was felt these medications were more being used for seizure prevention and tapering medication outside the scope of this provider specialty and recommended she continue.  Denies side effects of current medications and overall tolerating well.  Reports routine lab work with PCP which has been satisfactory.  No questions or concerns at this time.     UPDATE (12/09/22, VRP): Since last visit, doing well, until 2 weeks ago, had 2 seizures. May have been late on meds. Also only on lamotrigine  25mg  twice a day.   UPDATE (11/20/21, VRP): Since last visit, doing well. Symptoms are stable. No seizures. Tolerating meds.  UPDATE (05/21/21, VRP): Since last visit, tried lamotrigine  for a while, then stopped b/c husband thought sz were worsening. But was  tapering off benzos at that time also. Last sz ~05/12/21; now restarted lamotrigine  after that sz.  PRIOR HPI (11/14/20): 78 year old female here for evaluation of seizures.  2018 patient was being treated for stage II lung cancer and had screening MRI of the brain.  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  She underwent craniotomy and aneurysm clipping on 10/23/2016.  Patient had a postoperative stroke affecting right MCA region.  Patient then had clinical and electrographic seizures in the hospital.  Patient had confusion, staring spell, abnormal movements of her left arm and hand, grimacing and abnormal facial movements.  She was treated with Keppra .  Patient had side effects with Keppra  and this was changed to carbamazepine which also exacerbate his symptoms.  She stopped taking antiseizure medications.  Of note patient has been on Xanax  since 1992.  This is been used for anxiety and IBS.  Since she had seizure disorder she was managed with these benzodiazepines to help with seizure control.  Now patient on combination of Xanax  and Ativan .  Sometimes she misses a dose of Xanax  and usually has a breakthrough seizure.  This has been happening every 3 to 4 weeks.   REVIEW OF SYSTEMS: Full 14 system review of systems performed and negative with exception of: as per HPI.  ALLERGIES: Allergies  Allergen Reactions   Copper    Copper-Containing Compounds    Shellfish Allergy    Latex Rash    Rubber cleaning gloves used to clean home. But less reactive in recent  years Personal assistant used to clean home. But less reactive in recent years   Nickel Rash   Soap Rash    Detergent    HOME MEDICATIONS: Outpatient Medications Prior to Visit  Medication Sig Dispense Refill   ALPRAZolam  (XANAX ) 0.5 MG tablet TAKE 1 TABLET BY MOUTH EVERY DAY AT BEDTIME AS NEEDED 30 tablet 2   Ascorbic Acid (VITAMIN C PO) Take by mouth.     aspirin 81 MG chewable tablet Chew by mouth daily.      atenolol  (TENORMIN ) 25 MG tablet TAKE 1 TO 2 TABLETS BY MOUTH DAILY FOR HIGH BLOOD PRESSURE 180 tablet 0   clobetasol  (TEMOVATE ) 0.05 % external solution Apply topically 2 (two) times daily.     ezetimibe  (ZETIA ) 10 MG tablet TAKE 1 TABLET BY MOUTH EVERY DAY 90 tablet 1   ipratropium (ATROVENT ) 0.06 % nasal spray Place 2 sprays into both nostrils 4 (four) times daily. 15 mL 12   ketoconazole (NIZORAL) 2 % shampoo Apply topically 3 (three) times a week.     LORazepam  (ATIVAN ) 1 MG tablet TAKE 1 TABLET BY MOUTH TWICE A DAY AS NEEDED FOR ANXIETY 60 tablet 1   methylPREDNISolone  (MEDROL  DOSEPAK) 4 MG TBPK tablet Take according to the package insert. 1 each 0   valACYclovir  (VALTREX ) 500 MG tablet Take 1 tablet po bid for 7 days for fever blister 14 tablet 3   lamoTRIgine  (LAMICTAL ) 25 MG tablet Take 2 tablets (50 mg total) by mouth 2 (two) times daily. APPOINTMENT NEEDED FOR FURTHER REFILLS 120 tablet 0   Facility-Administered Medications Prior to Visit  Medication Dose Route Frequency Provider Last Rate Last Admin   methylPREDNISolone  sodium succinate (SOLU-MEDROL ) 40 mg/mL injection 40 mg  40 mg Intramuscular Once Boscia, Heather E, NP        PAST MEDICAL HISTORY: Past Medical History:  Diagnosis Date   Actinic keratosis    Aneurysm 10/2017   brain, [facility]   Eczema    Hypertension    Lung cancer (HCC)    Seizures (HCC)    last seizure 05/14/21   Stroke [facility])     PAST SURGICAL HISTORY: Past Surgical History:  Procedure Laterality Date   CHOLECYSTECTOMY     LUNG CANCER SURGERY  2019    FAMILY HISTORY: Family History  Problem Relation Age of Onset   Alcohol abuse Father    Cancer Sister    Cancer Brother    Stroke Neg Hx    Seizures Neg Hx    Migraines Neg Hx    Sleep apnea Neg Hx     SOCIAL HISTORY: Social History   Socioeconomic History   Marital status: Married    Spouse name: Lynwood   Number of children: 2   Years of education: Not on file   Highest education  level: Not on file  Occupational History   Not on file  Tobacco Use   Smoking status: Some Days    Current packs/day: 0.00    Average packs/day: 1 pack/day for 50.0 years (50.0 ttl pk-yrs)    Types: Cigarettes    Start date: 10/22/1966    Last attempt to quit: 10/21/2016    Years since quitting: 7.4   Smokeless tobacco: Never   Tobacco comments:    11/20/21 pipe tobacco, 3/4 ppd ARJ 09/17/22  Vaping Use   Vaping status: Some Days  Substance and Sexual Activity   Alcohol use: No    Comment: once a year   Drug use: Never  Sexual activity: Not Currently    Birth control/protection: Post-menopausal  Other Topics Concern   Not on file  Social History Narrative   Lives with husband    2 cups of coffee a week    Social Drivers of Corporate Investment Banker Strain: Low Risk  (03/25/2023)   Overall Financial Resource Strain (CARDIA)    Difficulty of Paying Living Expenses: Not hard at all  Food Insecurity: No Food Insecurity (03/25/2023)   Hunger Vital Sign    Worried About Running Out of Food in the Last Year: Never true    Ran Out of Food in the Last Year: Never true  Transportation Needs: No Transportation Needs (03/25/2023)   PRAPARE - Administrator, Civil Service (Medical): No    Lack of Transportation (Non-Medical): No  Physical Activity: Sufficiently Active (03/25/2023)   Exercise Vital Sign    Days of Exercise per Week: 7 days    Minutes of Exercise per Session: 30 min  Stress: No Stress Concern Present (03/25/2023)   [employer] of Occupational Health - Occupational Stress Questionnaire    Feeling of Stress : Not at all  Social Connections: Moderately Isolated (03/25/2023)   Social Connection and Isolation Panel    Frequency of Communication with Friends and Family: More than three times a week    Frequency of Social Gatherings with Friends and Family: More than three times a week    Attends Religious Services: Never    Database Administrator or  Organizations: No    Attends Banker Meetings: Never    Marital Status: Married  Catering Manager Violence: Not At Risk (03/25/2023)   Humiliation, Afraid, Rape, and Kick questionnaire    Fear of Current or Ex-Partner: No    Emotionally Abused: No    Physically Abused: No    Sexually Abused: No     PHYSICAL EXAM  GENERAL EXAM/CONSTITUTIONAL: Vitals:  Vitals:   03/30/24 1436  BP: (!) 136/52  Pulse: 65  Weight: 106 lb 9.6 oz (48.4 kg)  Height: 4' 11 (1.499 m)    Body mass index is 21.53 kg/m. Wt Readings from Last 3 Encounters:  03/30/24 106 lb 9.6 oz (48.4 kg)  10/14/23 109 lb 1.3 oz (49.5 kg)  06/30/23 108 lb (49 kg)   Patient is in no distress; well developed, nourished and groomed; neck is supple    MUSCULOSKELETAL: Gait, strength, tone, movements noted in Neurologic exam below  NEUROLOGIC: MENTAL STATUS:  awake, alert, oriented to person, place and time recent and remote memory intact normal attention and concentration language fluent, comprehension intact, naming intact fund of knowledge appropriate  CRANIAL NERVE:  2nd - no papilledema on fundoscopic exam 2nd, 3rd, 4th, 6th - pupils equal and reactive to light, visual fields full to confrontation, extraocular muscles intact, no nystagmus 5th - facial sensation symmetric 7th - facial strength symmetric 8th - hearing intact 9th - palate elevates symmetrically, uvula midline 11th - shoulder shrug symmetric 12th - tongue protrusion midline  MOTOR:  normal bulk and tone, full strength in the BUE, BLE  SENSORY:  normal and symmetric to light touch, temperature, vibration  COORDINATION:  finger-nose-finger, fine finger movements normal  REFLEXES:  deep tendon reflexes present and symmetric  GAIT/STATION:  narrow based gait     DIAGNOSTIC DATA (LABS, IMAGING, TESTING) - I reviewed patient records, labs, notes, testing and imaging myself where available.  Lab Results  Component  Value Date   WBC 11.4 (H)  02/10/2024   HGB 13.7 02/10/2024   HCT 39.4 02/10/2024   MCV 88.3 02/10/2024   PLT 234 02/10/2024      Component Value Date/Time   NA 137 02/10/2024 1346   NA 140 10/08/2023 1524   K 3.5 02/10/2024 1346   CL 101 02/10/2024 1346   CO2 25 02/10/2024 1346   GLUCOSE 98 02/10/2024 1346   BUN 13 02/10/2024 1346   BUN 13 10/08/2023 1524   CREATININE 1.08 (H) 02/10/2024 1346   CALCIUM 9.3 02/10/2024 1346   PROT 7.2 02/10/2024 1346   PROT 6.9 10/08/2023 1524   ALBUMIN 4.0 02/10/2024 1346   ALBUMIN 4.6 10/08/2023 1524   AST 19 02/10/2024 1346   ALT 11 02/10/2024 1346   ALKPHOS 58 02/10/2024 1346   BILITOT 0.9 02/10/2024 1346   BILITOT 0.6 10/08/2023 1524   GFRNONAA 53 (L) 02/10/2024 1346   GFRAA >60 05/13/2017 1951   Lab Results  Component Value Date   CHOL 188 10/08/2023   HDL 47 10/08/2023   LDLCALC 111 (H) 10/08/2023   TRIG 172 (H) 10/08/2023   CHOLHDL 4.0 10/08/2023   Lab Results  Component Value Date   HGBA1C 5.4 10/08/2023   No results found for: [id number] Lab Results  Component Value Date   TSH 2.730 10/08/2023     11/09/17 MRI brain / MRA head  - Sequela of prior right MCA aneurysm clip ligation. No residual or new aneurysm  identified. - Right frontotemporal and insular encephalomalacia and gliosis. Asymmetric atrophy of  the right hippocampal complex and amygdala.    ASSESSMENT AND PLAN  78 y.o. year old female here with:  Meds tried: levetiracetam , carbamazepine  Dx:  1. Seizure disorder (HCC)      PLAN:  RIGHT MCA STROKE --> COMPLEX PARTIAL SEIZURES (R frontal and R temporal lobe localization; last seizures ~05/12/21, Aug 2024 and 02/2024) - Continue lamotrigine  to 50mg  twice a day (requires 25mg  tablets due to difficulty swallowing 50 mg tablet) -(could not tolerate 100mg  twice a day dosing in the past due to side effects) -  Absence seizure occurred 1 month ago after missing lorazepam  dosage. Long term use of  lorazepam  and alprazolam  initially started for IBS and anxiety in the early 90s and has had recurrent seizures when trying to taper off or if she misses a dose.  She is aware of potential long-term effects of chronic benzodiazepine use.  Medications are currently being managed by PCP. - no driving (due to recurrent seizures)   Meds ordered this encounter  Medications   lamoTRIgine  (LAMICTAL ) 25 MG tablet    Sig: Take 2 tablets (50 mg total) by mouth 2 (two) times daily.    Dispense:  360 tablet    Refill:  3   Return in about 1 year (around 03/30/2025).    I personally spent a total of 25 minutes in the care of the patient today including preparing to see the patient, performing a medically appropriate exam/evaluation, counseling and educating, placing orders, and documenting clinical information in the EHR.  Harlene Bogaert, AGNP-BC  [facility] [address] [place], [place] [address]  Phone [phone] Fax [phone] Note: This document was prepared with digital dictation and possible smart phrase technology. Any transcriptional errors that result from this process are unintentional.

## 2024-03-30 NOTE — Patient Instructions (Signed)
 Your Plan:  Continue lamotrigine  50mg  twice daily for seizure prevention   Continue xanax  and ativan  at current dosages  Please call with any recurrent seizure activity      Follow up in 1 year or call earlier if needed      Thank you for coming to see us  at [facility]. I hope we have been able to provide you high quality care today.  You may receive a patient satisfaction survey over the next few weeks. We would appreciate your feedback and comments so that we may continue to improve ourselves and the health of our patients.

## 2024-04-14 ENCOUNTER — Ambulatory Visit (INDEPENDENT_AMBULATORY_CARE_PROVIDER_SITE_OTHER)

## 2024-04-14 VITALS — BP 117/53 | HR 53 | Temp 97.5°F | Wt 107.1 lb

## 2024-04-14 DIAGNOSIS — E782 Mixed hyperlipidemia: Secondary | ICD-10-CM | POA: Diagnosis not present

## 2024-04-14 DIAGNOSIS — I10 Essential (primary) hypertension: Secondary | ICD-10-CM

## 2024-04-14 DIAGNOSIS — Z87898 Personal history of other specified conditions: Secondary | ICD-10-CM

## 2024-04-14 DIAGNOSIS — K580 Irritable bowel syndrome with diarrhea: Secondary | ICD-10-CM | POA: Diagnosis not present

## 2024-04-14 DIAGNOSIS — F411 Generalized anxiety disorder: Secondary | ICD-10-CM | POA: Diagnosis not present

## 2024-04-14 DIAGNOSIS — Z85118 Personal history of other malignant neoplasm of bronchus and lung: Secondary | ICD-10-CM

## 2024-04-14 DIAGNOSIS — G40909 Epilepsy, unspecified, not intractable, without status epilepticus: Secondary | ICD-10-CM | POA: Diagnosis not present

## 2024-04-14 DIAGNOSIS — Z1231 Encounter for screening mammogram for malignant neoplasm of breast: Secondary | ICD-10-CM

## 2024-04-14 DIAGNOSIS — Z131 Encounter for screening for diabetes mellitus: Secondary | ICD-10-CM

## 2024-04-14 DIAGNOSIS — E559 Vitamin D deficiency, unspecified: Secondary | ICD-10-CM | POA: Diagnosis not present

## 2024-04-14 DIAGNOSIS — J449 Chronic obstructive pulmonary disease, unspecified: Secondary | ICD-10-CM

## 2024-04-14 MED ORDER — LORAZEPAM 1 MG PO TABS: 1.0000 mg | ORAL_TABLET | Freq: Two times a day (BID) | ORAL | 1 refills | Status: AC

## 2024-04-14 NOTE — Assessment & Plan Note (Signed)
 BP goal <130/80.  Stable and at goal.  Continue atenolol  25 mg daily.  Will continue to monitor.

## 2024-04-14 NOTE — Assessment & Plan Note (Signed)
 Patient has significantly reduced her Xanax  use to only 0.5 mg tablet 5-6 nights per week for anxiety. Patient is also on Ativan  1 mg twice daily per the recommendations of neurology given her history of seizure activity after suffering a hemorrhagic stroke. Neurology manages this with lamotrigine  50 mg 2 times daily and the lorazepam . At her last visit with Joesph Joesph expressed concern over her chronic benzodiazepine use since 1992. Joesph referred her to a psychiatrist to assist with benzodiazepine taper given patient's reluctance to taper at the time. Per the psychiatrist note on August 21, 2023 the recommendation was to continue lamotrigine  50 mg twice a day, Ativan  1 mg twice daily as needed for seizure prevention, Xanax  0.5 mg nightly as needed for anxiety. The psychiatrist noted that since these medications are not indicated for anxiety and she does not present with any significant mood symptoms, no psychiatric intervention is recommended at this time and to continue with current benzo dose for seizure prevention. Will plan to continue to coordinate care with neurology per their recommendations given her history of seizures. PDMP reviewed, no aberrancies. Scheduled refills sent to pharmacy

## 2024-04-14 NOTE — Assessment & Plan Note (Signed)
 Lung cancer screening UTD. Follows with pulmonology once a year

## 2024-04-14 NOTE — Progress Notes (Signed)
 Established Patient Office Visit  Subjective   Patient ID: Monica Collins, female    DOB: [birth year]  Age: 78 y.o. MRN: [id number]  Chief Complaint  Patient presents with   Pain Management    HPI  Discussed the use of AI scribe software for clinical note transcription with the patient, who gave verbal consent to proceed.  History of Present Illness   Monica Collins is a 78 year old female who presents for routine med check.  Gastrointestinal symptoms - Hx of IBS  - Frequent diarrhea and abdominal cramps alternating with periods of constipation - Symptoms are painful and significantly impact daily life - No recent colonoscopy performed - Diet includes fiber, primarily through food but not currently on any fiber supplements  - Denies blood in the stool   Medication management issues - Lorazepam  1 mg two times daily for seizure prevention - Alprazolam  0.5 mg at night for anxiety - Valacyclovir  as needed for cold sores - Lamictal  50 mg twice daily for seizure control - Ezetimibe  for cholesterol management - Atenolol  for blood pressure management  Blood pressure monitoring - Blood pressure readings at home are usually within normal range - Elevated blood pressure readings during medical visits - Regular home blood pressure monitoring          ROS Per HPI.    Objective:     BP (!) 117/53   Pulse (!) 53   Temp (!) 97.5 F (36.4 C) (Oral)   Wt 107 lb 1.9 oz (48.6 kg)   SpO2 97%   BMI 21.64 kg/m    Physical Exam Constitutional:      General: She is not in acute distress.    Appearance: Normal appearance.  Cardiovascular:     Rate and Rhythm: Normal rate and regular rhythm.     Heart sounds: Normal heart sounds. No murmur heard.    No friction rub. No gallop.  Pulmonary:     Effort: Pulmonary effort is normal. No respiratory distress.     Breath sounds: Normal breath sounds.  Musculoskeletal:        General: No swelling.  Skin:    General: Skin is  warm and dry.  Neurological:     General: No focal deficit present.     Mental Status: She is alert.  Psychiatric:        Mood and Affect: Mood normal.        Behavior: Behavior normal.        Thought Content: Thought content normal.      No results found for any visits on 04/14/24.    The ASCVD Risk score (Arnett DK, et al., 2019) failed to calculate for the following reasons:   Risk score cannot be calculated because patient has a medical history suggesting prior/existing ASCVD   * - Cholesterol units were assumed    Assessment & Plan:   Irritable bowel syndrome with diarrhea Assessment & Plan: Alternating bowel habits causing significant discomfort. No recent colonoscopy. - Referred to gastroenterology for evaluation and potential colonoscopy. - Recommended MiraLAX once daily. - Advised adding a fiber supplement to diet. - Patient not currently taking cholestyramine  as she felt it was not working.   Orders: -     Ambulatory referral to Gastroenterology  Anxiety, generalized Assessment & Plan: Patient has significantly reduced her Xanax  use to only 0.5 mg tablet 5-6 nights per week for anxiety. Patient is also on Ativan  1 mg twice daily per the recommendations of neurology given  her history of seizure activity after suffering a hemorrhagic stroke. Neurology manages this with lamotrigine  50 mg 2 times daily and the lorazepam . At her last visit with Joesph Joesph expressed concern over her chronic benzodiazepine use since 1992. Joesph referred her to a psychiatrist to assist with benzodiazepine taper given patient's reluctance to taper at the time. Per the psychiatrist note on August 21, 2023 the recommendation was to continue lamotrigine  50 mg twice a day, Ativan  1 mg twice daily as needed for seizure prevention, Xanax  0.5 mg nightly as needed for anxiety. The psychiatrist noted that since these medications are not indicated for anxiety and she does not present with any  significant mood symptoms, no psychiatric intervention is recommended at this time and to continue with current benzo dose for seizure prevention. Will plan to continue to coordinate care with neurology per their recommendations given her history of seizures. PDMP reviewed, no aberrancies. Scheduled refills sent to pharmacy   Orders: -     LORazepam ; Take 1 tablet (1 mg total) by mouth 2 (two) times daily.  Dispense: 60 tablet; Refill: 1  Seizure disorder [facility]) Assessment & Plan: Continue routine follow-up with neurology.  Continue lamotrigine  50 mg twice daily.  After reviewing the note from psychiatry, their recommendation was to continue the lorazepam  1 mg twice daily as it is not used for anxiety, it is used for seizure prevention.  Will plan to continue coordinating care with neurology per their recommendations for medication management.   Orders: -     LORazepam ; Take 1 tablet (1 mg total) by mouth 2 (two) times daily.  Dispense: 60 tablet; Refill: 1  Screening mammogram for breast cancer -     3D Screening Mammogram, Left and Right; Future  Essential hypertension Assessment & Plan: BP goal <130/80. Stable and at goal. Continue atenolol  25 mg daily. Will continue to monitor.   Orders: -     TSH; Future -     CBC with Differential/Platelet; Future  Mixed hyperlipidemia Assessment & Plan: Rechecking lipid panel with labs. Continue Zetia  10 mg daily for now.   Orders: -     Lipid panel; Future -     Comprehensive metabolic panel with GFR; Future  Vitamin D  deficiency -     VITAMIN D  25 Hydroxy (Vit-D Deficiency, Fractures); Future  Screening for diabetes mellitus -     Hemoglobin A1c; Future  Chronic obstructive pulmonary disease, unspecified COPD type (HCC) Assessment & Plan: Stable - following with pulmonology for management. No current related symptoms.    History of cancer of lower lobe bronchus or lung Assessment & Plan: Lung cancer screening UTD. Follows with  pulmonology once a year   History of seizure Assessment & Plan: Patient has significantly reduced her Xanax  use to only 0.5 mg tablet 5-6 nights per week for anxiety. Patient is also on Ativan  1 mg twice daily per the recommendations of neurology given her history of seizure activity after suffering a hemorrhagic stroke. Neurology manages this with lamotrigine  50 mg 2 times daily and the lorazepam . At her last visit with Joesph Joesph expressed concern over her chronic benzodiazepine use since 1992. Joesph referred her to a psychiatrist to assist with benzodiazepine taper given patient's reluctance to taper at the time. Per the psychiatrist note on August 21, 2023 the recommendation was to continue lamotrigine  50 mg twice a day, Ativan  1 mg twice daily as needed for seizure prevention, Xanax  0.5 mg nightly as needed for anxiety. The psychiatrist noted that since  these medications are not indicated for anxiety and she does not present with any significant mood symptoms, no psychiatric intervention is recommended at this time and to continue with current benzo dose for seizure prevention. Will plan to continue to coordinate care with neurology per their recommendations given her history of seizures.      Return in about 6 months (around 10/13/2024) for Med check.    Saddie JULIANNA Sacks, PA-C

## 2024-04-14 NOTE — Patient Instructions (Signed)
 VISIT SUMMARY: Today, you had a routine check-up to manage your gastrointestinal symptoms, medication schedule, and other ongoing health conditions. We discussed your symptoms, medication management, and necessary follow-ups.  YOUR PLAN: CHRONIC DIARRHEA AND CONSTIPATION: You have been experiencing alternating bowel habits causing significant discomfort. -You are referred to gastroenterology for evaluation and a potential colonoscopy. -Start taking MiraLAX once daily. -Add a fiber supplement to your diet.  GENERALIZED ANXIETY DISORDER: You are managing anxiety with alprazolam , but have had issues with the refill schedule. -We will coordinate medication refills for lorazepam  and alprazolam  on the same day. -A prescription for lorazepam  will be sent with a note for early refill if needed.  SEIZURE DISORDER: You are managing seizures with lorazepam  and alprazolam . -Continue your current medication regimen. -We will coordinate medication refills for a consistent supply.  ESSENTIAL HYPERTENSION: Your blood pressure is managed with atenolol , with good readings at home but elevated in the clinic. -We rechecked your blood pressure before you left the clinic. -Continue taking atenolol .  HYPERLIPIDEMIA: Your cholesterol is managed with ezetimibe . -Continue taking ezetimibe .  CHRONIC OBSTRUCTIVE PULMONARY DISEASE (COPD): You have COPD and are following up regularly with pulmonology. -Continue regular follow-ups with pulmonology. -Consider getting a pneumonia vaccination.  HISTORY OF LUNG CANCER: You are being monitored for lung cancer by pulmonology. -Continue regular follow-ups with pulmonology.  GENERAL HEALTH MAINTENANCE: Routine health maintenance was discussed, including vaccinations and screenings. -Your bone density and tetanus shot are current. -A mammogram has been ordered. -Consider getting shingles and pneumonia vaccinations.  If you have any problems before your next visit feel  free to message me via MyChart (minor issues or questions) or call the office, otherwise you may reach out to schedule an office visit.  Thank you! Saddie Sacks, PA-C

## 2024-04-14 NOTE — Assessment & Plan Note (Signed)
 Alternating bowel habits causing significant discomfort. No recent colonoscopy. - Referred to gastroenterology for evaluation and potential colonoscopy. - Recommended MiraLAX once daily. - Advised adding a fiber supplement to diet. - Patient not currently taking cholestyramine  as she felt it was not working.

## 2024-04-14 NOTE — Assessment & Plan Note (Signed)
 Continue routine follow-up with neurology.  Continue lamotrigine  50 mg twice daily.  After reviewing the note from psychiatry, their recommendation was to continue the lorazepam  1 mg twice daily as it is not used for anxiety, it is used for seizure prevention.  Will plan to continue coordinating care with neurology per their recommendations for medication management.

## 2024-04-14 NOTE — Assessment & Plan Note (Signed)
 Rechecking lipid panel with labs. Continue Zetia  10 mg daily for now.

## 2024-04-14 NOTE — Assessment & Plan Note (Signed)
 Stable - following with pulmonology for management. No current related symptoms.

## 2024-04-14 NOTE — Assessment & Plan Note (Signed)
 Patient has significantly reduced her Xanax  use to only 0.5 mg tablet 5-6 nights per week for anxiety.  Patient is also on Ativan  1 mg twice daily per the recommendations of neurology given her history of seizure activity after suffering a hemorrhagic stroke.  Neurology manages this with lamotrigine  50 mg 2 times daily and the lorazepam .  At her last visit with Daryll Epp expressed concern over her chronic benzodiazepine use since 1992.  Britta Candy referred her to a psychiatrist to assist with benzodiazepine taper given patient's reluctance to taper at the time.  Per the psychiatrist note on August 21, 2023 the recommendation was to continue lamotrigine  50 mg twice a day, Ativan  1 mg twice daily as needed for seizure prevention, Xanax  0.5 mg nightly as needed for anxiety.  The psychiatrist noted that since these medications are not indicated for anxiety and she does not present with any significant mood symptoms, no psychiatric intervention is recommended at this time and to continue with current benzo dose for seizure prevention.  Will plan to continue to coordinate care with neurology per their recommendations given her history of seizures.

## 2024-05-11 ENCOUNTER — Ambulatory Visit

## 2024-05-12 LAB — CBC WITH DIFFERENTIAL/PLATELET
Basophils Absolute: 0 x10E3/uL (ref 0.0–0.2)
Basos: 1 %
EOS (ABSOLUTE): 0.2 x10E3/uL (ref 0.0–0.4)
Eos: 3 %
Hematocrit: 46.3 % (ref 34.0–46.6)
Hemoglobin: 15.2 g/dL (ref 11.1–15.9)
Immature Grans (Abs): 0 x10E3/uL (ref 0.0–0.1)
Immature Granulocytes: 0 %
Lymphocytes Absolute: 2.1 x10E3/uL (ref 0.7–3.1)
Lymphs: 26 %
MCH: 30.5 pg (ref 26.6–33.0)
MCHC: 32.8 g/dL (ref 31.5–35.7)
MCV: 93 fL (ref 79–97)
Monocytes Absolute: 0.7 x10E3/uL (ref 0.1–0.9)
Monocytes: 9 %
Neutrophils Absolute: 5 x10E3/uL (ref 1.4–7.0)
Neutrophils: 61 %
Platelets: 169 x10E3/uL (ref 150–450)
RBC: 4.99 x10E6/uL (ref 3.77–5.28)
RDW: 12.5 % (ref 11.7–15.4)
WBC: 8.1 x10E3/uL (ref 3.4–10.8)

## 2024-05-12 LAB — LIPID PANEL
Chol/HDL Ratio: 3 ratio (ref 0.0–4.4)
Cholesterol, Total: 154 mg/dL (ref 100–199)
HDL: 52 mg/dL
LDL Chol Calc (NIH): 75 mg/dL (ref 0–99)
Triglycerides: 159 mg/dL — ABNORMAL HIGH (ref 0–149)
VLDL Cholesterol Cal: 27 mg/dL (ref 5–40)

## 2024-05-12 LAB — HEMOGLOBIN A1C
Est. average glucose Bld gHb Est-mCnc: 108 mg/dL
Hgb A1c MFr Bld: 5.4 % (ref 4.8–5.6)

## 2024-05-12 LAB — COMPREHENSIVE METABOLIC PANEL WITH GFR
ALT: 10 IU/L (ref 0–32)
AST: 15 IU/L (ref 0–40)
Albumin: 4.4 g/dL (ref 3.8–4.8)
Alkaline Phosphatase: 102 IU/L (ref 49–135)
BUN/Creatinine Ratio: 9 — ABNORMAL LOW (ref 12–28)
BUN: 9 mg/dL (ref 8–27)
Bilirubin Total: 0.5 mg/dL (ref 0.0–1.2)
CO2: 26 mmol/L (ref 20–29)
Calcium: 9.2 mg/dL (ref 8.7–10.3)
Chloride: 102 mmol/L (ref 96–106)
Creatinine, Ser: 1.02 mg/dL — ABNORMAL HIGH (ref 0.57–1.00)
Globulin, Total: 1.9 g/dL (ref 1.5–4.5)
Glucose: 87 mg/dL (ref 70–99)
Potassium: 3.8 mmol/L (ref 3.5–5.2)
Sodium: 142 mmol/L (ref 134–144)
Total Protein: 6.3 g/dL (ref 6.0–8.5)
eGFR: 57 mL/min/1.73 — ABNORMAL LOW

## 2024-05-12 LAB — TSH: TSH: 2.51 u[IU]/mL (ref 0.450–4.500)

## 2024-05-12 LAB — VITAMIN D 25 HYDROXY (VIT D DEFICIENCY, FRACTURES): Vit D, 25-Hydroxy: 28.8 ng/mL — ABNORMAL LOW (ref 30.0–100.0)

## 2024-05-13 ENCOUNTER — Ambulatory Visit: Payer: Self-pay

## 2024-05-26 ENCOUNTER — Other Ambulatory Visit: Payer: Self-pay

## 2024-05-26 DIAGNOSIS — F411 Generalized anxiety disorder: Secondary | ICD-10-CM

## 2024-06-08 ENCOUNTER — Encounter

## 2024-10-13 ENCOUNTER — Ambulatory Visit

## 2025-04-04 ENCOUNTER — Ambulatory Visit: Admitting: Adult Health
